# Patient Record
Sex: Male | Born: 1940 | Race: White | Hispanic: No | Marital: Married | Smoking: Former smoker
Health system: Southern US, Community
[De-identification: ages and names within clinical notes are randomized; demographics above are authoritative.]

## PROBLEM LIST (undated history)

## (undated) DIAGNOSIS — E785 Hyperlipidemia, unspecified: Secondary | ICD-10-CM

## (undated) DIAGNOSIS — I2510 Atherosclerotic heart disease of native coronary artery without angina pectoris: Secondary | ICD-10-CM

## (undated) DIAGNOSIS — R51 Headache: Secondary | ICD-10-CM

## (undated) DIAGNOSIS — I739 Peripheral vascular disease, unspecified: Secondary | ICD-10-CM

## (undated) DIAGNOSIS — I214 Non-ST elevation (NSTEMI) myocardial infarction: Secondary | ICD-10-CM

## (undated) DIAGNOSIS — R7303 Prediabetes: Secondary | ICD-10-CM

## (undated) DIAGNOSIS — C679 Malignant neoplasm of bladder, unspecified: Secondary | ICD-10-CM

## (undated) DIAGNOSIS — I10 Essential (primary) hypertension: Secondary | ICD-10-CM

## (undated) DIAGNOSIS — F32A Depression, unspecified: Secondary | ICD-10-CM

## (undated) DIAGNOSIS — D126 Benign neoplasm of colon, unspecified: Secondary | ICD-10-CM

## (undated) DIAGNOSIS — E039 Hypothyroidism, unspecified: Secondary | ICD-10-CM

## (undated) DIAGNOSIS — K635 Polyp of colon: Secondary | ICD-10-CM

## (undated) DIAGNOSIS — R519 Headache, unspecified: Secondary | ICD-10-CM

## (undated) DIAGNOSIS — I25810 Atherosclerosis of coronary artery bypass graft(s) without angina pectoris: Secondary | ICD-10-CM

## (undated) DIAGNOSIS — F329 Major depressive disorder, single episode, unspecified: Secondary | ICD-10-CM

## (undated) HISTORY — PX: COLONOSCOPY: SHX174

## (undated) HISTORY — PX: ANGIOPLASTY: SHX39

---

## 1990-09-27 HISTORY — PX: LEFT HEART CATH: SHX5946

## 2010-03-09 ENCOUNTER — Ambulatory Visit: Payer: Self-pay | Admitting: Gastroenterology

## 2011-03-05 ENCOUNTER — Ambulatory Visit: Payer: Self-pay | Admitting: Internal Medicine

## 2011-03-10 ENCOUNTER — Ambulatory Visit: Payer: Self-pay | Admitting: Internal Medicine

## 2015-05-20 ENCOUNTER — Encounter
Admission: RE | Disposition: A | Discharge status: Home / Self Care | Payer: Self-pay | Source: Ambulatory Visit | Attending: Gastroenterology

## 2015-05-20 ENCOUNTER — Ambulatory Visit: Payer: Medicare Other | Admitting: Anesthesiology

## 2015-05-20 ENCOUNTER — Encounter: Payer: Self-pay | Admitting: *Deleted

## 2015-05-20 ENCOUNTER — Ambulatory Visit
Admission: RE | Admit: 2015-05-20 | Discharge: 2015-05-20 | Disposition: A | Discharge status: Home / Self Care | Payer: Medicare Other | Source: Ambulatory Visit | Attending: Gastroenterology | Admitting: Gastroenterology

## 2015-05-20 DIAGNOSIS — K5730 Diverticulosis of large intestine without perforation or abscess without bleeding: Secondary | ICD-10-CM | POA: Diagnosis not present

## 2015-05-20 DIAGNOSIS — Z955 Presence of coronary angioplasty implant and graft: Secondary | ICD-10-CM | POA: Insufficient documentation

## 2015-05-20 DIAGNOSIS — F329 Major depressive disorder, single episode, unspecified: Secondary | ICD-10-CM | POA: Diagnosis not present

## 2015-05-20 DIAGNOSIS — I2510 Atherosclerotic heart disease of native coronary artery without angina pectoris: Secondary | ICD-10-CM | POA: Insufficient documentation

## 2015-05-20 DIAGNOSIS — D123 Benign neoplasm of transverse colon: Secondary | ICD-10-CM | POA: Insufficient documentation

## 2015-05-20 DIAGNOSIS — Z86010 Personal history of colonic polyps: Secondary | ICD-10-CM | POA: Insufficient documentation

## 2015-05-20 DIAGNOSIS — E785 Hyperlipidemia, unspecified: Secondary | ICD-10-CM | POA: Diagnosis not present

## 2015-05-20 DIAGNOSIS — D122 Benign neoplasm of ascending colon: Secondary | ICD-10-CM | POA: Diagnosis not present

## 2015-05-20 DIAGNOSIS — Z881 Allergy status to other antibiotic agents status: Secondary | ICD-10-CM | POA: Insufficient documentation

## 2015-05-20 DIAGNOSIS — K648 Other hemorrhoids: Secondary | ICD-10-CM | POA: Insufficient documentation

## 2015-05-20 DIAGNOSIS — Z79899 Other long term (current) drug therapy: Secondary | ICD-10-CM | POA: Insufficient documentation

## 2015-05-20 DIAGNOSIS — I10 Essential (primary) hypertension: Secondary | ICD-10-CM | POA: Diagnosis not present

## 2015-05-20 HISTORY — DX: Atherosclerotic heart disease of native coronary artery without angina pectoris: I25.10

## 2015-05-20 HISTORY — DX: Depression, unspecified: F32.A

## 2015-05-20 HISTORY — DX: Hyperlipidemia, unspecified: E78.5

## 2015-05-20 HISTORY — DX: Headache: R51

## 2015-05-20 HISTORY — DX: Headache, unspecified: R51.9

## 2015-05-20 HISTORY — DX: Major depressive disorder, single episode, unspecified: F32.9

## 2015-05-20 HISTORY — PX: COLONOSCOPY WITH PROPOFOL: SHX5780

## 2015-05-20 HISTORY — DX: Essential (primary) hypertension: I10

## 2015-05-20 HISTORY — DX: Polyp of colon: K63.5

## 2015-05-20 SURGERY — COLONOSCOPY WITH PROPOFOL
Anesthesia: General

## 2015-05-20 MED ORDER — SODIUM CHLORIDE 0.9 % IV SOLN
INTRAVENOUS | Status: DC
Start: 2015-05-20 — End: 2015-05-20

## 2015-05-20 MED ORDER — PROPOFOL 10 MG/ML IV BOLUS
INTRAVENOUS | Status: DC | PRN
  Administered 2015-05-20: 100 mg via INTRAVENOUS

## 2015-05-20 MED ORDER — SODIUM CHLORIDE 0.9 % IV SOLN
INTRAVENOUS | Status: DC
  Administered 2015-05-20: 1000 mL via INTRAVENOUS

## 2015-05-20 MED ORDER — PROPOFOL INFUSION 10 MG/ML OPTIME
INTRAVENOUS | Status: DC | PRN
  Administered 2015-05-20: 100 ug/kg/min via INTRAVENOUS

## 2015-05-20 MED ORDER — LIDOCAINE HCL (PF) 2 % IJ SOLN
INTRAMUSCULAR | Status: DC | PRN
  Administered 2015-05-20: 20 mg via INTRADERMAL

## 2015-05-20 NOTE — Transfer of Care (Signed)
Immediate Anesthesia Transfer of Care Note  Patient: Andrew Richardson  Procedure(s) Performed: Procedure(s): COLONOSCOPY WITH PROPOFOL (N/A)  Patient Location: Endoscopy Unit  Anesthesia Type:General  Level of Consciousness: sedated  Airway & Oxygen Therapy: Patient Spontanous Breathing and Patient connected to nasal cannula oxygen  Post-op Assessment: Report given to RN and Post -op Vital signs reviewed and stable  Post vital signs: Reviewed and stable  Last Vitals:  Filed Vitals:   05/20/15 1250  BP: 167/75  Pulse: 48  Temp: 36.1 C  Resp: 22    Complications: No apparent anesthesia complications

## 2015-05-20 NOTE — H&P (Signed)
Outpatient short stay form Pre-procedure 05/20/2015 1:02 PM Lollie Sails MD  Primary Physician: Dr. Baldemar Lenis  Reason for visit:  Colonoscopy  History of present illness:  Patient is a 74 year old male with a personal history of adenomatous colon polyps. His last colonoscopy was in 2011 with finding of 4 polyps. He tolerated his prep well overnight. He takes no aspirin products he takes no anticoagulation medications.  He states he does have a small left-sided inguinal hernia occasionally Trude and given some discomfort. He states he is planning getting that repaired this winter. It is currently reduced.    Current facility-administered medications:  .  0.9 %  sodium chloride infusion, , Intravenous, Continuous, Lollie Sails, MD .  0.9 %  sodium chloride infusion, , Intravenous, Continuous, Lollie Sails, MD  Prescriptions prior to admission  Medication Sig Dispense Refill Last Dose  . losartan-hydrochlorothiazide (HYZAAR) 100-25 MG per tablet Take 1 tablet by mouth daily.   05/19/2015 at Unknown time  . metoprolol (LOPRESSOR) 50 MG tablet Take 50 mg by mouth daily.   05/20/2015 at 0600  . simvastatin (ZOCOR) 40 MG tablet Take 40 mg by mouth daily.   05/19/2015 at Unknown time     Allergies  Allergen Reactions  . Tetracyclines & Related      Past Medical History  Diagnosis Date  . Coronary artery disease   . Hypertension   . Colon polyps   . Hyperlipemia   . Depression   . Headache     Review of systems:      Physical Exam    Heart and lungs: Regular rate and rhythm without rub or gallop lungs are bilaterally clear    HEENT: Normocephalic atraumatic eyes are anicteric    Other:     Pertinant exam for procedure: Soft nontender nondistended bowel sounds positive normoactive. There is no tenderness in the left inguinal area.    Planned proceedures: Colonoscopy and indicated procedures. I have discussed the risks benefits and complications of procedures to  include not limited to bleeding, infection, perforation and the risk of sedation and the patient wishes to proceed.    Lollie Sails, MD Gastroenterology 05/20/2015  1:02 PM

## 2015-05-20 NOTE — Anesthesia Preprocedure Evaluation (Signed)
Anesthesia Evaluation  Patient identified by MRN, date of birth, ID band Patient awake    Reviewed: Allergy & Precautions, H&P , NPO status , Patient's Chart, lab work & pertinent test results, reviewed documented beta blocker date and time   History of Anesthesia Complications Negative for: history of anesthetic complications  Airway Mallampati: I  TM Distance: >3 FB Neck ROM: full    Dental no notable dental hx. 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 Normal cardiovascular exam- dysrhythmias - Valvular Problems/MurmursRhythm:regular Rate:Normal     Neuro/Psych PSYCHIATRIC DISORDERS (depression) negative neurological ROS     GI/Hepatic negative GI ROS, Neg liver ROS,   Endo/Other  negative endocrine ROS  Renal/GU negative Renal ROS  negative genitourinary   Musculoskeletal   Abdominal   Peds  Hematology negative hematology ROS (+)   Anesthesia Other Findings Past Medical History:   Coronary artery disease                                      Hypertension                                                 Colon polyps                                                 Hyperlipemia                                                 Depression                                                   Headache                                                     Reproductive/Obstetrics negative OB ROS                             Anesthesia Physical Anesthesia Plan  ASA: II  Anesthesia Plan: General   Post-op Pain Management:    Induction:   Airway Management Planned:    Additional Equipment:   Intra-op Plan:   Post-operative Plan:   Informed Consent: I have reviewed the patients History and Physical, chart, labs and discussed the procedure including the risks, benefits and alternatives for the proposed  anesthesia with the patient or authorized representative who has indicated his/her understanding and acceptance.   Dental Advisory Given  Plan Discussed with: Anesthesiologist, CRNA and Surgeon  Anesthesia Plan Comments:         Anesthesia Quick Evaluation

## 2015-05-21 ENCOUNTER — Encounter: Payer: Self-pay | Admitting: Gastroenterology

## 2015-05-22 LAB — SURGICAL PATHOLOGY

## 2015-05-22 NOTE — Op Note (Signed)
[facility] Gastroenterology Patient Name: Andrew Richardson Procedure Date: 05/20/2015 12:41 PM MRN: [id number] Account #: [id number] Date of Birth: 1941/03/21 Admit Type: Outpatient Age: 73 Room: [facility] ENDO ROOM 3 Gender: Male Note Status: Finalized Procedure:         Colonoscopy Indications:       Personal history of colonic polyps Providers:         Lollie Sails, MD Referring MD:      Caprice Renshaw (Referring MD) Medicines:         Monitored Anesthesia Care Complications:     No immediate complications. Procedure:         Pre-Anesthesia Assessment:                    - ASA Grade Assessment: II - A patient with mild systemic                     disease.                    After obtaining informed consent, the colonoscope was                     passed under direct vision. Throughout the procedure, the                     patient's blood pressure, pulse, and oxygen saturations                     were monitored continuously. The Olympus PCF-H180AL                     colonoscope ( S#: [id number] ) was introduced through the                     anus and advanced to the the cecum, identified by                     appendiceal orifice and ileocecal valve. The quality of                     the bowel preparation was good. Findings:      Multiple small to medium diverticula were found in the sigmoid colon, in       the descending colon, in the transverse colon and in the ascending colon.      A 7 mm polyp was found at the hepatic flexure. The polyp was flat. The       polyp was removed with a cold snare. Resection and retrieval were       complete.      A 3 mm polyp was found in the ascending colon. The polyp was sessile.       The polyp was removed with a cold biopsy forceps. Resection and       retrieval were complete.      Non-bleeding internal hemorrhoids were found during retroflexion, during       digital exam and during anoscopy. The hemorrhoids were  medium-sized. Impression:        - Diverticulosis in the sigmoid colon, in the descending                     colon, in the transverse colon and in the ascending colon.                    -  One 7 mm polyp at the hepatic flexure. Resected and                     retrieved.                    - One 3 mm polyp in the ascending colon. Resected and                     retrieved.                    - Non-bleeding internal hemorrhoids. Recommendation:    - Await pathology results.                    - Telephone GI clinic for pathology results in 1 week. Procedure Code(s): --- Professional ---                    [id number], Colonoscopy, flexible; with removal of tumor(s),                     polyp(s), or other lesion(s) by snare technique                    45380, 23, Colonoscopy, flexible; with biopsy, single or                     multiple Diagnosis Code(s): --- Professional ---                    211.3, Benign neoplasm of colon                    455.0, Internal hemorrhoids without mention of complication                    V12.72, Personal history of colonic polyps                    562.10, Diverticulosis of colon (without mention of                     hemorrhage) CPT copyright 2014 American Medical Association. All rights reserved. The codes documented in this report are preliminary and upon coder review may  be revised to meet current compliance requirements. Lollie Sails, MD 05/20/2015 2:11:43 PM This report has been signed electronically. Number of Addenda: 0 Note Initiated On: 05/20/2015 12:41 PM Scope Withdrawal Time: 0 hours 15 minutes 28 seconds  Total Procedure Duration: 0 hours 37 minutes 52 seconds       [facility]

## 2015-05-22 NOTE — Anesthesia Postprocedure Evaluation (Signed)
  Anesthesia Post-op Note  Patient: Andrew Richardson  Procedure(s) Performed: Procedure(s): COLONOSCOPY WITH PROPOFOL (N/A)  Anesthesia type:General  Patient location: PACU  Post pain: Pain level controlled  Post assessment: Post-op Vital signs reviewed, Patient's Cardiovascular Status Stable, Respiratory Function Stable, Patent Airway and No signs of Nausea or vomiting  Post vital signs: Reviewed and stable  Last Vitals:  Filed Vitals:   05/20/15 1440  BP: 121/77  Pulse: 41  Temp:   Resp: 18    Level of consciousness: awake, alert  and patient cooperative  Complications: No apparent anesthesia complications

## 2015-10-23 ENCOUNTER — Encounter
Admission: RE | Admit: 2015-10-23 | Discharge: 2015-10-23 | Disposition: A | Discharge status: Home / Self Care | Payer: Medicare Other | Source: Ambulatory Visit | Attending: Surgery | Admitting: Surgery

## 2015-10-23 DIAGNOSIS — Z01812 Encounter for preprocedural laboratory examination: Secondary | ICD-10-CM | POA: Insufficient documentation

## 2015-10-23 DIAGNOSIS — K4090 Unilateral inguinal hernia, without obstruction or gangrene, not specified as recurrent: Secondary | ICD-10-CM | POA: Insufficient documentation

## 2015-10-23 HISTORY — DX: Benign neoplasm of colon, unspecified: D12.6

## 2015-10-23 HISTORY — DX: Malignant neoplasm of bladder, unspecified: C67.9

## 2015-10-23 LAB — CBC
HEMATOCRIT: 42.2 % (ref 40.0–52.0)
Hemoglobin: 14.3 g/dL (ref 13.0–18.0)
MCH: 34 pg (ref 26.0–34.0)
MCHC: 33.9 g/dL (ref 32.0–36.0)
MCV: 100.2 fL — AB (ref 80.0–100.0)
PLATELETS: 176 10*3/uL (ref 150–440)
RBC: 4.21 MIL/uL — ABNORMAL LOW (ref 4.40–5.90)
RDW: 13.3 % (ref 11.5–14.5)
WBC: 5.3 10*3/uL (ref 3.8–10.6)

## 2015-10-23 LAB — DIFFERENTIAL
BASOS PCT: 1 %
Basophils Absolute: 0.1 10*3/uL (ref 0–0.1)
Eosinophils Absolute: 0.1 10*3/uL (ref 0–0.7)
Eosinophils Relative: 2 %
Lymphocytes Relative: 32 %
Lymphs Abs: 1.7 10*3/uL (ref 1.0–3.6)
MONO ABS: 0.6 10*3/uL (ref 0.2–1.0)
MONOS PCT: 12 %
NEUTROS ABS: 2.8 10*3/uL (ref 1.4–6.5)
Neutrophils Relative %: 53 %

## 2015-10-23 LAB — BASIC METABOLIC PANEL
Anion gap: 9 (ref 5–15)
BUN: 17 mg/dL (ref 6–20)
CHLORIDE: 98 mmol/L — AB (ref 101–111)
CO2: 28 mmol/L (ref 22–32)
Calcium: 9.4 mg/dL (ref 8.9–10.3)
Creatinine, Ser: 0.82 mg/dL (ref 0.61–1.24)
GFR calc Af Amer: >60 mL/min (ref >60)
GFR calc non Af Amer: >60 mL/min (ref >60)
GLUCOSE: 119 mg/dL — AB (ref 65–99)
POTASSIUM: 3.9 mmol/L (ref 3.5–5.1)
Sodium: 135 mmol/L (ref 135–145)

## 2015-10-23 NOTE — Patient Instructions (Signed)
  Your procedure is scheduled on: Friday 10/31/15 Report to Day Surgery. 2ND FLOOR MEDICAL MALL ENTRANCE To find out your arrival time please call [phone] between 1PM - 3PM on Thursday 10/30/15.  Remember: Instructions that are not followed completely may result in serious medical risk, up to and including death, or upon the discretion of your surgeon and anesthesiologist your surgery may need to be rescheduled.    __X__ 1. Do not eat food or drink liquids after midnight. No gum chewing or hard candies.     __X__ 2. No Alcohol for 24 hours before or after surgery.   ____ 3. Bring all medications with you on the day of surgery if instructed.    __X__ 4. Notify your doctor if there is any change in your medical condition     (cold, fever, infections).     Do not wear jewelry, make-up, hairpins, clips or nail polish.  Do not wear lotions, powders, or perfumes.   Do not shave 48 hours prior to surgery. Men may shave face and neck.  Do not bring valuables to the hospital.    [facility] is not responsible for any belongings or valuables.               Contacts, dentures or bridgework may not be worn into surgery.  Leave your suitcase in the car. After surgery it may be brought to your room.  For patients admitted to the hospital, discharge time is determined by your                treatment team.   Patients discharged the day of surgery will not be allowed to drive home.   Please read over the following fact sheets that you were given:   Surgical Site Infection Prevention   __X__ Take these medicines the morning of surgery with A SIP OF WATER:    1. LOSARTAN  2. METOPROLOL  3.   4.  5.  6.  ____ Fleet Enema (as directed)   __X__ Use CHG Soap as directed  ____ Use inhalers on the day of surgery  ____ Stop metformin 2 days prior to surgery    ____ Take 1/2 of usual insulin dose the night before surgery and none on the morning of surgery.   __X__ Stop  Coumadin/Plavix/aspirin on TODAY  ____ Stop Anti-inflammatories on    ____ Stop supplements until after surgery.    ____ Bring C-Pap to the hospital.

## 2015-10-23 NOTE — Pre-Procedure Instructions (Signed)
RE: Anesthesia  Andrew Cowman, MD - 10/22/2015 2:30 PM EST Formatting of this note may be different from the original. Established Patient Visit   Chief Complaint: Chief Complaint  Patient presents with  . Pre Op Consulting  per Dr Tamala Julian Hernia  . Edema  at the end of the day  Date of Service: 10/22/2015 Date of Birth: July 23, 1941 PCP: Barnabas Lister, MD  History of Present Illness: Andrew Richardson is a 75 y.o.male patient who returns for  1. Status post PTCA 1992 at [facility] 2. Status post PTCA 1998 at [facility] 3. Essential hypertension 4. Hyperlipidemia 5. PVCs  Patient returns for preoperative cardiovascular evaluation prior to left inguinal hernia repair scheduled for 10/31/2015 by Dr. Tamala Julian. The patient denies chest pain. He has mild chronic exertional dyspnea. He has occasional palpitations due to underlying PVCs. The patient denies peripheral edema. The patient is active for his age but does not do any structured exercise. 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  The patient has essential hypertension, systolic blood pressure mildly elevated, currently on losartan, hydrochlorothiazide, and metoprolol tartrate, which are well tolerated without apparent side effects. The patient follows a low-sodium, no added salt diet.  The patient's hyperlipidemia, LDL cholesterol 68 on 09/08/2015, currently on simvastatin, which is well tolerated without apparent side effects, followed by his primary care provider. The patient follows a low-cholesterol, low-fat diet.  Past Medical and Surgical History  Past Medical History Past Medical History  Diagnosis Date  . Benign neoplasm of colon  . Colon polyp  . Coronary atherosclerosis of native coronary artery  . Depression  . Diverticulosis 05/20/2015  . History of colonic polyps  . Hyperlipidemia  . Hypertension  . Internal hemorrhoids 05/20/2015  . Migraines  . 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  . Tubular adenoma of colon 05/20/2015   Past Surgical History He has a past surgical history that includes Colonoscopy (03/09/2010) and Colonoscopy (05/20/2015).   Medications and Allergies  Current Medications  Current Outpatient Prescriptions  Medication Sig Dispense Refill  . aspirin 81 MG EC tablet Take 81 mg by mouth every other day.   . hydrochlorothiazide (HYDRODIURIL) 25 MG tablet Take 1 tablet (25 mg total) by mouth once daily. 30 tablet 11  . losartan (COZAAR) 100 MG tablet Take 1 tablet (100 mg total) by mouth once daily. 30 tablet 11  . metoprolol tartrate (LOPRESSOR) 50 MG tablet Take 1 tablet (50 mg total) by mouth 2 (two) times daily. 180 tablet 1  . simvastatin (ZOCOR) 40 MG tablet Take 1 tablet (40 mg total) by mouth nightly. 90 tablet 1  . tamsulosin (FLOMAX) 0.4 mg capsule Start one week prior to surgery and three weeks after. Take 30 minutes after same meal each day. (Patient not taking: Reported on 10/22/2015 ) 7 capsule 0   No current facility-administered medications for this visit.   Allergies: Tetracycline and Other  Social and Family History  Social History reports that he quit smoking about 25 years ago. He has never used smokeless tobacco. He reports that he drinks alcohol. He reports that he does not use illicit drugs.  Family History Family History  Problem Relation Age of Onset  . Coronary artery disease Mother  . Coronary artery disease Father  Coronary artery bypass surgery  . Colon cancer Neg Hx  . Colon polyps Neg Hx  . Liver disease Neg Hx  . Rectal cancer Neg Hx  .  Ulcers Neg Hx   Review of Systems   Review of Systems: The patient denies chest pain, reports mild exertional shortness of breath, without orthopnea, paroxysmal nocturnal dyspnea, pedal edema, with occasional palpitations, heart racing, without presyncope, syncope. Review of 12 Systems is negative except as described  above.  Physical Examination   Vitals: Visit Vitals  . BP 142/62  . Pulse 56  . Ht 172.7 cm (5\' 8" )  . Wt 90.4 kg (199 lb 3.2 oz)  . BMI 30.29 kg/m2   Ht:172.7 cm (5\' 8" ) Wt:90.4 kg (199 lb 3.2 oz) [id number] surface area is 2.08 meters squared. Body mass index is 30.29 kg/(m^2).  HEENT: Pupils equally reactive to light and accomodation  Neck: Supple without thyromegaly, carotid pulses 2+ Lungs: clear to auscultation bilaterally; no wheezes, rales, rhonchi Heart: Regular rate and rhythm. No gallops, murmurs or rub Abdomen: soft nontender, nondistended, with normal bowel sounds Extremities: no cyanosis, clubbing, or edema Peripheral Pulses: 2+ in all extremities, 2+ femoral pulses bilaterally  Assessment   75 y.o. male with  1. Preoperative cardiovascular examination  2. Atherosclerosis of native coronary artery of native heart without angina pectoris  3. Essential hypertension with goal blood pressure less than 140/90  4. H/O angioplasty  5. Pure hypercholesterolemia   75 year old gentleman with known coronary disease as stated above, currently without chest pain, awaiting left inguinal hernia repair surgery. Patient has essential hypertension, systolic blood pressure mildly elevated on current BP medications. The patient's hyperlipidemia, with excellent control of LDL cholesterol on simvastatin.  Plan   1. Continue current medications 2. Counseled patient about low-sodium diet 3. DASH diet printed instructions given to the patient 4. Counseled patient about low-cholesterol diet 5. Continue simvastatin for hyperlipidemia management  6. Low-fat and cholesterol diet printed instructions given to the patient 7. Stress echocardiogram 8. Return to clinic after stress echocardiogram  Orders Placed This Encounter  Procedures  . ECG 12-lead  . ECG stress test only  . Echocardiogram stress test   Return in about 2 weeks (around 11/05/2015), or after stress echo.  Andrew Cowman, MD    Plan of Treatment - as of this encounter Upcoming Encounters Upcoming Encounters  Date Type Specialty Care Team Description  10/28/2015 Appointment Cardiology Paraschos, Sheppard Coil, MD  [facility]  [place], [place] [address]  [phone]  [phone] ([phone]    10/29/2015 Appointment Cardiology Paraschos, Sheppard Coil, MD  [place]  [facility]  [place], [place] [address]  [phone]  [phone] (Fax)    11/13/2015 Appointment General Surgery Delrae Alfred., MD  [facility]  [place], [place] [address]  [phone]  [phone] (Fax)    04/01/2016 Appointment Lab Barnabas Lister, MD  [phone] [facility] and Internal Medicine  [place], [place] [address]  [phone]  [phone] (Fax)    04/08/2016 Appointment Internal Medicine Baldemar Lenis, Jacqulyn Liner, MD  [phone] [address]   [facility] and Internal Medicine  [place], [place] [address]  [phone]  [phone] (Fax)     Pending Results Pending Results  Name Priority Associated Diagnoses Date/Time  ECG 12-lead Routine Preoperative cardiovascular examination  10/22/2015 2:48 PM EST   Scheduled Tests Scheduled Tests  Name Priority Associated Diagnoses Order Schedule  ECG stress test only Routine Preoperative cardiovascular examination  Atherosclerosis of native coronary artery of native heart without angina pectoris  H/O angioplasty  1 Occurrences starting 10/22/2015 until  02/19/2016  Echocardiogram stress test Routine Preoperative cardiovascular examination  Atherosclerosis of native coronary artery of native heart without angina pectoris  H/O angioplasty  1 Occurrences starting 10/22/2015 until 10/22/2016  Visit Diagnoses - in this encounter Diagnosis  Preoperative cardiovascular examination - Primary  Pre-operative  cardiovascular examination   Atherosclerosis of native coronary artery of native heart without angina pectoris  Essential hypertension with goal blood pressure less than 140/90  H/O angioplasty  Pure hypercholesterolemia  Discontinued Medications - as of this encounter Prescription Sig. Discontinue Reason Start Date End Date  fluorouracil (EFUDEX) 5 % cream  Apply topically. Reported on 10/22/2015 Other 05/05/2015 10/22/2015  Document Information Primary Care Provider Jacqulyn Liner [facility] (Apr. 11, 2015 - Present) [phone] (Work) [phone] (Fax)  [address] and Internal Medicine [place], [place] [address] Document Coverage Dates Jan. 25, 2017 - Jan. 25, 2017 [place] [phone] (Work) [place], [place] [address] Encounter Providers Andrew Richardson (Attending) [phone] (Work) [phone] (Fax)  [facility] [place], [place] [address]

## 2015-10-27 MED ORDER — FENTANYL CITRATE (PF) 100 MCG/2ML IJ SOLN
INTRAMUSCULAR | Status: AC
  Filled 2015-10-27: qty 2

## 2015-10-29 NOTE — Pre-Procedure Instructions (Signed)
[place]  Component Name Value Range  Vent Rate (bpm) 56   PR Interval (msec) 226   QRS Interval (msec) 108   QT Interval (msec) 456   QTc (msec) 440    Result Narrative  Sinus bradycardia 1st degree AV block with occasional premature ventricular complexes Cannot rule out Anterior infarct , age undetermined Abnormal ECG No previous ECGs available I reviewed and concur with this report. Electronically signed [id number], MD, ALEX ([id number]) on 10/27/2015 10:41:43 AM       LV Ejection Fraction (%) 55   Aortic Valve Regurgitation Grade none   Aortic Valve Stenosis Grade none   Mitral Valve Regurgitation Grade trivial   Mitral Valve Stenosis Grade none   Tricuspid Valve Regurgitation Grade mild   Tricuspid Valve Regurgitation Max Velocity (m/s) 2.1 m/sec   Right Ventricle Systolic Pressure (mmHg) [id number] mmHg    Result Narrative                     CARDIOLOGY DEPARTMENT                        Andrew Richardson, Andrew Richardson                           [id number]                  [facility]                       Acct #: [id number]        [address], [place], [place] [address]             Date: 10/28/2015 02:31 PM                                                                 Adult Male Age: 75 yrs                    ECHOCARDIOGRAM REPORT                        Outpatient          STUDY:Stress Echo         TAPE:                        [id number]::[id number]           ECHO:Yes   DOPPLER:Yes   FILE:[id number]            MD1:  PARASCHOS, ALEXANDER          COLOR:Yes  CONTRAST:No      MACHINE:Philips      RV BIOPSY:No         3D:No   SOUND QLTY:Moderate         MEDIUM:None ___________________________________________________________________________________________     HISTORY:Coronary Artery Disease     REASON:Assess, LV function Indication:Z01.810 Preoperative cardiovascular examination, I25.10 Atherosclerotic  heart disease of  native coronary artery ___________________________________________________________________________________________  STRESS ECHOCARDIOGRAPHY           Protocol:Treadmill (Bruce)             Drugs:None Target Heart Rate:122 bpm        Maximum Predicted Heart Rate: 144 bpm  +-------------------+-------------------------+-------------------------+------------+        Stage            Duration (mm:ss)         Heart Rate (bpm)         BP      +-------------------+-------------------------+-------------------------+------------+       RESTING                                           52              142/73    +-------------------+-------------------------+-------------------------+------------+       EXERCISE                7:16                     157                /       +-------------------+-------------------------+-------------------------+------------+       RECOVERY                8:09                      76              188/84    +-------------------+-------------------------+-------------------------+------------+    Stress Duration:7:16 mm:ss   Max Stress H.R.:157 bpm        Target Heart Rate Achieved: Yes  ___________________________________________________________________________________________  WALL SEGMENT CHANGES                        Rest          Stress Anterior Septum Basal:Normal        Hyperkinetic                   [id number]        Hyperkinetic                Apical:Normal        Hyperkinetic    Anterior Wall Basal:Normal        Hyperkinetic                   [id number]        Hyperkinetic                Apical:Normal        Hyperkinetic     Lateral Wall Basal:Normal        Hyperkinetic                   [id number]        Hyperkinetic                Apical:Normal        Hyperkinetic   Posterior Wall Basal:Normal        Hyperkinetic                   [id number]        Hyperkinetic  Inferior Wall Basal:Normal        Hyperkinetic                    [id number]        Hyperkinetic                Apical:Normal        Hyperkinetic  Inferior Septum Basal:Normal        Hyperkinetic                   [id number]        Hyperkinetic             Resting EF:>55% (Est.)   Stress EF: >55% (Est.)  ___________________________________________________________________________________________  ADDITIONAL FINDINGS    Chest Discomfort:None         Arrhythmia:None Termination Reason:Fatigue    Adverse Effects:None      Complications:None  ___________________________________________________________________________________________ STRESS ECG RESULTS                                               ECG Results:Normal   ___________________________________________________________________________________________ ECHOCARDIOGRAPHIC DESCRIPTIONS  LEFT VENTRICLE         Size:Normal  Contraction:Normal    LV Masses:No Masses          [id number] Dias.FxClass:Normal  RIGHT VENTRICLE         Size:Normal               Free Wall:Normal  Contraction:Normal               RV Masses:No mass  PERICARDIUM        Fluid:No effusion   _______________________________________________________________________________________ DOPPLER ECHO and OTHER SPECIAL PROCEDURES    Aortic:No AR                      No AS     Mitral:TRIVIAL MR                 No MS           MV Inflow E Vel=nm*        MV Annulus E'Vel=nm*           E/E'Ratio=nm*  Tricuspid:MILD TR                    No TS           213.0 cm/sec peak TR vel   23.1 mmHg peak RV pressure  Pulmonary:No PR                      No PS    ___________________________________________________________________________________________ ECHOCARDIOGRAPHIC MEASUREMENTS 2D DIMENSIONS AORTA             Values      Normal Range      MAIN PA          Values      Normal Range           Annulus:  nm*       [2.3 - 2.9]                PA Main:  nm*       [1.5 - 2.1]         Aorta Sin:  nm*       [3.1 - 3.7]  RIGHT VENTRICLE       ST Junction:  nm*       [2.6 - 3.2]                RV Base:  nm*       [ < 4.2]         Asc.Aorta:  nm*       [2.6 - 3.4]                 RV Mid:  nm*       [ < 3.5]  LEFT VENTRICLE                                        RV Length:  nm*       [ < 8.6]             LVIDd:  nm*       [4.2 - 5.9]       INFERIOR VENA CAVA             LVIDs:  nm*                                 Max. IVC:  nm*       [ <= 2.1]                FS:  nm*       [> 25]                    Min. IVC:  nm*               SWT:  nm*       [0.6 - 1.0]                   ------------------               PWT:  nm*       [0.6 - 1.0]                   nm* - not measured  LEFT ATRIUM           LA Diam:  nm*       [3.0 - 4.0]       LA A4C Area:  nm*       [ < 20]         LA Volume:  nm*       [18 - 58]  ___________________________________________________________________________________________  INTERPRETATION Normal Stress Echocardiogram NORMAL RIGHT VENTRICULAR SYSTOLIC FUNCTION MILD VALVULAR REGURGITATION (See above) NO VALVULAR STENOSIS NOTED   ___________________________________________________________________________________________ Electronically signed by: MD Miquel Dunn on 10/28/2015 05:18 PM             Performed By: Johnathan Hausen, RDCS, RVT       Ordering Physician: Isaias Cowman ___________________________________________________________________________________________

## 2015-10-31 ENCOUNTER — Ambulatory Visit: Payer: Medicare Other | Admitting: *Deleted

## 2015-10-31 ENCOUNTER — Ambulatory Visit
Admission: RE | Admit: 2015-10-31 | Discharge: 2015-10-31 | Disposition: A | Discharge status: Home / Self Care | Payer: Medicare Other | Source: Ambulatory Visit | Attending: Surgery | Admitting: Surgery

## 2015-10-31 ENCOUNTER — Encounter: Payer: Self-pay | Admitting: *Deleted

## 2015-10-31 ENCOUNTER — Encounter
Admission: RE | Disposition: A | Discharge status: Home / Self Care | Payer: Self-pay | Source: Ambulatory Visit | Attending: Surgery

## 2015-10-31 DIAGNOSIS — F329 Major depressive disorder, single episode, unspecified: Secondary | ICD-10-CM | POA: Insufficient documentation

## 2015-10-31 DIAGNOSIS — Z8551 Personal history of malignant neoplasm of bladder: Secondary | ICD-10-CM | POA: Diagnosis not present

## 2015-10-31 DIAGNOSIS — K5790 Diverticulosis of intestine, part unspecified, without perforation or abscess without bleeding: Secondary | ICD-10-CM | POA: Insufficient documentation

## 2015-10-31 DIAGNOSIS — E785 Hyperlipidemia, unspecified: Secondary | ICD-10-CM | POA: Insufficient documentation

## 2015-10-31 DIAGNOSIS — Z86010 Personal history of colonic polyps: Secondary | ICD-10-CM | POA: Insufficient documentation

## 2015-10-31 DIAGNOSIS — E669 Obesity, unspecified: Secondary | ICD-10-CM | POA: Diagnosis not present

## 2015-10-31 DIAGNOSIS — K4090 Unilateral inguinal hernia, without obstruction or gangrene, not specified as recurrent: Secondary | ICD-10-CM | POA: Diagnosis not present

## 2015-10-31 DIAGNOSIS — Z7982 Long term (current) use of aspirin: Secondary | ICD-10-CM | POA: Diagnosis not present

## 2015-10-31 DIAGNOSIS — Z79899 Other long term (current) drug therapy: Secondary | ICD-10-CM | POA: Diagnosis not present

## 2015-10-31 DIAGNOSIS — Z87891 Personal history of nicotine dependence: Secondary | ICD-10-CM | POA: Insufficient documentation

## 2015-10-31 DIAGNOSIS — Z8249 Family history of ischemic heart disease and other diseases of the circulatory system: Secondary | ICD-10-CM | POA: Diagnosis not present

## 2015-10-31 DIAGNOSIS — Z955 Presence of coronary angioplasty implant and graft: Secondary | ICD-10-CM | POA: Diagnosis not present

## 2015-10-31 DIAGNOSIS — Z881 Allergy status to other antibiotic agents status: Secondary | ICD-10-CM | POA: Insufficient documentation

## 2015-10-31 DIAGNOSIS — Z9889 Other specified postprocedural states: Secondary | ICD-10-CM | POA: Insufficient documentation

## 2015-10-31 DIAGNOSIS — Z6830 Body mass index (BMI) 30.0-30.9, adult: Secondary | ICD-10-CM | POA: Diagnosis not present

## 2015-10-31 DIAGNOSIS — I10 Essential (primary) hypertension: Secondary | ICD-10-CM | POA: Diagnosis not present

## 2015-10-31 DIAGNOSIS — I2510 Atherosclerotic heart disease of native coronary artery without angina pectoris: Secondary | ICD-10-CM | POA: Diagnosis not present

## 2015-10-31 HISTORY — PX: INGUINAL HERNIA REPAIR: SHX194

## 2015-10-31 SURGERY — REPAIR, HERNIA, INGUINAL, ADULT
Anesthesia: General | Site: Abdomen | Laterality: Left | Wound class: Clean

## 2015-10-31 MED ORDER — EPHEDRINE SULFATE 50 MG/ML IJ SOLN
INTRAMUSCULAR | Status: DC | PRN
  Administered 2015-10-31: 10 mg via INTRAVENOUS
  Administered 2015-10-31: 5 mg via INTRAVENOUS

## 2015-10-31 MED ORDER — FAMOTIDINE 20 MG PO TABS
ORAL_TABLET | ORAL | Status: AC
  Administered 2015-10-31: 20 mg via ORAL
  Filled 2015-10-31: qty 1

## 2015-10-31 MED ORDER — MIDAZOLAM HCL 2 MG/2ML IJ SOLN
INTRAMUSCULAR | Status: DC | PRN
  Administered 2015-10-31: 2 mg via INTRAVENOUS

## 2015-10-31 MED ORDER — ONDANSETRON HCL 4 MG/2ML IJ SOLN: 4.0000 mg | Freq: Once | INTRAMUSCULAR | Status: DC | PRN

## 2015-10-31 MED ORDER — HYDROCODONE-ACETAMINOPHEN 5-325 MG PO TABS: 1.0000 | ORAL_TABLET | ORAL | Status: DC | PRN

## 2015-10-31 MED ORDER — FAMOTIDINE 20 MG PO TABS
20.0000 mg | ORAL_TABLET | Freq: Once | ORAL | Status: AC
  Administered 2015-10-31: 20 mg via ORAL

## 2015-10-31 MED ORDER — HYDROMORPHONE HCL 1 MG/ML IJ SOLN: 0.2500 mg | INTRAMUSCULAR | Status: DC | PRN

## 2015-10-31 MED ORDER — ONDANSETRON HCL 4 MG/2ML IJ SOLN
INTRAMUSCULAR | Status: DC | PRN
  Administered 2015-10-31: 4 mg via INTRAVENOUS

## 2015-10-31 MED ORDER — SUGAMMADEX SODIUM 200 MG/2ML IV SOLN
INTRAVENOUS | Status: DC | PRN
  Administered 2015-10-31: 180 mg via INTRAVENOUS

## 2015-10-31 MED ORDER — HYDROMORPHONE HCL 1 MG/ML IJ SOLN
0.2500 mg | INTRAMUSCULAR | Status: DC | PRN
  Administered 2015-10-31 (×4): 0.25 mg via INTRAVENOUS

## 2015-10-31 MED ORDER — LIDOCAINE HCL (CARDIAC) 20 MG/ML IV SOLN
INTRAVENOUS | Status: DC | PRN
  Administered 2015-10-31: 60 mg via INTRAVENOUS

## 2015-10-31 MED ORDER — PROPOFOL 10 MG/ML IV BOLUS
INTRAVENOUS | Status: DC | PRN
  Administered 2015-10-31: 160 mg via INTRAVENOUS

## 2015-10-31 MED ORDER — ROCURONIUM BROMIDE 100 MG/10ML IV SOLN
INTRAVENOUS | Status: DC | PRN
  Administered 2015-10-31: 35 mg via INTRAVENOUS

## 2015-10-31 MED ORDER — FENTANYL CITRATE (PF) 100 MCG/2ML IJ SOLN
INTRAMUSCULAR | Status: DC | PRN
  Administered 2015-10-31 (×2): 50 ug via INTRAVENOUS

## 2015-10-31 MED ORDER — BUPIVACAINE-EPINEPHRINE (PF) 0.5% -1:200000 IJ SOLN
INTRAMUSCULAR | Status: AC
  Filled 2015-10-31: qty 30

## 2015-10-31 MED ORDER — HYDROMORPHONE HCL 1 MG/ML IJ SOLN
INTRAMUSCULAR | Status: AC
  Filled 2015-10-31: qty 1

## 2015-10-31 MED ORDER — CEFAZOLIN SODIUM-DEXTROSE 2-3 GM-% IV SOLR
2.0000 g | Freq: Once | INTRAVENOUS | Status: AC
  Administered 2015-10-31: 2 g via INTRAVENOUS

## 2015-10-31 MED ORDER — CEFAZOLIN SODIUM-DEXTROSE 2-3 GM-% IV SOLR
INTRAVENOUS | Status: AC
  Administered 2015-10-31: 2 g via INTRAVENOUS
  Filled 2015-10-31: qty 50

## 2015-10-31 MED ORDER — BUPIVACAINE-EPINEPHRINE (PF) 0.5% -1:200000 IJ SOLN
INTRAMUSCULAR | Status: DC | PRN
  Administered 2015-10-31: 20 mL

## 2015-10-31 MED ORDER — LACTATED RINGERS IV SOLN
INTRAVENOUS | Status: DC
Start: 2015-10-31 — End: 2015-10-31
  Administered 2015-10-31: 13:00:00 via INTRAVENOUS

## 2015-10-31 SURGICAL SUPPLY — 26 items
BLADE CLIPPER SURG (BLADE) ×3 IMPLANT
BLADE SURG 15 STRL LF DISP TIS (BLADE) ×1 IMPLANT
BLADE SURG 15 STRL SS (BLADE) ×2
CANISTER SUCT 1200ML W/VALVE (MISCELLANEOUS) ×3 IMPLANT
CHLORAPREP W/TINT 26ML (MISCELLANEOUS) ×3 IMPLANT
DRAIN PENROSE 5/8X18 LTX STRL (WOUND CARE) ×3 IMPLANT
DRAPE LAPAROTOMY 77X122 PED (DRAPES) ×3 IMPLANT
ELECT REM PT RETURN 9FT ADLT (ELECTROSURGICAL) ×3
ELECTRODE REM PT RTRN 9FT ADLT (ELECTROSURGICAL) ×1 IMPLANT
GLOVE BIO SURGEON STRL SZ7.5 (GLOVE) ×15 IMPLANT
GOWN STRL REUS W/ TWL LRG LVL3 (GOWN DISPOSABLE) ×3 IMPLANT
GOWN STRL REUS W/TWL LRG LVL3 (GOWN DISPOSABLE) ×6
KIT RM TURNOVER STRD PROC AR (KITS) ×3 IMPLANT
LABEL OR SOLS (LABEL) ×3 IMPLANT
LIQUID BAND (GAUZE/BANDAGES/DRESSINGS) ×3 IMPLANT
MESH SYNTHETIC 4X6 SOFT BARD (Mesh General) ×1 IMPLANT
MESH SYNTHETIC SOFT BARD 4X6 (Mesh General) ×2 IMPLANT
NEEDLE HYPO 25X1 1.5 SAFETY (NEEDLE) ×3 IMPLANT
NS IRRIG 500ML POUR BTL (IV SOLUTION) ×3 IMPLANT
PACK BASIN MINOR ARMC (MISCELLANEOUS) ×3 IMPLANT
SUT CHROMIC 4 0 RB 1X27 (SUTURE) ×3 IMPLANT
SUT MNCRL AB 4-0 PS2 18 (SUTURE) ×3 IMPLANT
SUT SURGILON 0 30 BLK (SUTURE) ×6 IMPLANT
SUT VIC AB 4-0 SH 27 (SUTURE) ×2
SUT VIC AB 4-0 SH 27XANBCTRL (SUTURE) ×1 IMPLANT
SYRINGE 10CC LL (SYRINGE) ×3 IMPLANT

## 2015-10-31 NOTE — H&P (Signed)
  He reports no change in condition since the day of the office visit.  He has been off his aspirin for 10 days. He has been taking Flomax for 7 days.  The left side was marked YES  I discussed the plan for left inguinal hernia repair

## 2015-10-31 NOTE — Progress Notes (Signed)
°   10/31/15 1300  Clinical Encounter Type  Visited With Family  Visit Type Initial  Referral From Nurse  Consult/Referral To Chaplain  Spiritual Encounters  Spiritual Needs Other (Comment)  AD education.  Left documents with spouse to be completed. [place]

## 2015-10-31 NOTE — Progress Notes (Signed)
°   10/31/15 1400  Clinical Encounter Type  Visited With Patient and family together  Visit Type Follow-up  Referral From Nurse  Consult/Referral To Chaplain  Spiritual Encounters  Spiritual Needs Other (Comment)  Paged to look over AD documents and finalize if possible.  Patient was taken to surgery before documents could be notarized. [place]

## 2015-10-31 NOTE — Discharge Instructions (Signed)
Take Tylenol or Norco if needed for pain.  May resume aspirin on Sunday.  May shower.  Avoid straining and heavy lifting. AMBULATORY SURGERY  DISCHARGE INSTRUCTIONS   1) The drugs that you were given will stay in your system until tomorrow so for the next 24 hours you should not:  A) Drive an automobile B) Make any legal decisions C) Drink any alcoholic beverage   2) You may resume regular meals tomorrow.  Today it is better to start with liquids and gradually work up to solid foods.  You may eat anything you prefer, but it is better to start with liquids, then soup and crackers, and gradually work up to solid foods.   3) Please notify your doctor immediately if you have any unusual bleeding, trouble breathing, redness and pain at the surgery site, drainage, fever, or pain not relieved by medication.    4) Additional Instructions:   Please contact your physician with any problems or Same Day Surgery at [phone], Monday through Friday 6 am to 4 pm, or [place] at [facility] number at [phone].

## 2015-10-31 NOTE — Op Note (Signed)
OPERATIVE REPORT  PREOPERATIVE DIAGNOSIS: left inguinal hernia  POSTOPERATIVE DIAGNOSIS:left  inguinal hernia  PROCEDURE:  left inguinal hernia repair  ANESTHESIA:  General  SURGEON:  Rochel Brome M.D.  INDICATIONS: He reports recent bulging in the left groin. A left inguinal hernia was demonstrated on physical exam and repair was recommended for definitive treatment.  With the patient on the operating table in the supine position the left lower quadrant was prepared with clippers and with ChloraPrep and draped in a sterile manner. A transversely oriented suprapubic incision was made and carried down through subcutaneous tissues. Electrocautery was used for hemostasis. The Scarpa's fascia was incised. The external oblique aponeurosis was incised along the course of its fibers to open the external ring and expose the inguinal cord structures. The cord structures were mobilized. A Penrose drain was passed around the cord structures for traction. Cremaster fibers were spread to expose an indirect hernia sac which was approximately 6 cm in length and was dissected free from surrounding structures up into the internal ring. The sac was opened. Its continuity with the peritoneal cavity was demonstrated. A high ligation of the sac was done with a 0 Surgilon suture ligature and the sac was excised. The stump was allowed to retract. The floor of the inguinal canal was repaired with 0 Surgilon sutures suturing the conjoined tendon to the shelving edge of the inguinal ligament incorporating transversalis fascia into the repair. The last stitch led to satisfactory narrowing of the internal ring. Bard soft mesh was cut to create an oval shape and was placed over the repair. This was sutured to the repair with interrupted 0 Surgilon sutures and also sutured medially to the deep fascia and on both sides of the internal ring. Next after seeing hemostasis was intact the cord structures were replaced along the floor of  the inguinal canal. The cut edges of the external oblique aponeurosis were closed with a running 4-0 Vicryl suture to re-create the external ring. The deep fascia superior and lateral to the repair site was infiltrated with half percent Sensorcaine with epinephrine. Subcutaneous tissues were also infiltrated. The Scarpa's fascia was closed with interrupted 4-0 Vicryl sutures. The skin was closed with running 4-0 Monocryl subcuticular suture and LiquiBand. The testicle remained in the scrotum  The patient appeared to be in satisfactory condition and was prepared for transfer to the recovery room.  Rochel Brome M.D.

## 2015-10-31 NOTE — Transfer of Care (Signed)
Immediate Anesthesia Transfer of Care Note  Patient: Andrew Richardson  Procedure(s) Performed: Procedure(s): HERNIA REPAIR INGUINAL ADULT (Left)  Patient Location: PACU  Anesthesia Type:General  Level of Consciousness: awake and patient cooperative  Airway & Oxygen Therapy: Patient Spontanous Breathing and Patient connected to face mask oxygen  Post-op Assessment: Report given to RN  Post vital signs: Reviewed and stable  Last Vitals:  Filed Vitals:   10/31/15 1241 10/31/15 1629  BP: 148/84 150/70  Pulse: 56 59  Temp: 36.4 C 36.7 C  Resp: 14 16    Complications: No apparent anesthesia complications

## 2015-10-31 NOTE — Anesthesia Preprocedure Evaluation (Signed)
Anesthesia Evaluation  Patient identified by MRN, date of birth, ID band Patient awake    Reviewed: Allergy & Precautions, NPO status , Patient's Chart, lab work & pertinent test results  Airway Mallampati: II  TM Distance: >3 FB Neck ROM: Limited    Dental  (+) Teeth Intact   Pulmonary former smoker,    Pulmonary exam normal        Cardiovascular Exercise Tolerance: Good hypertension, Pt. on medications and Pt. on home beta blockers + CAD and + Cardiac Stents   Rhythm:Regular Rate:Normal  Stents in the 90s and  Has done well since.   Neuro/Psych    GI/Hepatic   Endo/Other    Renal/GU      Musculoskeletal   Abdominal (+) + obese,   Peds  Hematology   Anesthesia Other Findings   Reproductive/Obstetrics                             Anesthesia Physical Anesthesia Plan  ASA: III  Anesthesia Plan: General   Post-op Pain Management:    Induction: Intravenous  Airway Management Planned: LMA  Additional Equipment:   Intra-op Plan:   Post-operative Plan: Extubation in OR  Informed Consent: I have reviewed the patients History and Physical, chart, labs and discussed the procedure including the risks, benefits and alternatives for the proposed anesthesia with the patient or authorized representative who has indicated his/her understanding and acceptance.     Plan Discussed with: CRNA  Anesthesia Plan Comments:         Anesthesia Quick Evaluation

## 2015-10-31 NOTE — Anesthesia Procedure Notes (Signed)
Procedure Name: Intubation Date/Time: 10/31/2015 3:05 PM Performed by: Dionne Bucy Pre-anesthesia Checklist: Patient identified, Patient being monitored, Timeout performed, Emergency Drugs available and Suction available Patient Re-evaluated:Patient Re-evaluated prior to inductionOxygen Delivery Method: Circle system utilized Preoxygenation: Pre-oxygenation with 100% oxygen Intubation Type: IV induction Ventilation: Mask ventilation without difficulty Laryngoscope Size: Mac and 4 Grade View: Grade I Tube type: Oral Tube size: 7.0 mm Number of attempts: 1 Airway Equipment and Method: Stylet Placement Confirmation: ETT inserted through vocal cords under direct vision,  positive ETCO2 and breath sounds checked- equal and bilateral Secured at: 23 cm Tube secured with: Tape Dental Injury: Teeth and Oropharynx as per pre-operative assessment

## 2015-10-31 NOTE — Anesthesia Postprocedure Evaluation (Signed)
Anesthesia Post Note  Patient: Andrew Richardson  Procedure(s) Performed: Procedure(s) (LRB): HERNIA REPAIR INGUINAL ADULT (Left)  Patient location during evaluation: PACU Anesthesia Type: General Level of consciousness: awake and alert Pain management: pain level controlled Vital Signs Assessment: post-procedure vital signs reviewed and stable Respiratory status: spontaneous breathing and respiratory function stable Cardiovascular status: stable Anesthetic complications: no    Last Vitals:  Filed Vitals:   10/31/15 1241 10/31/15 1629  BP: 148/84 150/70  Pulse: 56   Temp: 36.4 C 36.7 C  Resp: 14     Last Pain:  Filed Vitals:   10/31/15 1630  PainSc: 1                  Kehinde Totzke K

## 2015-10-31 NOTE — OR Nursing (Signed)
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

## 2015-11-03 ENCOUNTER — Encounter: Payer: Self-pay | Admitting: Surgery

## 2016-11-18 ENCOUNTER — Encounter: Payer: Self-pay | Admitting: Surgery

## 2020-10-05 ENCOUNTER — Other Ambulatory Visit: Payer: Self-pay

## 2020-10-05 ENCOUNTER — Emergency Department: Payer: Medicare Other

## 2020-10-05 ENCOUNTER — Encounter: Payer: Self-pay | Admitting: Emergency Medicine

## 2020-10-05 ENCOUNTER — Inpatient Hospital Stay
Admission: EM | Admit: 2020-10-05 | Discharge: 2020-10-06 | DRG: 282 | Disposition: A | Discharge status: Other Acute Inpatient Hospital | Payer: Medicare Other | Attending: Internal Medicine | Admitting: Internal Medicine

## 2020-10-05 DIAGNOSIS — Z9861 Coronary angioplasty status: Secondary | ICD-10-CM | POA: Diagnosis not present

## 2020-10-05 DIAGNOSIS — Z8551 Personal history of malignant neoplasm of bladder: Secondary | ICD-10-CM | POA: Diagnosis not present

## 2020-10-05 DIAGNOSIS — I10 Essential (primary) hypertension: Secondary | ICD-10-CM | POA: Diagnosis present

## 2020-10-05 DIAGNOSIS — I214 Non-ST elevation (NSTEMI) myocardial infarction: Principal | ICD-10-CM | POA: Diagnosis present

## 2020-10-05 DIAGNOSIS — Z7982 Long term (current) use of aspirin: Secondary | ICD-10-CM

## 2020-10-05 DIAGNOSIS — E785 Hyperlipidemia, unspecified: Secondary | ICD-10-CM | POA: Diagnosis present

## 2020-10-05 DIAGNOSIS — Z87891 Personal history of nicotine dependence: Secondary | ICD-10-CM | POA: Diagnosis not present

## 2020-10-05 DIAGNOSIS — F32A Depression, unspecified: Secondary | ICD-10-CM | POA: Diagnosis present

## 2020-10-05 DIAGNOSIS — I2510 Atherosclerotic heart disease of native coronary artery without angina pectoris: Secondary | ICD-10-CM | POA: Diagnosis present

## 2020-10-05 DIAGNOSIS — Z20822 Contact with and (suspected) exposure to covid-19: Secondary | ICD-10-CM | POA: Diagnosis present

## 2020-10-05 DIAGNOSIS — Z79899 Other long term (current) drug therapy: Secondary | ICD-10-CM | POA: Diagnosis not present

## 2020-10-05 LAB — TROPONIN I (HIGH SENSITIVITY)
Troponin I (High Sensitivity): 32 ng/L — ABNORMAL HIGH (ref <18)
Troponin I (High Sensitivity): 499 ng/L (ref <18)
Troponin I (High Sensitivity): 1503 ng/L (ref <18)

## 2020-10-05 LAB — BASIC METABOLIC PANEL
Anion gap: 15 (ref 5–15)
BUN: 13 mg/dL (ref 8–23)
CO2: 26 mmol/L (ref 22–32)
Calcium: 9.6 mg/dL (ref 8.9–10.3)
Chloride: 95 mmol/L — ABNORMAL LOW (ref 98–111)
Creatinine, Ser: 0.96 mg/dL (ref 0.61–1.24)
GFR, Estimated: >60 mL/min (ref >60)
Glucose, Bld: 159 mg/dL — ABNORMAL HIGH (ref 70–99)
Potassium: 3.8 mmol/L (ref 3.5–5.1)
Sodium: 136 mmol/L (ref 135–145)

## 2020-10-05 LAB — CBC
HCT: 40.9 % (ref 39.0–52.0)
Hemoglobin: 14.1 g/dL (ref 13.0–17.0)
MCH: 34.2 pg — ABNORMAL HIGH (ref 26.0–34.0)
MCHC: 34.5 g/dL (ref 30.0–36.0)
MCV: 99.3 fL (ref 80.0–100.0)
Platelets: 177 10*3/uL (ref 150–400)
RBC: 4.12 MIL/uL — ABNORMAL LOW (ref 4.22–5.81)
RDW: 12.3 % (ref 11.5–15.5)
WBC: 5.8 10*3/uL (ref 4.0–10.5)
nRBC: 0 % (ref 0.0–0.2)

## 2020-10-05 LAB — PROTIME-INR
INR: 1 (ref 0.8–1.2)
Prothrombin Time: 12.5 seconds (ref 11.4–15.2)

## 2020-10-05 LAB — APTT: aPTT: 26 seconds (ref 24–36)

## 2020-10-05 MED ORDER — SODIUM CHLORIDE 0.9 % IV SOLN: INTRAVENOUS | Status: DC

## 2020-10-05 MED ORDER — HEPARIN BOLUS VIA INFUSION
4000.0000 [IU] | Freq: Once | INTRAVENOUS | Status: AC
  Administered 2020-10-05: 4000 [IU] via INTRAVENOUS
  Filled 2020-10-05: qty 4000

## 2020-10-05 MED ORDER — NITROGLYCERIN 0.4 MG SL SUBL: 0.4000 mg | SUBLINGUAL_TABLET | SUBLINGUAL | Status: DC | PRN

## 2020-10-05 MED ORDER — ASPIRIN EC 81 MG PO TBEC
81.0000 mg | DELAYED_RELEASE_TABLET | Freq: Every day | ORAL | Status: DC
  Administered 2020-10-06: 81 mg via ORAL

## 2020-10-05 MED ORDER — HEPARIN (PORCINE) 25000 UT/250ML-% IV SOLN
1150.0000 [IU]/h | INTRAVENOUS | Status: DC
  Administered 2020-10-05: 1150 [IU]/h via INTRAVENOUS
  Filled 2020-10-05: qty 250

## 2020-10-05 MED ORDER — HYDROCHLOROTHIAZIDE 25 MG PO TABS
25.0000 mg | ORAL_TABLET | Freq: Every day | ORAL | Status: DC
  Administered 2020-10-05: 25 mg via ORAL
  Filled 2020-10-05: qty 1

## 2020-10-05 MED ORDER — ONDANSETRON HCL 4 MG/2ML IJ SOLN: 4.0000 mg | Freq: Four times a day (QID) | INTRAMUSCULAR | Status: DC | PRN

## 2020-10-05 MED ORDER — CLONIDINE HCL 0.1 MG PO TABS
0.1000 mg | ORAL_TABLET | Freq: Once | ORAL | Status: AC
  Administered 2020-10-05: 0.1 mg via ORAL
  Filled 2020-10-05: qty 1

## 2020-10-05 MED ORDER — TAMSULOSIN HCL 0.4 MG PO CAPS
0.4000 mg | ORAL_CAPSULE | Freq: Every day | ORAL | Status: DC
  Administered 2020-10-05: 0.4 mg via ORAL
  Filled 2020-10-05: qty 1

## 2020-10-05 MED ORDER — HYDROCODONE-ACETAMINOPHEN 5-325 MG PO TABS: 1.0000 | ORAL_TABLET | ORAL | Status: DC | PRN

## 2020-10-05 MED ORDER — METOPROLOL TARTRATE 50 MG PO TABS: 50.0000 mg | ORAL_TABLET | Freq: Two times a day (BID) | ORAL | Status: DC

## 2020-10-05 MED ORDER — ASPIRIN EC 81 MG PO TBEC: 81.0000 mg | DELAYED_RELEASE_TABLET | ORAL | Status: DC | PRN

## 2020-10-05 MED ORDER — ASPIRIN 300 MG RE SUPP: 300.0000 mg | RECTAL | Status: DC

## 2020-10-05 MED ORDER — ACETAMINOPHEN 325 MG PO TABS: 650.0000 mg | ORAL_TABLET | ORAL | Status: DC | PRN

## 2020-10-05 MED ORDER — SIMVASTATIN 10 MG PO TABS
40.0000 mg | ORAL_TABLET | Freq: Every evening | ORAL | Status: DC
  Administered 2020-10-05: 40 mg via ORAL
  Filled 2020-10-05: qty 4

## 2020-10-05 MED ORDER — LOSARTAN POTASSIUM 50 MG PO TABS
100.0000 mg | ORAL_TABLET | Freq: Every day | ORAL | Status: DC
  Administered 2020-10-05: 100 mg via ORAL
  Filled 2020-10-05: qty 2

## 2020-10-05 MED ORDER — ASPIRIN 81 MG PO CHEW
324.0000 mg | CHEWABLE_TABLET | ORAL | Status: DC
  Filled 2020-10-05: qty 4

## 2020-10-05 NOTE — Consult Note (Signed)
ANTICOAGULATION CONSULT NOTE - Initial Consult  Pharmacy Consult for heparin infusion Indication: chest pain/ACS  Allergies  Allergen Reactions  . Tetracyclines & Related Diarrhea  . Nitrofurantoin Rash    Patient Measurements: Height: 5\' 8"  (172.7 cm) Weight: 90.7 kg (200 lb) IBW/kg (Calculated) : 68.4 Heparin Dosing Weight: 87 kg  Vital Signs: Temp: 97.7 F (36.5 C) (01/09 1211) Temp Source: Oral (01/09 1211) BP: 196/87 (01/09 1800) Pulse Rate: 72 (01/09 1800)  Labs: Recent Labs    10/05/20 1213 10/05/20 1731 10/05/20 1813  HGB 14.1  --   --   HCT 40.9  --   --   PLT 177  --   --   APTT  --   --  26  LABPROT  --   --  12.5  INR  --   --  1.0  CREATININE 0.96  --   --   TROPONINIHS 32* 499*  --     Estimated Creatinine Clearance: 66 mL/min (by C-G formula based on SCr of 0.96 mg/dL).   Medical History: Past Medical History:  Diagnosis Date  . Colon polyps   . Coronary artery disease   . Depression   . Headache   . Hyperlipemia   . Hypertension   . 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 Tubular adenoma of colon      Assessment: 80 y.o. male with medical history significant of CAD s/p PCI x 2, HLD, HTN, presented with chest pain. Pharmacy gas been consulted for heparin infusion for ACS/chest pain indication. Baseline labs ordered and reviewed.  Goal of Therapy:  Heparin level 0.3-0.7 units/ml Monitor platelets by anticoagulation protocol: Yes   Plan:  Give 4000 units bolus x 1 Start heparin infusion at 1150 units/hr Check anti-Xa level in 8 hours and daily while on heparin Continue to monitor H&H and platelets  Dorothe Pea, PharmD, BCPS Clinical Pharmacist  10/05/2020,6:55 PM

## 2020-10-05 NOTE — ED Notes (Signed)
Date and time results received: 10/05/20 1807 (use smartphrase ".now" to insert current time)  Test: troponin Critical Value: 499  Name of Provider Notified: Joni Fears, MD  Orders Received? Or Actions Taken?: Orders Received - See Orders for details

## 2020-10-05 NOTE — H&P (Signed)
History and Physical    Andrew Richardson ZOX:[id number] DOB: 13-Mar-1941 DOA: 10/05/2020  PCP: Derinda Late, MD   Patient coming from: home  I have personally briefly reviewed patient's old medical records in [place]  Chief Complaint: chest pain  HPI: Andrew Richardson is a 80 y.o. male with medical history significant of CAD s/p PCI x 2, HLD, HTN was awakened by substernal chest pain described as a pressure with radiation to the right arm. Denies diaphoresis, SOB. Pain lasted until he was brought by EMS to [facility]. EMT had placed NTG tape.  ED Course: T97.7  154/75   HR 62  RR 12  BMI 30. ED-PE unremarkable. Lab - Bmet with Glucose 159, CBC nl, Troponin # 1 32, #2 499. EEKG w/o acute changes. CXR NAD. In ED heparin bolus and infusion started. Dr. Saralyn Pilar consulted. TRH called to admit patient for mgt of NSTEMI.   Review of Systems: As per HPI otherwise 10 point review of systems negative.    Past Medical History:  Diagnosis Date  . Colon polyps   . Coronary artery disease   . Depression   . Headache   . Hyperlipemia   . Hypertension   . 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 Tubular adenoma of colon     Past Surgical History:  Procedure Laterality Date  . ANGIOPLASTY    . COLONOSCOPY    . COLONOSCOPY WITH PROPOFOL N/A 05/20/2015   Procedure: COLONOSCOPY WITH PROPOFOL;  Surgeon: Lollie Sails, MD;  Location[facility] END[facility]COPY;  Service: Endoscopy;  Laterality: N/A;  . INGUINAL HERNIA REPAIR Left 10/31/2015   Procedure: HERNIA REPAIR INGUINAL ADULT;  Surgeon: Leonie Green, MD;  Location: [facility] ORS;  Service: General;  Laterality: Left;    Soc Hx - married 54 years. No children. He is a retired Animal nutritionist. They live independently   reports that he quit smoking about 31 years ago. He has quit using smokeless tobacco. He reports current alcohol use of about 3.0 standard drinks of alcohol per week. He reports that he does not use drugs.  Allergies   Allergen Reactions  . Tetracyclines & Related Diarrhea  . Nitrofurantoin Rash    No family history on file.   Prior to Admission medications   Medication Sig Start Date End Date Taking? Authorizing Provider  aspirin (ASPIRIN EC) 81 MG EC tablet Take 81 mg by mouth as needed for pain. Swallow whole.    [provider]  hydrochlorothiazide (HYDRODIURIL) 25 MG tablet Take 25 mg by mouth daily.    [provider]  HYDROcodone-acetaminophen (NORCO) 5-325 MG tablet Take 1-2 tablets by mouth every 4 (four) hours as needed for moderate pain. 10/31/15   Leonie Green, MD  losartan (COZAAR) 100 MG tablet Take 100 mg by mouth daily.    [provider]  metoprolol (LOPRESSOR) 50 MG tablet Take 50 mg by mouth 2 (two) times daily.     [provider]  simvastatin (ZOCOR) 40 MG tablet Take 40 mg by mouth every evening.     [provider]  tamsulosin (FLOMAX) 0.4 MG CAPS capsule Take 0.4 mg by mouth daily.    [provider]    Physical Exam: Vitals:   10/05/20 1652 10/05/20 1744 10/05/20 1800 10/05/20 1900  BP: (!) 186/90 (!) 202/110 (!) 196/87 (!) 154/75  Pulse: 64 72 72 62  Resp: 20 (!) 22 15 12   Temp:      TempSrc:  SpO2: 98% 97% 99% 96%  Weight:      Height:         Vitals:   10/05/20 1652 10/05/20 1744 10/05/20 1800 10/05/20 1900  BP: (!) 186/90 (!) 202/110 (!) 196/87 (!) 154/75  Pulse: 64 72 72 62  Resp: 20 (!) 22 15 12   Temp:      TempSrc:      SpO2: 98% 97% 99% 96%  Weight:      Height:       General: WNWD man looking younger than his stated age. Eyes: PERRL, lids and conjunctivae normal ENMT: Mucous membranes are moist. Posterior pharynx clear of any exudate or lesions.Normal dentitio with a right lower bridge missing..  Neck: normal, supple, no masses, no thyromegaly Respiratory: clear to auscultation bilaterally, no wheezing, no crackles. Normal respiratory effort. No accessory muscle use.  Cardiovascular:  Regular rate and rhythm, no murmurs / rubs / gallops. No extremity edema. 2+ pedal pulses. No carotid bruits.  Abdomen: no tenderness, no masses palpated. No hepatosplenomegaly. Bowel sounds positive.  Musculoskeletal: no clubbing / cyanosis. No joint deformity upper and lower extremities. Good ROM, no contractures. Normal muscle tone.  Skin: no rashes, lesions, ulcers. No induration Neurologic: CN 2-12 grossly intact. Sensation intact, DTR normal. Strength 5/5 in all 4.  Psychiatric: Normal judgment and insight. Alert and oriented x 3. Normal mood.     Labs on Admission: I have personally reviewed following labs and imaging studies  CBC: Recent Labs  Lab 10/05/20 1213  WBC 5.8  HGB 14.1  HCT 40.9  MCV 99.3  PLT 443   Basic Metabolic Panel: Recent Labs  Lab 10/05/20 1213  NA 136  K 3.8  CL 95*  CO2 26  GLUCOSE 159*  BUN 13  CREATININE 0.96  CALCIUM 9.6   GFR: Estimated Creatinine Clearance: 66 mL/min (by C-G formula based on SCr of 0.96 mg/dL). Liver Function Tests: No results for input(s): AST, ALT, ALKPHOS, BILITOT, PROT, ALBUMIN in the last 168 hours. No results for input(s): LIPASE, AMYLASE in the last 168 hours. No results for input(s): AMMONIA in the last 168 hours. Coagulation Profile: Recent Labs  Lab 10/05/20 1813  INR 1.0   Cardiac Enzymes: No results for input(s): CKTOTAL, CKMB, CKMBINDEX, TROPONINI in the last 168 hours. BNP (last 3 results) No results for input(s): PROBNP in the last 8760 hours. HbA1C: No results for input(s): HGBA1C in the last 72 hours. CBG: No results for input(s): GLUCAP in the last 168 hours. Lipid Profile: No results for input(s): CHOL, HDL, LDLCALC, TRIG, CHOLHDL, LDLDIRECT in the last 72 hours. Thyroid Function Tests: No results for input(s): TSH, T4TOTAL, FREET4, T3FREE, THYROIDAB in the last 72 hours. Anemia Panel: No results for input(s): VITAMINB12, FOLATE, FERRITIN, TIBC, IRON, RETICCTPCT in the last 72  hours. Urine analysis: No results found for: COLORURINE, APPEARANCEUR, [place], [place], GLUCOSEU, HGBUR, BILIRUBINUR, KETONESUR, PROTEINUR, UROBILINOGEN, NITRITE, LEUKOCYTESUR  Radiological Exams on Admission: DG Chest Portable 1 View  Result Date: 10/05/2020 CLINICAL DATA:  Chest pain. EXAM: PORTABLE CHEST 1 VIEW COMPARISON:  None. FINDINGS: The heart size and mediastinal contours are within normal limits. Both lungs are clear. The visualized skeletal structures are unremarkable. IMPRESSION: No active disease. Electronically Signed   By: Dorise Bullion III M.D   On: 10/05/2020 18:15    EKG: Independently reviewed. Sinus Rhythm, 1st AVB, old lateral and inferior injury.  Assessment/Plan Active Problems:   NSTEMI (non-ST elevated myocardial infarction) ([place])   CAD S/P percutaneous coronary angioplasty  HTN (hypertension)   HLD (hyperlipidemia)     1. NSTEMI - patient with known CAD s/p PCI w/o stent x 2. Now with acute onset chest pain relieved by NTG. Troponins trending up: 32 to 499 Plan Cardiac progressive admit  Heparin infusion  Cardiology consult - may be a cath candidate.  2. HTN- BP stable  Continue home meds  3. HLD - has had trouble with leg weakness that improved off "statin" tx but he has retarted. Plan Can discuss alternatives to Statins with either PCP or cardiology.    DVT prophylaxis: full dose heparin  Code Status: full code  Family Communication: Spoke with Mrs. Farnan: gave details of Dx and Tx plan Disposition Plan: home when stable Consults called: Cardiology - Dr. Saralyn Pilar  Admission status: inpatient - tele    Adella Hare MD [facility] Pager [phone]  If 7PM-7AM, please contact night-coverage [email or web address] Password [email or web address]  10/05/2020, 7:30 PM

## 2020-10-05 NOTE — ED Triage Notes (Signed)
Pt to ED via [facility] from home for chest pain that started about 0700. Pt states that the pain is in the center of his chest and radiates into his right arm. Pt states that he is also having some weakness. Pt took 2 adult Aspirin piror to EMS arrival, EMS applied 1 inch of nitro paste. Pt is currently in NAD. Pt states that the pain is a constant pain but has eased up some.

## 2020-10-05 NOTE — ED Provider Notes (Signed)
[facility] Emergency Department Provider Note  ____________________________________________  Time seen: Approximately 5:42 PM  I have reviewed the triage vital signs and the nursing notes.   HISTORY  Chief Complaint Chest Pain    HPI Andrew Richardson is a 80 y.o. male with a history of CAD hypertension hyperlipidemia who comes the ED complaining of chest pain that started at 7:00 AM.  Was in the center of the chest felt like a "temperature change,", 4/10 at its worst, radiating to right shoulder.  No shortness of breath diaphoresis or vomiting.  Was constant without aggravating or alleviating factors until about 1:00 PM when it resolved.  He took 162 mg of aspirin earlier today.  Denies any exertional symptoms.  Not pleuritic.  Now feels back to baseline.  He has been compliant with his usual medication regimen.  Sees PCP Dr. Baldemar Lenis.      Past Medical History:  Diagnosis Date  . Colon polyps   . Coronary artery disease   . Depression   . Headache   . Hyperlipemia   . Hypertension   . 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 Tubular adenoma of colon      Patient Active Problem List   Diagnosis Date Noted  . NSTEMI (non-ST elevated myocardial infarction) ([place]) 10/05/2020  . CAD S/P percutaneous coronary angioplasty 10/05/2020  . HTN (hypertension) 10/05/2020  . HLD (hyperlipidemia) 10/05/2020     Past Surgical History:  Procedure Laterality Date  . ANGIOPLASTY    . COLONOSCOPY    . COLONOSCOPY WITH PROPOFOL N/A 05/20/2015   Procedure: COLONOSCOPY WITH PROPOFOL;  Surgeon: Lollie Sails, MD;  Location: [facility] ENDOSCOPY;  Service: Endoscopy;  Laterality: N/A;  . INGUINAL HERNIA REPAIR Left 10/31/2015   Procedure: HERNIA REPAIR INGUINAL ADULT;  Surgeon: Leonie Green, MD;  Location: [facility] ORS;  Service: General;  Laterality: Left;     Prior to Admission medications   Medication Sig Start Date End Date Taking? Authorizing Provider   aspirin (ASPIRIN EC) 81 MG EC tablet Take 81 mg by mouth as needed for pain. Swallow whole.    [provider]  hydrochlorothiazide (HYDRODIURIL) 25 MG tablet Take 25 mg by mouth daily.    [provider]  HYDROcodone-acetaminophen (NORCO) 5-325 MG tablet Take 1-2 tablets by mouth every 4 (four) hours as needed for moderate pain. 10/31/15   Leonie Green, MD  losartan (COZAAR) 100 MG tablet Take 100 mg by mouth daily.    [provider]  metoprolol (LOPRESSOR) 50 MG tablet Take 50 mg by mouth 2 (two) times daily.     [provider]  simvastatin (ZOCOR) 40 MG tablet Take 40 mg by mouth every evening.     [provider]  tamsulosin (FLOMAX) 0.4 MG CAPS capsule Take 0.4 mg by mouth daily.    [provider]     Allergies Tetracyclines & related and Nitrofurantoin   No family history on file.  Social History Social History   Tobacco Use  . Smoking status: Former Smoker    Quit date: 05/19/1989    Years since quitting: 31.4  . Smokeless tobacco: Former Network engineer Use Topics  . Alcohol use: Yes    Alcohol/week: 3.0 standard drinks    Types: 3 Shots of liquor per week  . Drug use: No    Review of Systems  Constitutional:   No fever or chills.  ENT:   No sore throat. No rhinorrhea. Cardiovascular:  Positive chest pain as above without syncope. Respiratory:   No dyspnea or cough. Gastrointestinal:   Negative for abdominal pain, vomiting and diarrhea.  Musculoskeletal:   Negative for focal pain or swelling All other systems reviewed and are negative except as documented above in ROS and HPI.  ____________________________________________   PHYSICAL EXAM:  VITAL SIGNS: ED Triage Vitals  Enc Vitals Group     BP 10/05/20 1211 (!) 160/89     Pulse Rate 10/05/20 1211 90     Resp 10/05/20 1211 16     Temp 10/05/20 1211 97.7 F (36.5 C)     Temp Source 10/05/20 1211 Oral     SpO2 10/05/20 1211 99 %     Weight  10/05/20 1204 200 lb (90.7 kg)     Height 10/05/20 1204 5\' 8"  (1.727 m)     Head Circumference --      Peak Flow --      Pain Score 10/05/20 1204 3     Pain Loc --      Pain Edu? --      Excl. in [place]? --     Vital signs reviewed, nursing assessments reviewed.   Constitutional:   Alert and oriented. Non-toxic appearance. Eyes:   Conjunctivae are normal. EOMI. PERRL. ENT      Head:   Normocephalic and atraumatic.      Nose:   Wearing a mask.      Mouth/Throat:   Wearing a mask.      Neck:   No meningismus. Full ROM. Hematological/Lymphatic/Immunilogical:   No cervical lymphadenopathy. Cardiovascular:   RRR. Symmetric bilateral radial and DP pulses.  No murmurs. Cap refill less than 2 seconds. Respiratory:   Normal respiratory effort without tachypnea/retractions. Breath sounds are clear and equal bilaterally. No wheezes/rales/rhonchi. Gastrointestinal:   Soft and nontender. Non distended. There is no CVA tenderness.  No rebound, rigidity, or guarding.  Musculoskeletal:   Normal range of motion in all extremities. No joint effusions.  No lower extremity tenderness.  No edema. Neurologic:   Normal speech and language.  Motor grossly intact. No acute focal neurologic deficits are appreciated.  Skin:    Skin is warm, dry and intact. No rash noted.  No petechiae, purpura, or bullae.  ____________________________________________    LABS (pertinent positives/negatives) (all labs ordered are listed, but only abnormal results are displayed) Labs Reviewed  BASIC METABOLIC PANEL - Abnormal; Notable for the following components:      Result Value   Chloride 95 (*)    Glucose, Bld 159 (*)    All other components within normal limits  CBC - Abnormal; Notable for the following components:   RBC 4.12 (*)    MCH 34.2 (*)    All other components within normal limits  TROPONIN I (HIGH SENSITIVITY) - Abnormal; Notable for the following components:   Troponin I (High Sensitivity) 32 (*)     All other components within normal limits  TROPONIN I (HIGH SENSITIVITY) - Abnormal; Notable for the following components:   Troponin I (High Sensitivity) 499 (*)    All other components within normal limits  SARS CORONAVIRUS 2 (TAT 6-24 HRS)  PROTIME-INR  APTT  HEPARIN LEVEL (UNFRACTIONATED)  CBC  BASIC METABOLIC PANEL  LIPID PANEL  TROPONIN I (HIGH SENSITIVITY)   ____________________________________________   EKG  Interpreted by me Sinus rhythm rate of 93, normal axis.  First-degree AV block.  Normal QRS and ST segments.  Isolated T wave inversion in lead III which  is nonspecific.  No acute ischemic changes.  ____________________________________________    [id number]  DG Chest Portable 1 View  Result Date: 10/05/2020 CLINICAL DATA:  Chest pain. EXAM: PORTABLE CHEST 1 VIEW COMPARISON:  None. FINDINGS: The heart size and mediastinal contours are within normal limits. Both lungs are clear. The visualized skeletal structures are unremarkable. IMPRESSION: No active disease. Electronically Signed   By: Dorise Bullion III M.D   On: 10/05/2020 18:15    ____________________________________________   PROCEDURES .Critical Care Performed by: Carrie Mew, MD Authorized by: Carrie Mew, MD   Critical care provider statement:    Critical care time (minutes):  33   Critical care time was exclusive of:  Separately billable procedures and treating other patients   Critical care was necessary to treat or prevent imminent or life-threatening deterioration of the following conditions:  Cardiac failure   Critical care was time spent personally by me on the following activities:  Development of treatment plan with patient or surrogate, discussions with consultants, evaluation of patient's response to treatment, examination of patient, obtaining history from patient or surrogate, ordering and performing treatments and interventions, ordering and review of laboratory studies, ordering  and review of radiographic studies, pulse oximetry, re-evaluation of patient's condition and review of old charts    ____________________________________________  DIFFERENTIAL DIAGNOSIS   GERD, angina, non-STEMI, pneumothorax  CLINICAL IMPRESSION / ASSESSMENT AND PLAN / ED COURSE  Medications ordered in the ED: Medications  heparin ADULT infusion 100 units/mL (25000 units/230mL) (1,150 Units/hr Intravenous New Bag/Given 10/05/20 1854)  aspirin EC tablet 81 mg (has no administration in time range)  HYDROcodone-acetaminophen (NORCO/VICODIN) 5-325 MG per tablet 1-2 tablet (has no administration in time range)  hydrochlorothiazide (HYDRODIURIL) tablet 25 mg (has no administration in time range)  losartan (COZAAR) tablet 100 mg (has no administration in time range)  metoprolol tartrate (LOPRESSOR) tablet 50 mg (has no administration in time range)  simvastatin (ZOCOR) tablet 40 mg (has no administration in time range)  tamsulosin (FLOMAX) capsule 0.4 mg (has no administration in time range)  aspirin chewable tablet 324 mg (has no administration in time range)    Or  aspirin suppository 300 mg (has no administration in time range)  aspirin EC tablet 81 mg (has no administration in time range)  nitroGLYCERIN (NITROSTAT) SL tablet 0.4 mg (has no administration in time range)  acetaminophen (TYLENOL) tablet 650 mg (has no administration in time range)  ondansetron (ZOFRAN) injection 4 mg (has no administration in time range)  0.9 %  sodium chloride infusion (has no administration in time range)  cloNIDine (CATAPRES) tablet 0.1 mg (0.1 mg Oral Given 10/05/20 1744)  heparin bolus via infusion 4,000 Units (4,000 Units Intravenous Bolus from Bag 10/05/20 1854)    Pertinent labs & imaging results that were available during my care of the patient were reviewed by me and considered in my medical decision making (see chart for details).  Andrew Richardson was evaluated in Emergency Department on  10/05/2020 for the symptoms described in the history of present illness. He was evaluated in the context of the global COVID-19 pandemic, which necessitated consideration that the patient might be at risk for infection with the SARS-CoV-2 virus that causes COVID-19. Institutional protocols and algorithms that pertain to the evaluation of patients at risk for COVID-19 are in a state of rapid change based on information released by regulatory bodies including the CDC and federal and state organizations. These policies and algorithms were followed during the patient's care in the  ED.   Patient presents with nonspecific chest discomfort that resolved at 1:00 PM.  EKG nonischemic.  Initial troponin 32, no priors to compare.  He had a nuclear medicine stress and TTE about 6 months ago, both reassuring.  With resolution of pain, will obtain a serial troponin if not rising I think he stable for outpatient follow-up with his cardiologist Dr. Saralyn Pilar.  Clinical Course as of 10/05/20 1934  Nancy Fetter Oct 05, 2020  1811 Troponin I (High Sensitivity)(!!) Second troponin has increased to 500, concerning for non-STEMI.  We will start heparin and plan to admit. [PS]    Clinical Course User Index [PS] Carrie Mew, MD     ----------------------------------------- 7:34 PM on 10/05/2020 -----------------------------------------  Case discussed with cardiology and hospitalist.  ____________________________________________   FINAL CLINICAL IMPRESSION(S) / ED DIAGNOSES    Final diagnoses:  NSTEMI (non-ST elevated myocardial infarction) [facility])     ED Discharge Orders    None      Portions of this note were generated with dragon dictation software. Dictation errors may occur despite best attempts at proofreading.   Carrie Mew, MD 10/05/20 [id number]

## 2020-10-05 NOTE — ED Triage Notes (Signed)
Pt in via EMS from home with c/o cp that started at 0700 and woke him up and has been getting worse. Pt with cardiac hx. Pt took 2 adult aspirin and EMS applied 1 in of paste  180/82, FSBS 164, #20 to right [facility]

## 2020-10-05 NOTE — ED Notes (Signed)
Joni Fears, MD aware of blood pressure.

## 2020-10-06 ENCOUNTER — Encounter: Payer: Self-pay | Admitting: Internal Medicine

## 2020-10-06 ENCOUNTER — Telehealth: Payer: Self-pay

## 2020-10-06 ENCOUNTER — Encounter
Admission: EM | Disposition: A | Discharge status: Home / Self Care | Payer: Self-pay | Source: Home / Self Care | Attending: Internal Medicine

## 2020-10-06 ENCOUNTER — Inpatient Hospital Stay: Admit: 2020-10-06 | Payer: Medicare Other

## 2020-10-06 ENCOUNTER — Other Ambulatory Visit: Payer: Self-pay

## 2020-10-06 ENCOUNTER — Inpatient Hospital Stay (HOSPITAL_COMMUNITY)
Admission: AD | Admit: 2020-10-06 | Discharge: 2020-10-15 | DRG: 236 | Disposition: A | Discharge status: Home Health | Payer: Medicare Other | Source: Other Acute Inpatient Hospital | Attending: Thoracic Surgery (Cardiothoracic Vascular Surgery) | Admitting: Thoracic Surgery (Cardiothoracic Vascular Surgery)

## 2020-10-06 ENCOUNTER — Encounter (HOSPITAL_COMMUNITY): Payer: Self-pay | Admitting: Cardiovascular Disease

## 2020-10-06 DIAGNOSIS — Z79899 Other long term (current) drug therapy: Secondary | ICD-10-CM | POA: Diagnosis not present

## 2020-10-06 DIAGNOSIS — Z951 Presence of aortocoronary bypass graft: Secondary | ICD-10-CM

## 2020-10-06 DIAGNOSIS — E785 Hyperlipidemia, unspecified: Secondary | ICD-10-CM | POA: Diagnosis present

## 2020-10-06 DIAGNOSIS — E039 Hypothyroidism, unspecified: Secondary | ICD-10-CM | POA: Diagnosis present

## 2020-10-06 DIAGNOSIS — Z8551 Personal history of malignant neoplasm of bladder: Secondary | ICD-10-CM | POA: Diagnosis not present

## 2020-10-06 DIAGNOSIS — J9811 Atelectasis: Secondary | ICD-10-CM | POA: Diagnosis not present

## 2020-10-06 DIAGNOSIS — E8770 Fluid overload, unspecified: Secondary | ICD-10-CM | POA: Diagnosis not present

## 2020-10-06 DIAGNOSIS — I25110 Atherosclerotic heart disease of native coronary artery with unstable angina pectoris: Secondary | ICD-10-CM | POA: Diagnosis present

## 2020-10-06 DIAGNOSIS — D62 Acute posthemorrhagic anemia: Secondary | ICD-10-CM | POA: Diagnosis not present

## 2020-10-06 DIAGNOSIS — F411 Generalized anxiety disorder: Secondary | ICD-10-CM | POA: Diagnosis not present

## 2020-10-06 DIAGNOSIS — I10 Essential (primary) hypertension: Secondary | ICD-10-CM | POA: Diagnosis present

## 2020-10-06 DIAGNOSIS — I214 Non-ST elevation (NSTEMI) myocardial infarction: Principal | ICD-10-CM

## 2020-10-06 DIAGNOSIS — R7303 Prediabetes: Secondary | ICD-10-CM | POA: Diagnosis present

## 2020-10-06 DIAGNOSIS — I2510 Atherosclerotic heart disease of native coronary artery without angina pectoris: Secondary | ICD-10-CM | POA: Diagnosis not present

## 2020-10-06 DIAGNOSIS — Z87891 Personal history of nicotine dependence: Secondary | ICD-10-CM

## 2020-10-06 DIAGNOSIS — Z881 Allergy status to other antibiotic agents status: Secondary | ICD-10-CM | POA: Diagnosis not present

## 2020-10-06 DIAGNOSIS — I4891 Unspecified atrial fibrillation: Secondary | ICD-10-CM | POA: Diagnosis not present

## 2020-10-06 DIAGNOSIS — Z01810 Encounter for preprocedural cardiovascular examination: Secondary | ICD-10-CM | POA: Diagnosis not present

## 2020-10-06 DIAGNOSIS — Z888 Allergy status to other drugs, medicaments and biological substances status: Secondary | ICD-10-CM

## 2020-10-06 DIAGNOSIS — Z79890 Hormone replacement therapy (postmenopausal): Secondary | ICD-10-CM

## 2020-10-06 DIAGNOSIS — J90 Pleural effusion, not elsewhere classified: Secondary | ICD-10-CM

## 2020-10-06 HISTORY — DX: Hypothyroidism, unspecified: E03.9

## 2020-10-06 HISTORY — DX: Prediabetes: R73.03

## 2020-10-06 HISTORY — DX: Non-ST elevation (NSTEMI) myocardial infarction: I21.4

## 2020-10-06 HISTORY — PX: LEFT HEART CATH: CATH118248

## 2020-10-06 LAB — CBC
HCT: 36.4 % — ABNORMAL LOW (ref 39.0–52.0)
Hemoglobin: 12.7 g/dL — ABNORMAL LOW (ref 13.0–17.0)
MCH: 34.5 pg — ABNORMAL HIGH (ref 26.0–34.0)
MCHC: 34.9 g/dL (ref 30.0–36.0)
MCV: 98.9 fL (ref 80.0–100.0)
Platelets: 178 10*3/uL (ref 150–400)
RBC: 3.68 MIL/uL — ABNORMAL LOW (ref 4.22–5.81)
RDW: 12.4 % (ref 11.5–15.5)
WBC: 7.9 10*3/uL (ref 4.0–10.5)
nRBC: 0 % (ref 0.0–0.2)

## 2020-10-06 LAB — LIPID PANEL
Cholesterol: 175 mg/dL (ref 0–200)
HDL: 67 mg/dL (ref >40)
LDL Cholesterol: 93 mg/dL (ref 0–99)
Total CHOL/HDL Ratio: 2.6 RATIO
Triglycerides: 73 mg/dL (ref <150)
VLDL: 15 mg/dL (ref 0–40)

## 2020-10-06 LAB — BASIC METABOLIC PANEL
Anion gap: 12 (ref 5–15)
BUN: 12 mg/dL (ref 8–23)
CO2: 26 mmol/L (ref 22–32)
Calcium: 9.1 mg/dL (ref 8.9–10.3)
Chloride: 98 mmol/L (ref 98–111)
Creatinine, Ser: 0.76 mg/dL (ref 0.61–1.24)
GFR, Estimated: >60 mL/min (ref >60)
Glucose, Bld: 110 mg/dL — ABNORMAL HIGH (ref 70–99)
Potassium: 3.6 mmol/L (ref 3.5–5.1)
Sodium: 136 mmol/L (ref 135–145)

## 2020-10-06 LAB — SARS CORONAVIRUS 2 (TAT 6-24 HRS): SARS Coronavirus 2: NEGATIVE

## 2020-10-06 LAB — HEPARIN LEVEL (UNFRACTIONATED): Heparin Unfractionated: 0.63 IU/mL (ref 0.30–0.70)

## 2020-10-06 LAB — TROPONIN I (HIGH SENSITIVITY): Troponin I (High Sensitivity): 2030 ng/L (ref <18)

## 2020-10-06 SURGERY — LEFT HEART CATH
Anesthesia: Moderate Sedation

## 2020-10-06 MED ORDER — LOSARTAN POTASSIUM 50 MG PO TABS
100.0000 mg | ORAL_TABLET | Freq: Every day | ORAL | Status: DC
  Administered 2020-10-07: 100 mg via ORAL
  Filled 2020-10-06: qty 2

## 2020-10-06 MED ORDER — SODIUM CHLORIDE 0.9% FLUSH: 3.0000 mL | INTRAVENOUS | Status: DC | PRN

## 2020-10-06 MED ORDER — IOHEXOL 300 MG/ML  SOLN
INTRAMUSCULAR | Status: DC | PRN
  Administered 2020-10-06: 105 mL

## 2020-10-06 MED ORDER — HEPARIN (PORCINE) IN NACL 1000-0.9 UT/500ML-% IV SOLN: INTRAVENOUS | Status: DC | PRN

## 2020-10-06 MED ORDER — HYDRALAZINE HCL 20 MG/ML IJ SOLN
10.0000 mg | INTRAMUSCULAR | Status: DC | PRN
Start: 2020-10-06 — End: 2020-10-06

## 2020-10-06 MED ORDER — ASPIRIN EC 81 MG PO TBEC
DELAYED_RELEASE_TABLET | ORAL | Status: AC
  Filled 2020-10-06: qty 1

## 2020-10-06 MED ORDER — ATORVASTATIN CALCIUM 80 MG PO TABS
80.0000 mg | ORAL_TABLET | Freq: Every day | ORAL | Status: DC
  Administered 2020-10-06: 80 mg via ORAL
  Filled 2020-10-06 (×2): qty 1

## 2020-10-06 MED ORDER — NITROGLYCERIN 2 % TD OINT
1.0000 [in_us] | TOPICAL_OINTMENT | Freq: Four times a day (QID) | TRANSDERMAL | Status: DC
  Administered 2020-10-06 – 2020-10-08 (×7): 1 [in_us] via TOPICAL
  Filled 2020-10-06: qty 30

## 2020-10-06 MED ORDER — LEVOTHYROXINE SODIUM 50 MCG PO TABS
50.0000 ug | ORAL_TABLET | Freq: Every day | ORAL | Status: DC
  Administered 2020-10-07 – 2020-10-08 (×2): 50 ug via ORAL
  Filled 2020-10-06 (×2): qty 1

## 2020-10-06 MED ORDER — MIDAZOLAM HCL 2 MG/2ML IJ SOLN
INTRAMUSCULAR | Status: DC | PRN
  Administered 2020-10-06: 1 mg via INTRAVENOUS

## 2020-10-06 MED ORDER — SODIUM CHLORIDE 0.9% FLUSH: 3.0000 mL | Freq: Two times a day (BID) | INTRAVENOUS | Status: DC

## 2020-10-06 MED ORDER — FENTANYL CITRATE (PF) 100 MCG/2ML IJ SOLN
INTRAMUSCULAR | Status: AC
  Filled 2020-10-06: qty 2

## 2020-10-06 MED ORDER — HEPARIN (PORCINE) 25000 UT/250ML-% IV SOLN
1200.0000 [IU]/h | INTRAVENOUS | Status: DC
  Administered 2020-10-06: 21:00:00 1100 [IU]/h via INTRAVENOUS
  Administered 2020-10-07: 1200 [IU]/h via INTRAVENOUS
  Filled 2020-10-06 (×3): qty 250

## 2020-10-06 MED ORDER — LABETALOL HCL 5 MG/ML IV SOLN
10.0000 mg | INTRAVENOUS | Status: DC | PRN
Start: 2020-10-06 — End: 2020-10-06

## 2020-10-06 MED ORDER — HEPARIN (PORCINE) IN NACL 1000-0.9 UT/500ML-% IV SOLN
INTRAVENOUS | Status: AC
  Filled 2020-10-06: qty 1000

## 2020-10-06 MED ORDER — SODIUM CHLORIDE 0.9 % WEIGHT BASED INFUSION
1.0000 mL/kg/h | INTRAVENOUS | Status: DC
  Administered 2020-10-06: 1 mL/kg/h via INTRAVENOUS

## 2020-10-06 MED ORDER — LIDOCAINE HCL (PF) 1 % IJ SOLN
INTRAMUSCULAR | Status: AC
  Filled 2020-10-06: qty 30

## 2020-10-06 MED ORDER — ASPIRIN EC 81 MG PO TBEC: 81.0000 mg | DELAYED_RELEASE_TABLET | Freq: Every day | ORAL | Status: DC

## 2020-10-06 MED ORDER — HEPARIN (PORCINE) IN NACL 1000-0.9 UT/500ML-% IV SOLN
INTRAVENOUS | Status: DC | PRN
  Administered 2020-10-06: 500 mL

## 2020-10-06 MED ORDER — ACETAMINOPHEN 325 MG PO TABS: 650.0000 mg | ORAL_TABLET | ORAL | Status: DC | PRN

## 2020-10-06 MED ORDER — MIDAZOLAM HCL 2 MG/2ML IJ SOLN
INTRAMUSCULAR | Status: AC
  Filled 2020-10-06: qty 2

## 2020-10-06 MED ORDER — SODIUM CHLORIDE 0.9 % IV SOLN: 250.0000 mL | INTRAVENOUS | Status: DC | PRN

## 2020-10-06 MED ORDER — ASPIRIN 81 MG PO CHEW: 81.0000 mg | CHEWABLE_TABLET | ORAL | Status: DC

## 2020-10-06 MED ORDER — AMLODIPINE BESYLATE 5 MG PO TABS
5.0000 mg | ORAL_TABLET | Freq: Every day | ORAL | Status: DC
  Administered 2020-10-06 – 2020-10-07 (×2): 5 mg via ORAL
  Filled 2020-10-06 (×2): qty 1

## 2020-10-06 MED ORDER — METOPROLOL TARTRATE 25 MG PO TABS
25.0000 mg | ORAL_TABLET | Freq: Two times a day (BID) | ORAL | Status: DC
  Administered 2020-10-06 – 2020-10-07 (×3): 25 mg via ORAL
  Filled 2020-10-06 (×3): qty 1

## 2020-10-06 MED ORDER — SODIUM CHLORIDE 0.9 % WEIGHT BASED INFUSION: 1.0000 mL/kg/h | INTRAVENOUS | Status: DC

## 2020-10-06 MED ORDER — ASPIRIN EC 81 MG PO TBEC
81.0000 mg | DELAYED_RELEASE_TABLET | Freq: Every day | ORAL | Status: DC
Start: 2020-10-07 — End: 2020-10-08
  Administered 2020-10-07: 81 mg via ORAL
  Filled 2020-10-06: qty 1

## 2020-10-06 MED ORDER — SODIUM CHLORIDE 0.9 % WEIGHT BASED INFUSION: 3.0000 mL/kg/h | INTRAVENOUS | Status: AC

## 2020-10-06 MED ORDER — FENTANYL CITRATE (PF) 100 MCG/2ML IJ SOLN
INTRAMUSCULAR | Status: DC | PRN
  Administered 2020-10-06: 25 ug via INTRAVENOUS

## 2020-10-06 MED ORDER — NITROGLYCERIN 0.4 MG SL SUBL: 0.4000 mg | SUBLINGUAL_TABLET | SUBLINGUAL | Status: DC | PRN

## 2020-10-06 MED ORDER — TAMSULOSIN HCL 0.4 MG PO CAPS
0.4000 mg | ORAL_CAPSULE | Freq: Every day | ORAL | Status: DC
  Filled 2020-10-06: qty 1

## 2020-10-06 MED ORDER — METOPROLOL TARTRATE 50 MG PO TABS: 50.0000 mg | ORAL_TABLET | Freq: Two times a day (BID) | ORAL | Status: DC

## 2020-10-06 MED ORDER — LIDOCAINE HCL (PF) 1 % IJ SOLN
INTRAMUSCULAR | Status: DC | PRN
  Administered 2020-10-06: 15 mL

## 2020-10-06 MED ORDER — ONDANSETRON HCL 4 MG/2ML IJ SOLN
4.0000 mg | Freq: Four times a day (QID) | INTRAMUSCULAR | Status: DC | PRN
  Administered 2020-10-08: 4 mg via INTRAVENOUS

## 2020-10-06 MED ORDER — ONDANSETRON HCL 4 MG/2ML IJ SOLN: 4.0000 mg | Freq: Four times a day (QID) | INTRAMUSCULAR | Status: DC | PRN

## 2020-10-06 MED ORDER — ASPIRIN 81 MG PO CHEW: 81.0000 mg | CHEWABLE_TABLET | Freq: Every day | ORAL | Status: DC

## 2020-10-06 SURGICAL SUPPLY — 9 items
CATH INFINITI 5FR ANG PIGTAIL (CATHETERS) ×2 IMPLANT
CATH INFINITI 5FR JL4 (CATHETERS) ×2 IMPLANT
CATH INFINITI JR4 5F (CATHETERS) ×2 IMPLANT
DEVICE CLOSURE MYNXGRIP 5F (Vascular Products) ×2 IMPLANT
KIT MANI 3VAL PERCEP (MISCELLANEOUS) ×2 IMPLANT
NEEDLE PERC 18GX7CM (NEEDLE) ×2 IMPLANT
PACK CARDIAC CATH (CUSTOM PROCEDURE TRAY) ×2 IMPLANT
SHEATH AVANTI 5FR X 11CM (SHEATH) ×2 IMPLANT
WIRE GUIDERIGHT .035X150 (WIRE) ×2 IMPLANT

## 2020-10-06 NOTE — H&P (Signed)
Cardiology Admission History and Physical:   Patient ID: Andrew Richardson MRN: [id number]; DOB: 1941-09-05   Admission date: 10/06/2020  Primary Care Provider: Derinda Late, MD [facility] Cardiologist: Dr Saralyn Pilar  Chief Complaint:  NSTEMI  Patient Profile:   80 year old male with past medical history of coronary artery disease, prediabetic, hypertension, hyperlipidemia, hypothyroid admitted to [facility] regional with non-ST elevation myocardial infarction transferred for coronary artery bypass and graft.  History of Present Illness:   Patient admitted to [place] regional on January 9 evening with chest pain.  He described the pain as "uncomfortable" radiating to his right upper extremity with no associated symptoms.  Similar to previous infarct pain.  Resolved with nitroglycerin.  He otherwise denies dyspnea, palpitations or syncope.  Cardiac catheterization performed today showed an 80% OM 2, 50% OM1, 80% mid LAD, 70% proximal LAD, 80% D1, 75% mid RCA, 50% distal RCA.  Mild RV dysfunction.  Patient transferred to [facility] for coronary artery bypass graft.   Past Medical History:  Diagnosis Date  . Colon polyps   . Coronary artery disease   . Depression   . Headache   . Hyperlipemia   . Hypertension   . Hypothyroid   . Prediabetes   . 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 Tubular adenoma of colon     Past Surgical History:  Procedure Laterality Date  . ANGIOPLASTY    . COLONOSCOPY    . COLONOSCOPY WITH PROPOFOL N/A 05/20/2015   Procedure: COLONOSCOPY WITH PROPOFOL;  Surgeon: Lollie Sails, MD;  Location: [facility] ENDOSCOPY;  Service: Endoscopy;  Laterality: N/A;  . INGUINAL HERNIA REPAIR Left 10/31/2015   Procedure: HERNIA REPAIR INGUINAL ADULT;  Surgeon: Leonie Green, MD;  Location: [facility] ORS;  Service: General;  Laterality: Left;     Medications Prior to Admission: Prior to Admission medications   Medication Sig Start Date End Date  Taking? Authorizing Provider  aspirin (ASPIRIN EC) 81 MG EC tablet Take 81 mg by mouth as needed for pain. Swallow whole.    [provider]  hydrochlorothiazide (HYDRODIURIL) 25 MG tablet Take 25 mg by mouth daily.    [provider]  HYDROcodone-acetaminophen (NORCO) 5-325 MG tablet Take 1-2 tablets by mouth every 4 (four) hours as needed for moderate pain. 10/31/15   Leonie Green, MD  losartan (COZAAR) 100 MG tablet Take 100 mg by mouth daily.    [provider]  metoprolol (LOPRESSOR) 50 MG tablet Take 50 mg by mouth 2 (two) times daily.     [provider]  simvastatin (ZOCOR) 40 MG tablet Take 40 mg by mouth every evening.     [provider]  tamsulosin (FLOMAX) 0.4 MG CAPS capsule Take 0.4 mg by mouth daily.    [provider]     Allergies:    Allergies  Allergen Reactions  . Tetracyclines & Related Diarrhea  . Nitrofurantoin Rash    Social History:   Social History   Socioeconomic History  . Marital status: Married    Spouse name: Hinton Dyer   . Number of children: 0  . Years of education: Not on file  . Highest education level: Not on file  Occupational History  . Not on file  Tobacco Use  . Smoking status: Former Smoker    Quit date: 05/19/1989    Years since quitting: 31.4  . Smokeless tobacco: Former Network engineer and Sexual Activity  . Alcohol use: Yes    Alcohol/week: 3.0  standard drinks    Types: 3 Shots of liquor per week  . Drug use: No  . Sexual activity: Not on file  Other Topics Concern  . Not on file  Social History Narrative   Lives with wife at home    Social Determinants of Health   Financial Resource Strain: Not on file  Food Insecurity: Not on file  Transportation Needs: Not on file  Physical Activity: Not on file  Stress: Not on file  Social Connections: Not on file  Intimate Partner Violence: Not on file    Family History:   The patient's family history includes Rheumatic fever  in his mother.    ROS:  Please see the history of present illness.  No fevers, chills, productive cough, hemoptysis.  All other ROS reviewed and negative.     Physical Exam/Data:   Vitals:   10/06/20 1653  BP: (!) 153/74  Pulse: 63  Resp: 15  Temp: 98 F (36.7 C)  TempSrc: Oral  SpO2: 99%   No intake or output data in the 24 hours ending 10/06/20 1728 Last 3 Weights 10/06/2020 10/05/2020 10/31/2015  Weight (lbs) 200 lb 200 lb 200 lb  Weight (kg) 90.719 kg 90.719 kg 90.719 kg     There is no height or weight on file to calculate BMI.  General:  Well nourished, well developed, in no acute distress HEENT: normal Lymph: no adenopathy Neck: no JVD Endocrine:  No thryomegaly Vascular: No carotid bruits; FA pulses 2+ bilaterally without bruits  Cardiac:  normal S1, S2; RRR; no murmur  Lungs:  clear to auscultation bilaterally, no wheezing, rhonchi or rales  Abd: soft, nontender, no hepatomegaly; right groin with no hematoma and no bruit Ext: no edema Musculoskeletal:  No deformities, BUE and BLE strength normal and equal Skin: warm and dry  Neuro:  CNs 2-12 intact, no focal abnormalities noted Psych:  Normal affect    EKG:  The ECG that was done normal sinus rhythm with anterior lateral infarct and nonspecific ST changes was personally reviewed   Laboratory Data:  High Sensitivity Troponin:   Recent Labs  Lab 10/05/20 1213 10/05/20 1731 10/05/20 1928 10/05/20 2324  TROPONINIHS 32* 499* 1,503* 2,030*      Chemistry Recent Labs  Lab 10/05/20 1213 10/06/20 0425  NA 136 136  K 3.8 3.6  CL 95* 98  CO2 26 26  GLUCOSE 159* 110*  BUN 13 12  CREATININE 0.96 0.76  CALCIUM 9.6 9.1  GFRNONAA >60 >60  ANIONGAP 15 12    Hematology Recent Labs  Lab 10/05/20 1213 10/06/20 0425  WBC 5.8 7.9  RBC 4.12* 3.68*  HGB 14.1 12.7*  HCT 40.9 36.4*  MCV 99.3 98.9  MCH 34.2* 34.5*  MCHC 34.5 34.9  RDW 12.3 12.4  PLT 177 178    Radiology/Studies:  CARDIAC  CATHETERIZATION  Result Date: 10/06/2020  2nd Mrg lesion is 80% stenosed.  1st Mrg lesion is 50% stenosed.  Mid LAD lesion is 80% stenosed.  Prox LAD to Mid LAD lesion is 70% stenosed.  1st Diag lesion is 80% stenosed.  Mid RCA lesion is 75% stenosed.  Dist RCA lesion is 50% stenosed.  1.  Non-ST elevation myocardial infarction 2.  Three-vessel coronary artery disease 3.  Mildly reduced left ventricular function Recommendations 1.  Transfer [facility] for [facility]G   DG Chest Portable 1 View  Result Date: 10/05/2020 CLINICAL DATA:  Chest pain. EXAM: PORTABLE CHEST 1 VIEW COMPARISON:  None. FINDINGS:  The heart size and mediastinal contours are within normal limits. Both lungs are clear. The visualized skeletal structures are unremarkable. IMPRESSION: No active disease. Electronically Signed   By: Dorise Bullion III M.D   On: 10/05/2020 18:15     Assessment and Plan:   1. NSTEMI-patient ruled in for non-ST elevation myocardial infarction.  Cardiac catheterization reveals severe three-vessel coronary artery disease.  Patient will need consult by cardiothoracic surgery for coronary artery bypass and graft.  We will treat with aspirin.  Resume heparin this evening without bolus.  Continue beta-blocker, statin and Nitropaste.  Presently pain-free.  Check echocardiogram for LV function. 2. Hypertension-we will continue losartan and metoprolol but discontinue hydrochlorothiazide.  Follow blood pressure and adjust regimen as needed. 3. Hyperlipidemia-patient appears to be statin intolerant.  We will discontinue all statins as a cause of leg aching.  Will consider Zetia or Repatha as an outpatient. 4. Hypothyroid-continue levothyroxine home dose. [id number]} TIMI Risk Score for Unstable Angina or Non-ST Elevation MI:   The patient's TIMI risk score is 5, which indicates a 26% risk of all cause mortality, new or recurrent myocardial infarction or need for urgent revascularization in the next 14  days.      Severity of Illness: The appropriate patient status for this patient is INPATIENT. Inpatient status is judged to be reasonable and necessary in order to provide the required intensity of service to ensure the patient's safety. The patient's presenting symptoms, physical exam findings, and initial radiographic and laboratory data in the context of their chronic comorbidities is felt to place them at high risk for further clinical deterioration. Furthermore, it is not anticipated that the patient will be medically stable for discharge from the hospital within 2 midnights of admission. The following factors support the patient status of inpatient.   " The patient's presenting symptoms include chest pain. " The worrisome physical exam findings include right groin with no hematoma following cath. " The initial radiographic and laboratory data are worrisome because of positive troponin. " The chronic co-morbidities include CAD.   * I certify that at the point of admission it is my clinical judgment that the patient will require inpatient hospital care spanning beyond 2 midnights from the point of admission due to high intensity of service, high risk for further deterioration and high frequency of surveillance required.*    For questions or updates, please contact [place] Please consult [email or web address] for contact info under     Signed, Kirk Ruths, MD  10/06/2020 5:28 PM

## 2020-10-06 NOTE — Consult Note (Signed)
ANTICOAGULATION CONSULT NOTE - Initial Consult  Pharmacy Consult for heparin infusion Indication: chest pain/ACS  Allergies  Allergen Reactions  . Tetracyclines & Related Diarrhea  . Nitrofurantoin Rash    Patient Measurements: Height: 5\' 8"  (172.7 cm) Weight: 90.7 kg (200 lb) IBW/kg (Calculated) : 68.4 Heparin Dosing Weight: 87 kg  Vital Signs: BP: 150/77 (01/10 0400) Pulse Rate: 61 (01/10 0445)  Labs: Recent Labs    10/05/20 1213 10/05/20 1731 10/05/20 1813 10/05/20 1928 10/05/20 2324 10/06/20 0235 10/06/20 0425  HGB 14.1  --   --   --   --   --  12.7*  HCT 40.9  --   --   --   --   --  36.4*  PLT 177  --   --   --   --   --  178  APTT  --   --  26  --   --   --   --   LABPROT  --   --  12.5  --   --   --   --   INR  --   --  1.0  --   --   --   --   HEPARINUNFRC  --   --   --   --   --  0.63  --   CREATININE 0.96  --   --   --   --   --   --   TROPONINIHS 32* 499*  --  1,503* 2,030*  --   --     Estimated Creatinine Clearance: 66 mL/min (by C-G formula based on SCr of 0.96 mg/dL).   Medical History: Past Medical History:  Diagnosis Date  . Colon polyps   . Coronary artery disease   . Depression   . Headache   . Hyperlipemia   . Hypertension   . 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 Tubular adenoma of colon      Assessment: 80 y.o. male with medical history significant of CAD s/p PCI x 2, HLD, HTN, presented with chest pain. Pharmacy gas been consulted for heparin infusion for ACS/chest pain indication. Baseline labs ordered and reviewed.  01/10 0235 HL = 0.63, therapeutic  Goal of Therapy:  Heparin level 0.3-0.7 units/ml Monitor platelets by anticoagulation protocol: Yes    Plan:  Continue heparin infusion at 1150 units/hr Recheck anti-Xa level in 8 hours and daily while on heparin Continue to monitor H&H and platelets  Renda Rolls, PharmD, [facility] 10/06/2020 5:02 AM

## 2020-10-06 NOTE — Discharge Summary (Signed)
[facility] Discharge Summary   Patient: Andrew Richardson ION:[id number]  PCP: Derinda Late, MD  Date of admission: 10/05/2020   Date of discharge: 10/06/2020 10/06/2020     Discharge Diagnoses:  Principal diagnosis NSTEMI Active Problems:   NSTEMI (non-ST elevated myocardial infarction) ([place])   CAD S/P percutaneous coronary angioplasty   HTN (hypertension)   HLD (hyperlipidemia)   Admitted From: Home Disposition:  [place] for Patient under cardiology service  Recommendations for Outpatient Follow-up:  1. PCP: in 1 wk after d/c 2. Follow up LABS/TEST:     Diet recommendation: Cardiac diet  Activity: The patient is advised to gradually reintroduce usual activities, as tolerated  Discharge Condition: stable  Code Status: Full code   History of present illness: As per the H and P dictated on admission  Hospital Course:  Andrew Richardson is a 80 y.o. male with medical history significant of CAD s/p PCI x 2, HLD, HTN was awakened by substernal chest pain described as a pressure with radiation to the right arm. Denies diaphoresis, SOB. Pain lasted until he was brought by EMS to [facility]ED. EMT had placed NTG tape.  ED Course: T97.7  154/75   HR 62  RR 12  BMI 30. ED-PE unremarkable. Lab - Bmet with Glucose 159, CBC nl, Troponin # 1 32, #2 499. EEKG w/o acute changes. CXR NAD. In ED heparin bolus and infusion started. Dr. Saralyn Pilar consulted. [facility] called to admit patient for mgt of NSTEMI.   Active Problems:   NSTEMI (non-ST elevated myocardial infarction) ([place])   CAD S/P percutaneous coronary angioplasty   HTN (hypertension)   HLD (hyperlipidemia)   NSTEMI - patient with known CAD s/p PCI w/o stent x 2. Now with acute onset chest pain relieved by NTG. Troponins trending up: 32 to 499. S/p Heparin infusion. Cardiology consulted, s/p cardiac cath, cardiology decided to transferred to [facility] for CABG. patient was seen after the cardiac cath, tolerated procedure well,  patient was placed chest pain-free and denied any active issues.  Agreed for the transfer to [facility]. Home medications continued inpatient was transferred to [facility]. Body mass index is 30.41 kg/m.     Consultants: Cardiology    Procedures: Cardiac cath  Discharge Exam: General: Appear in mild distress, no Rash; Oral Mucosa Clear, moist. Cardiovascular: S1 and S2 Present, no Murmur, Respiratory: normal respiratory effort, Bilateral Air entry present and no Crackles, no wheezes Abdomen: Bowel Sound present, Soft and no tenderness, no hernia Extremities: no Pedal edema, no calf tenderness Neurology: alert and oriented to time, place, and person affect appropriate.  Filed Weights   10/05/20 1204 10/06/20 1026  Weight: 90.7 kg 90.7 kg   Vitals:   10/06/20 1540 10/06/20 1545  BP:  (!) 148/79  Pulse:    Resp: 19 20  Temp:    SpO2:      DISCHARGE MEDICATION: Allergies as of 10/06/2020      Reactions   Tetracyclines & Related Diarrhea   Nitrofurantoin Rash      Medication List    TAKE these medications   aspirin EC 81 MG EC tablet Generic drug: aspirin Take 81 mg by mouth as needed for pain. Swallow whole.   hydrochlorothiazide 25 MG tablet Commonly known as: HYDRODIURIL Take 25 mg by mouth daily.   HYDROcodone-acetaminophen 5-325 MG tablet Commonly known as: Norco Take 1-2 tablets by mouth every 4 (four) hours as needed for moderate pain.   losartan 100 MG tablet Commonly known as: COZAAR  Take 100 mg by mouth daily.   metoprolol tartrate 50 MG tablet Commonly known as: LOPRESSOR Take 50 mg by mouth 2 (two) times daily.   simvastatin 40 MG tablet Commonly known as: ZOCOR Take 40 mg by mouth every evening.   tamsulosin 0.4 MG Caps capsule Commonly known as: FLOMAX Take 0.4 mg by mouth daily.      Allergies  Allergen Reactions  . Tetracyclines & Related Diarrhea  . Nitrofurantoin Rash   Discharge Instructions    Diet - low sodium heart healthy    Complete by: As directed    Increase activity slowly   Complete by: As directed       The results of significant diagnostics from this hospitalization (including imaging, microbiology, ancillary and laboratory) are listed below for reference.    Significant Diagnostic Studies: CARDIAC CATHETERIZATION  Result Date: 10/06/2020  2nd Mrg lesion is 80% stenosed.  1st Mrg lesion is 50% stenosed.  Mid LAD lesion is 80% stenosed.  Prox LAD to Mid LAD lesion is 70% stenosed.  1st Diag lesion is 80% stenosed.  Mid RCA lesion is 75% stenosed.  Dist RCA lesion is 50% stenosed.  1.  Non-ST elevation myocardial infarction 2.  Three-vessel coronary artery disease 3.  Mildly reduced left ventricular function Recommendations 1.  Transfer to [facility] for CABG   DG Chest Portable 1 View  Result Date: 10/05/2020 CLINICAL DATA:  Chest pain. EXAM: PORTABLE CHEST 1 VIEW COMPARISON:  None. FINDINGS: The heart size and mediastinal contours are within normal limits. Both lungs are clear. The visualized skeletal structures are unremarkable. IMPRESSION: No active disease. Electronically Signed   By: Dorise Bullion III M.D   On: 10/05/2020 18:15    Microbiology: Recent Results (from the past 240 hour(s))  SARS CORONAVIRUS 2 (TAT 6-24 HRS) Nasopharyngeal Nasopharyngeal Swab     Status: None   Collection Time: 10/05/20  6:13 PM   Specimen: Nasopharyngeal Swab  Result Value Ref Range Status   SARS Coronavirus 2 NEGATIVE NEGATIVE Final    Comment: (NOTE) SARS-CoV-2 target nucleic acids are NOT DETECTED.  The SARS-CoV-2 RNA is generally detectable in upper and lower respiratory specimens during the acute phase of infection. Negative results do not preclude SARS-CoV-2 infection, do not rule out co-infections with other pathogens, and should not be used as the sole basis for treatment or other patient management decisions. Negative results must be combined with clinical observations, patient  history, and epidemiological information. The expected result is Negative.  Fact Sheet for Patients: SugarRoll.be  Fact Sheet for Healthcare Providers: https://www.woods-mathews.com/  This test is not yet approved or cleared by the [place] FDA and  has been authorized for detection and/or diagnosis of SARS-CoV-2 by FDA under an Emergency Use Authorization (EUA). This EUA will remain  in effect (meaning this test can be used) for the duration of the COVID-19 declaration under Se ction 564(b)(1) of the Act, 21 U.S.C. section 360bbb-3(b)(1), unless the authorization is terminated or revoked sooner.  Performed at [facility], [place] [address], [place], [place] [address]      Labs: CBC: Recent Labs  Lab 10/05/20 1213 10/06/20 0425  WBC 5.8 7.9  HGB 14.1 12.7*  HCT 40.9 36.4*  MCV 99.3 98.9  PLT 177 119   Basic Metabolic Panel: Recent Labs  Lab 10/05/20 1213 10/06/20 0425  NA 136 136  K 3.8 3.6  CL 95* 98  CO2 26 26  GLUCOSE 159* 110*  BUN 13 12  CREATININE 0.96 0.76  CALCIUM 9.6 9.1   Liver Function Tests: No results for input(s): AST, ALT, ALKPHOS, BILITOT, PROT, ALBUMIN in the last 168 hours. No results for input(s): LIPASE, AMYLASE in the last 168 hours. No results for input(s): AMMONIA in the last 168 hours. Cardiac Enzymes: No results for input(s): CKTOTAL, CKMB, CKMBINDEX, TROPONINI in the last 168 hours. BNP (last 3 results) No results for input(s): BNP in the last 8760 hours. CBG: No results for input(s): GLUCAP in the last 168 hours.  Time spent: 35 minutes  Signed:  Val Riles  [facility] 10/06/2020  4:16 PM

## 2020-10-06 NOTE — Progress Notes (Signed)
ANTICOAGULATION CONSULT NOTE - Initial Consult  Pharmacy Consult for Heparin Indication: 3vCAD awaiting CABG  Allergies  Allergen Reactions  . Tetracyclines & Related Diarrhea  . Nitrofurantoin Rash    Patient Measurements: Height: 5\' 8"  (172.7 cm) Weight: 90.7 kg (199 lb 15.3 oz) IBW/kg (Calculated) : 68.4 Heparin Dosing Weight: 87 kg  Vital Signs: Temp: 98 F (36.7 C) (01/10 1653) Temp Source: Oral (01/10 1653) BP: 153/74 (01/10 1653) Pulse Rate: 63 (01/10 1653)  Labs: Recent Labs    10/05/20 1213 10/05/20 1731 10/05/20 1813 10/05/20 1928 10/05/20 2324 10/06/20 0235 10/06/20 0425  HGB 14.1  --   --   --   --   --  12.7*  HCT 40.9  --   --   --   --   --  36.4*  PLT 177  --   --   --   --   --  178  APTT  --   --  26  --   --   --   --   LABPROT  --   --  12.5  --   --   --   --   INR  --   --  1.0  --   --   --   --   HEPARINUNFRC  --   --   --   --   --  0.63  --   CREATININE 0.96  --   --   --   --   --  0.76  TROPONINIHS 32* 499*  --  1,503* 2,030*  --   --     Estimated Creatinine Clearance: 79.2 mL/min (by C-G formula based on SCr of 0.76 mg/dL).   Medical History: Past Medical History:  Diagnosis Date  . Colon polyps   . Coronary artery disease   . Depression   . Headache   . Hyperlipemia   . Hypertension   . Hypothyroid   . Prediabetes   . 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 Tubular adenoma of colon     Assessment: 43 YOM who presented to [facility] with NSTEMI and found via cath to have 3vCAD - transferred to [facility] for CABG evaluation. Pharmacy consulted to resume Heparin on 1/10 @ 2100.  Goal of Therapy:  Heparin level 0.3-0.7 units/ml Monitor platelets by anticoagulation protocol: Yes   Plan:  - Start Heparin at 1100 units/hr starting at 2100 this evening - Daily HL/CBC - Will continue to monitor for any signs/symptoms of bleeding and will follow up with heparin level in 8 hours after starting  Thank you for allowing pharmacy  to be a part of this patient's care.  Alycia Rossetti, PharmD, BCPS Clinical Pharmacist Clinical phone for 10/06/2020: [phone] 10/06/2020 6:19 PM   **Pharmacist phone directory can now be found on [email or web address] ([email or web address]).  Listed under [place].

## 2020-10-06 NOTE — Consult Note (Signed)
[facility] Cardiology  CARDIOLOGY CONSULT NOTE  Patient ID: Andrew Richardson MRN: [id number] DOB/AGE: 1941/03/17 80 y.o.  Admit date: 10/05/2020 Referring Physician [place] Primary Physician [facility] Primary Cardiologist Avery Klingbeil Reason for Consultation non-ST elevation myocardial infarction  HPI: 80 year old gentleman referred for evaluation of non-ST elevation myocardial infarction.  The patient was in his usual state of health until 10/05/2020 when he awoke with substernal chest discomfort.  Patient describes substernal chest tightness.  The patient presented to [facility] emergency room where ECG revealed sinus rhythm with nonspecific ST-T wave abnormalities.  After arrival to the ED, the patient was treated with sublingual nitroglycerin with relief of chest pain.  The patient has had no recurrent chest pain, on heparin infusion.  The patient ruled in with non-ST elevation myocardial infarction with high-sensitivity troponin of 32, 499, 1503, 2030.  The patient has known coronary artery disease, status post PTCA 67 in 1998.  Review of systems complete and found to be negative unless listed above     Past Medical History:  Diagnosis Date  . Colon polyps   . Coronary artery disease   . Depression   . Headache   . Hyperlipemia   . Hypertension   . 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 Tubular adenoma of colon     Past Surgical History:  Procedure Laterality Date  . ANGIOPLASTY    . COLONOSCOPY    . COLONOSCOPY WITH PROPOFOL N/A 05/20/2015   Procedure: COLONOSCOPY WITH PROPOFOL;  Surgeon: Lollie Sails, MD;  Location: [facility] ENDOSCOPY;  Service: Endoscopy;  Laterality: N/A;  . INGUINAL HERNIA REPAIR Left 10/31/2015   Procedure: HERNIA REPAIR INGUINAL ADULT;  Surgeon: Leonie Green, MD;  Location: [facility] ORS;  Service: General;  Laterality: Left;    (Not in a hospital admission)  Social History   Socioeconomic History  . Marital status: Married    Spouse name: Not on file  .  Number of children: Not on file  . Years of education: Not on file  . Highest education level: Not on file  Occupational History  . Not on file  Tobacco Use  . Smoking status: Former Smoker    Quit date: 05/19/1989    Years since quitting: 31.4  . Smokeless tobacco: Former Network engineer and Sexual Activity  . Alcohol use: Yes    Alcohol/week: 3.0 standard drinks    Types: 3 Shots of liquor per week  . Drug use: No  . Sexual activity: Not on file  Other Topics Concern  . Not on file  Social History Narrative  . Not on file   Social Determinants of Health   Financial Resource Strain: Not on file  Food Insecurity: Not on file  Transportation Needs: Not on file  Physical Activity: Not on file  Stress: Not on file  Social Connections: Not on file  Intimate Partner Violence: Not on file    No family history on file.    Review of systems complete and found to be negative unless listed above      PHYSICAL EXAM  General: Well developed, well nourished, in no acute distress HEENT:  Normocephalic and atramatic Neck:  No JVD.  Lungs: Clear bilaterally to auscultation and percussion. Heart: HRRR . Normal S1 and S2 without gallops or murmurs.  Abdomen: Bowel sounds are positive, abdomen soft and non-tender  Msk:  Back normal, normal gait. Normal strength and tone for age. Extremities: No clubbing, cyanosis or edema.   Neuro: Alert and oriented X  3. Psych:  Good affect, responds appropriately  Labs:   Lab Results  Component Value Date   WBC 7.9 10/06/2020   HGB 12.7 (L) 10/06/2020   HCT 36.4 (L) 10/06/2020   MCV 98.9 10/06/2020   PLT 178 10/06/2020    Recent Labs  Lab 10/06/20 0425  NA 136  K 3.6  CL 98  CO2 26  BUN 12  CREATININE 0.76  CALCIUM 9.1  GLUCOSE 110*   No results found for: CKTOTAL, CKMB, CKMBINDEX, TROPONINI  Lab Results  Component Value Date   CHOL 175 10/06/2020   Lab Results  Component Value Date   HDL 67 10/06/2020   Lab Results   Component Value Date   LDLCALC 93 10/06/2020   Lab Results  Component Value Date   TRIG 73 10/06/2020   Lab Results  Component Value Date   CHOLHDL 2.6 10/06/2020   No results found for: LDLDIRECT    Radiology: DG Chest Portable 1 View  Result Date: 10/05/2020 CLINICAL DATA:  Chest pain. EXAM: PORTABLE CHEST 1 VIEW COMPARISON:  None. FINDINGS: The heart size and mediastinal contours are within normal limits. Both lungs are clear. The visualized skeletal structures are unremarkable. IMPRESSION: No active disease. Electronically Signed   By: Dorise Bullion III M.D   On: 10/05/2020 18:15    EKG: ECG revealed sinus rhythm nonspecific ST-T wave abnormalities  ASSESSMENT AND PLAN:   1.  Non-ST elevation myocardial infarction.  Patient has known coronary artery disease, presents with new onset chest pain, with nondiagnostic ECG.  Recommendations  1.  Agree with current therapy 2.  Continue heparin infusion 3.  Seed with cardiac catheterization and possible PCI.  The risk, benefits alternatives of cardiac catheterization were explained to the patient and informed written consent was obtained.  Signed: Isaias Cowman MD,PhD, [facility] 10/06/2020, 8:18 AM

## 2020-10-06 NOTE — OR Nursing (Signed)
Andrew Richardson from [facility] given report for transport to [facility] rm 20. report called to Antonietta Jewel RN [facility].

## 2020-10-06 NOTE — ED Notes (Signed)
MD Paraschos at bedside. 

## 2020-10-06 NOTE — Progress Notes (Signed)
     [address]       [place] [address]             [phone]       Full consult note to follow 80 year old male who presents with an NSTEMI and found to have three-vessel coronary disease.  On review of his images he has got good targets on the anterior and posterior walls.  The circumflex distribution shows a small runoff.  We discussed the risks and benefits of surgical revascularization and he would like some time to think about it.  His major concern is his ability to rehab at home given that he does not have any family in the area and his wife is somewhat debilitated.  If he agrees to surgery the earliest that we can proceed would be 10/08/2020, but will I will rediscuss options with him tomorrow.  Yasuko Lapage Bary Leriche

## 2020-10-07 ENCOUNTER — Inpatient Hospital Stay (HOSPITAL_COMMUNITY): Payer: Medicare Other

## 2020-10-07 ENCOUNTER — Encounter: Payer: Self-pay | Admitting: Cardiology

## 2020-10-07 DIAGNOSIS — I214 Non-ST elevation (NSTEMI) myocardial infarction: Secondary | ICD-10-CM

## 2020-10-07 DIAGNOSIS — I5189 Other ill-defined heart diseases: Secondary | ICD-10-CM

## 2020-10-07 DIAGNOSIS — Z01810 Encounter for preprocedural cardiovascular examination: Secondary | ICD-10-CM | POA: Diagnosis not present

## 2020-10-07 DIAGNOSIS — I2510 Atherosclerotic heart disease of native coronary artery without angina pectoris: Secondary | ICD-10-CM | POA: Diagnosis not present

## 2020-10-07 DIAGNOSIS — I25110 Atherosclerotic heart disease of native coronary artery with unstable angina pectoris: Secondary | ICD-10-CM

## 2020-10-07 HISTORY — DX: Other ill-defined heart diseases: I51.89

## 2020-10-07 LAB — CBC
HCT: 37.5 % — ABNORMAL LOW (ref 39.0–52.0)
Hemoglobin: 12.8 g/dL — ABNORMAL LOW (ref 13.0–17.0)
MCH: 34.3 pg — ABNORMAL HIGH (ref 26.0–34.0)
MCHC: 34.1 g/dL (ref 30.0–36.0)
MCV: 100.5 fL — ABNORMAL HIGH (ref 80.0–100.0)
Platelets: 170 10*3/uL (ref 150–400)
RBC: 3.73 MIL/uL — ABNORMAL LOW (ref 4.22–5.81)
RDW: 12.4 % (ref 11.5–15.5)
WBC: 6.4 10*3/uL (ref 4.0–10.5)
nRBC: 0 % (ref 0.0–0.2)

## 2020-10-07 LAB — ECHOCARDIOGRAM COMPLETE
Area-P 1/2: 2.48 cm2
Calc EF: 54.6 %
Height: 68 in
S' Lateral: 3.7 cm
Single Plane A2C EF: 57.9 %
Single Plane A4C EF: 47.8 %
Weight: 3075.2 oz

## 2020-10-07 LAB — HEMOGLOBIN A1C
Hgb A1c MFr Bld: 5.7 % — ABNORMAL HIGH (ref 4.8–5.6)
Mean Plasma Glucose: 116.89 mg/dL

## 2020-10-07 LAB — TYPE AND SCREEN
ABO/RH(D): A POS
Antibody Screen: NEGATIVE

## 2020-10-07 LAB — BASIC METABOLIC PANEL
Anion gap: 8 (ref 5–15)
BUN: 12 mg/dL (ref 8–23)
CO2: 27 mmol/L (ref 22–32)
Calcium: 9.4 mg/dL (ref 8.9–10.3)
Chloride: 98 mmol/L (ref 98–111)
Creatinine, Ser: 0.91 mg/dL (ref 0.61–1.24)
GFR, Estimated: >60 mL/min (ref >60)
Glucose, Bld: 109 mg/dL — ABNORMAL HIGH (ref 70–99)
Potassium: 3.9 mmol/L (ref 3.5–5.1)
Sodium: 133 mmol/L — ABNORMAL LOW (ref 135–145)

## 2020-10-07 LAB — ABO/RH: ABO/RH(D): A POS

## 2020-10-07 LAB — HEPARIN LEVEL (UNFRACTIONATED): Heparin Unfractionated: 0.3 IU/mL (ref 0.30–0.70)

## 2020-10-07 MED ORDER — CHLORHEXIDINE GLUCONATE CLOTH 2 % EX PADS
6.0000 | MEDICATED_PAD | Freq: Once | CUTANEOUS | Status: AC
  Administered 2020-10-08: 6 via TOPICAL

## 2020-10-07 MED ORDER — NOREPINEPHRINE 4 MG/250ML-% IV SOLN
0.0000 ug/min | INTRAVENOUS | Status: DC
  Filled 2020-10-07: qty 250

## 2020-10-07 MED ORDER — PLASMA-LYTE 148 IV SOLN
INTRAVENOUS | Status: DC
  Filled 2020-10-07: qty 2.5

## 2020-10-07 MED ORDER — CHLORHEXIDINE GLUCONATE CLOTH 2 % EX PADS
6.0000 | MEDICATED_PAD | Freq: Once | CUTANEOUS | Status: AC
  Administered 2020-10-07: 6 via TOPICAL

## 2020-10-07 MED ORDER — PHENYLEPHRINE HCL-NACL 20-0.9 MG/250ML-% IV SOLN
30.0000 ug/min | INTRAVENOUS | Status: DC
  Filled 2020-10-07: qty 250

## 2020-10-07 MED ORDER — EPINEPHRINE HCL 5 MG/250ML IV SOLN IN NS
0.0000 ug/min | INTRAVENOUS | Status: DC
  Filled 2020-10-07: qty 250

## 2020-10-07 MED ORDER — SODIUM CHLORIDE 0.9 % IV SOLN
750.0000 mg | INTRAVENOUS | Status: AC
  Administered 2020-10-08: 750 mg via INTRAVENOUS
  Filled 2020-10-07: qty 750

## 2020-10-07 MED ORDER — TRANEXAMIC ACID (OHS) BOLUS VIA INFUSION
15.0000 mg/kg | INTRAVENOUS | Status: AC
  Administered 2020-10-08: 1308 mg via INTRAVENOUS
  Filled 2020-10-07: qty 1308

## 2020-10-07 MED ORDER — TEMAZEPAM 15 MG PO CAPS: 15.0000 mg | ORAL_CAPSULE | Freq: Once | ORAL | Status: DC | PRN

## 2020-10-07 MED ORDER — TRANEXAMIC ACID 1000 MG/10ML IV SOLN
1.5000 mg/kg/h | INTRAVENOUS | Status: AC
  Administered 2020-10-08: 1.5 mg/kg/h via INTRAVENOUS
  Filled 2020-10-07: qty 25

## 2020-10-07 MED ORDER — METOPROLOL TARTRATE 12.5 MG HALF TABLET
12.5000 mg | ORAL_TABLET | Freq: Once | ORAL | Status: AC
  Administered 2020-10-08: 12.5 mg via ORAL
  Filled 2020-10-07: qty 1

## 2020-10-07 MED ORDER — MANNITOL 20 % IV SOLN
INTRAVENOUS | Status: DC
  Filled 2020-10-07: qty 13

## 2020-10-07 MED ORDER — MILRINONE LACTATE IN DEXTROSE 20-5 MG/100ML-% IV SOLN
0.3000 ug/kg/min | INTRAVENOUS | Status: DC
  Filled 2020-10-07: qty 100

## 2020-10-07 MED ORDER — POTASSIUM CHLORIDE 2 MEQ/ML IV SOLN
80.0000 meq | INTRAVENOUS | Status: DC
  Filled 2020-10-07: qty 40

## 2020-10-07 MED ORDER — BISACODYL 5 MG PO TBEC
5.0000 mg | DELAYED_RELEASE_TABLET | Freq: Once | ORAL | Status: AC
  Administered 2020-10-07: 5 mg via ORAL
  Filled 2020-10-07: qty 1

## 2020-10-07 MED ORDER — SODIUM CHLORIDE 0.9 % IV SOLN
INTRAVENOUS | Status: DC
  Filled 2020-10-07 (×2): qty 30

## 2020-10-07 MED ORDER — INSULIN REGULAR(HUMAN) IN NACL 100-0.9 UT/100ML-% IV SOLN
INTRAVENOUS | Status: AC
  Administered 2020-10-08: .8 [IU]/h via INTRAVENOUS
  Filled 2020-10-07: qty 100

## 2020-10-07 MED ORDER — TRANEXAMIC ACID (OHS) PUMP PRIME SOLUTION
2.0000 mg/kg | INTRAVENOUS | Status: DC
  Filled 2020-10-07: qty 1.74

## 2020-10-07 MED ORDER — DEXMEDETOMIDINE HCL IN NACL 400 MCG/100ML IV SOLN
0.1000 ug/kg/h | INTRAVENOUS | Status: DC
  Filled 2020-10-07: qty 100

## 2020-10-07 MED ORDER — SODIUM CHLORIDE 0.9 % IV SOLN
1.5000 g | INTRAVENOUS | Status: AC
  Administered 2020-10-08: 1.5 g via INTRAVENOUS
  Filled 2020-10-07: qty 1.5

## 2020-10-07 MED ORDER — VANCOMYCIN HCL 1500 MG/300ML IV SOLN
1500.0000 mg | INTRAVENOUS | Status: AC
  Administered 2020-10-08: 1500 mg via INTRAVENOUS
  Filled 2020-10-07: qty 300

## 2020-10-07 MED ORDER — CHLORHEXIDINE GLUCONATE 0.12 % MT SOLN
15.0000 mL | Freq: Once | OROMUCOSAL | Status: AC
  Administered 2020-10-08: 15 mL via OROMUCOSAL
  Filled 2020-10-07: qty 15

## 2020-10-07 MED ORDER — NITROGLYCERIN IN D5W 200-5 MCG/ML-% IV SOLN
2.0000 ug/min | INTRAVENOUS | Status: AC
  Administered 2020-10-08: 10 ug/min via INTRAVENOUS
  Filled 2020-10-07: qty 250

## 2020-10-07 NOTE — Progress Notes (Signed)
Progress Note  Patient Name: Andrew Richardson Date of Encounter: 10/07/2020  [facility] Cardiologist: Dr Saralyn Pilar  Subjective   No CP or dyspnea  Inpatient Medications    Scheduled Meds: . amLODipine  5 mg Oral Daily  . aspirin EC  81 mg Oral Daily  . atorvastatin  80 mg Oral Daily  . levothyroxine  50 mcg Oral Q0600  . losartan  100 mg Oral Daily  . metoprolol tartrate  25 mg Oral BID  . nitroGLYCERIN  1 inch Topical Q6H  . tamsulosin  0.4 mg Oral Daily   Continuous Infusions: . heparin 1,100 Units/hr (10/06/20 2123)   PRN Meds: acetaminophen, nitroGLYCERIN, ondansetron (ZOFRAN) IV   Vital Signs    Vitals:   10/06/20 1800 10/06/20 1959 10/07/20 0010 10/07/20 0542  BP:  (!) 143/73 132/68 139/83  Pulse:  73 62 65  Resp:  18 13 16   Temp:  97.6 F (36.4 C) 98.1 F (36.7 C) 98 F (36.7 C)  TempSrc:  Oral Oral Oral  SpO2:  95% 98% 98%  Weight: 90.7 kg   87.2 kg  Height: 5\' 8"  (1.727 m)       Intake/Output Summary (Last 24 hours) at 10/07/2020 0814 Last data filed at 10/07/2020 0542 Gross per 24 hour  Intake 80.78 ml  Output 1300 ml  Net -1219.22 ml   Last 3 Weights 10/07/2020 10/06/2020 10/06/2020  Weight (lbs) 192 lb 3.2 oz 199 lb 15.3 oz 200 lb  Weight (kg) 87.181 kg 90.7 kg 90.719 kg      Telemetry    Sinus- Personally Reviewed   Physical Exam   GEN: No acute distress.   Neck: No JVD Cardiac: RRR, no murmurs, rubs, or gallops.  Respiratory: Clear to auscultation bilaterally. GI: Soft, nontender, non-distended, right groin with no hematoma and no bruit MS: No edema Neuro:  Nonfocal  Psych: Normal affect   Labs    High Sensitivity Troponin:   Recent Labs  Lab 10/05/20 1213 10/05/20 1731 10/05/20 1928 10/05/20 2324  TROPONINIHS 32* 499* 1,503* 2,030*      Chemistry Recent Labs  Lab 10/05/20 1213 10/06/20 0425 10/07/20 0301  NA 136 136 133*  K 3.8 3.6 3.9  CL 95* 98 98  CO2 26 26 27   GLUCOSE 159* 110* 109*  BUN 13 12 12    CREATININE 0.96 0.76 0.91  CALCIUM 9.6 9.1 9.4  GFRNONAA >60 >60 >60  ANIONGAP 15 12 8      Hematology Recent Labs  Lab 10/05/20 1213 10/06/20 0425 10/07/20 0301  WBC 5.8 7.9 6.4  RBC 4.12* 3.68* 3.73*  HGB 14.1 12.7* 12.8*  HCT 40.9 36.4* 37.5*  MCV 99.3 98.9 100.5*  MCH 34.2* 34.5* 34.3*  MCHC 34.5 34.9 34.1  RDW 12.3 12.4 12.4  PLT 177 178 170    Radiology    CARDIAC CATHETERIZATION  Result Date: 10/06/2020  2nd Mrg lesion is 80% stenosed.  1st Mrg lesion is 50% stenosed.  Mid LAD lesion is 80% stenosed.  Prox LAD to Mid LAD lesion is 70% stenosed.  1st Diag lesion is 80% stenosed.  Mid RCA lesion is 75% stenosed.  Dist RCA lesion is 50% stenosed.  1.  Non-ST elevation myocardial infarction 2.  Three-vessel coronary artery disease 3.  Mildly reduced left ventricular function Recommendations 1.  Transfer to [facility] for CABG   DG Chest Portable 1 View  Result Date: 10/05/2020 CLINICAL DATA:  Chest pain. EXAM: PORTABLE CHEST 1 VIEW COMPARISON:  None.  FINDINGS: The heart size and mediastinal contours are within normal limits. Both lungs are clear. The visualized skeletal structures are unremarkable. IMPRESSION: No active disease. Electronically Signed   By: Dorise Bullion III M.D   On: 10/05/2020 18:15    Patient Profile     80 y.o. male with past medical history of coronary artery disease, hypertension, hyperlipidemia, hypothyroid transferred from [facility] regional for coronary artery bypass and graft. Cardiac catheterization performed 10/07/19 showed an 80% OM 2, 50% OM1, 80% mid LAD, 70% proximal LAD, 80% D1, 75% mid RCA, 50% distal RCA.  Mild RV dysfunction.     Assessment & Plan    1. NSTEMI-patient ruled in for non-patient remains pain-free.  As outlined previously cardiac catheterization reveals severe three-vessel coronary artery disease.  Cardiothoracic surgery is evaluating for possible coronary artery bypass and graft.  Continue aspirin, heparin,  beta-blocker and nitrates.  Intolerant to statins.  Await echocardiogram for LV function.   2. Hypertension-continue present medications and adjust based on follow-up readings. 3. Hyperlipidemia-discontinue Lipitor as patient has had myalgias with statins. Will consider Zetia or Repatha as an outpatient. 4. Hypothyroid-continue levothyroxine home dose.   For questions or updates, please contact [place] Please consult [email or web address] for contact info under        Signed, Kirk Ruths, MD  10/07/2020, 8:14 AM

## 2020-10-07 NOTE — Progress Notes (Signed)
[place] for Heparin Indication: 3vCAD awaiting CABG  Allergies  Allergen Reactions  . Simvastatin Other (See Comments)    Fatigue and muscle pain/weakness  . Tetracyclines & Related Diarrhea  . Nitrofurantoin Rash    Patient Measurements: Height: 5\' 8"  (172.7 cm) Weight: 87.2 kg (192 lb 3.2 oz) IBW/kg (Calculated) : 68.4 Heparin Dosing Weight: 87 kg  Vital Signs: Temp: 98 F (36.7 C) (01/11 0542) Temp Source: Oral (01/11 0542) BP: 139/83 (01/11 0542) Pulse Rate: 65 (01/11 0542)  Labs: Recent Labs    10/05/20 1213 10/05/20 1731 10/05/20 1813 10/05/20 1928 10/05/20 2324 10/06/20 0235 10/06/20 0425 10/07/20 0301 10/07/20 0620  HGB 14.1  --   --   --   --   --  12.7* 12.8*  --   HCT 40.9  --   --   --   --   --  36.4* 37.5*  --   PLT 177  --   --   --   --   --  178 170  --   APTT  --   --  26  --   --   --   --   --   --   LABPROT  --   --  12.5  --   --   --   --   --   --   INR  --   --  1.0  --   --   --   --   --   --   HEPARINUNFRC  --   --   --   --   --  0.63  --   --  0.30  CREATININE 0.96  --   --   --   --   --  0.76 0.91  --   TROPONINIHS 32* 499*  --  1,503* 2,030*  --   --   --   --     Estimated Creatinine Clearance: 68.3 mL/min (by C-G formula based on SCr of 0.91 mg/dL).   Medical History: Past Medical History:  Diagnosis Date  . Colon polyps   . Coronary artery disease   . Depression   . Headache   . Hyperlipemia   . Hypertension   . Hypothyroid   . Prediabetes   . 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 Tubular adenoma of colon     Assessment: 65 YOM who presented to [facility] with NSTEMI and found via cath to have 3vCAD - transferred to [facility] for CABG evaluation. Pharmacy consulted to resume Heparin post cath.  Heparin level this morning was at goal (0.3), but at low end. No bleeding issues noted. CBC has remained stable overnight.   Goal of Therapy:  Heparin level 0.3-0.7  units/ml Monitor platelets by anticoagulation protocol: Yes   Plan:  - Increase heparin to 1200 units/hr to keep at goal - Daily HL/CBC - Will continue to monitor for any signs/symptoms of bleeding   Thank you for allowing pharmacy to be a part of this patient's care.  Erin Hearing PharmD., BCPS Clinical Pharmacist 10/07/2020 8:05 AM  **Pharmacist phone directory can now be found on [email or web address] ([email or web address]).  Listed under [place].

## 2020-10-07 NOTE — Consult Note (Signed)
Andrew Richardson       [place] [address]             [phone]        [facility] Medical Record #[id number] Date of Birth: [birth year]  Referring: No ref. provider found Primary Care: Derinda Late, MD Primary Cardiologist:No primary care provider on file.  Chief Complaint:   No chief complaint on file.   History of Present Illness:     80 year old male transferred from [facility] for evaluation of three-vessel coronary disease.  He originally presented with an NSTEMI after having several days of anginal symptoms 1 of which woke him up from sleep.  He then underwent a left heart cath which identified the disease.  He does endorse exertional dyspnea, and chest pressure with ambulation occasionally.  He is relatively active prior to developing symptoms in that he works in his yard.   Past Medical and Surgical History: Previous Chest Surgery: No Previous Chest Radiation: No Diabetes Mellitus: No.  HbA1C 5.7 Creatinine: 0.91  Past Medical History:  Diagnosis Date  . Colon polyps   . Coronary artery disease   . Depression   . Headache   . Hyperlipemia   . Hypertension   . Hypothyroid   . Prediabetes   . 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 Tubular adenoma of colon     Past Surgical History:  Procedure Laterality Date  . ANGIOPLASTY    . COLONOSCOPY    . COLONOSCOPY WITH PROPOFOL N/A 05/20/2015   Procedure: COLONOSCOPY WITH PROPOFOL;  Surgeon: Lollie Sails, MD;  Location: [facility] ENDOSCOPY;  Service: Endoscopy;  Laterality: N/A;  . INGUINAL HERNIA REPAIR Left 10/31/2015   Procedure: HERNIA REPAIR INGUINAL ADULT;  Surgeon: Leonie Green, MD;  Location: [facility] ORS;  Service: General;  Laterality: Left;  . LEFT HEART CATH N/A 10/06/2020   Procedure: Left Heart Cath and Coronary Angiography;  Surgeon: Isaias Cowman, MD;  Location: [place] CV LAB;  Service: Cardiovascular;  Laterality: N/A;     Social History: Support: Lives with his wife who is 3 but she has significant joint problems and does not mobilize much.  Social History   Tobacco Use  Smoking Status Former Smoker  . Quit date: 05/19/1989  . Years since quitting: 31.4  Smokeless Tobacco Former Systems developer    Social History   Substance and Sexual Activity  Alcohol Use Yes  . Alcohol/week: 3.0 standard drinks  . Types: 3 Shots of liquor per week     Allergies  Allergen Reactions  . Simvastatin Other (See Comments)    Fatigue and muscle pain/weakness  . Tetracyclines & Related Diarrhea  . Nitrofurantoin Rash     Current Facility-Administered Medications  Medication Dose Route Frequency Provider Last Rate Last Admin  . acetaminophen (TYLENOL) tablet 650 mg  650 mg Oral Q4H PRN Theora Gianotti, NP      . amLODipine (NORVASC) tablet 5 mg  5 mg Oral Daily Theora Gianotti, NP   5 mg at 10/07/20 0905  . aspirin EC tablet 81 mg  81 mg Oral Daily Theora Gianotti, NP   81 mg at 10/07/20 0905  . [START ON 10/08/2020] cefUROXime (ZINACEF) 1.5 g in sodium chloride 0.9 % 100 mL IVPB  1.5 g Intravenous To OR Carmaleta Youngers, Lucile Crater, MD      . Derrill Memo ON 10/08/2020] cefUROXime (ZINACEF) 750 mg in sodium chloride 0.9 % 100  mL IVPB  750 mg Intravenous To OR Ineze Serrao, Lucile Crater, MD      . Derrill Memo ON 10/08/2020] dexmedetomidine (PRECEDEX) 400 MCG/100ML (4 mcg/mL) infusion  0.1-0.7 mcg/kg/hr Intravenous To OR Weslie Rasmus, Lucile Crater, MD      . Derrill Memo ON 10/08/2020] EPINEPHrine (ADRENALIN) 4 mg in NS 250 mL (0.016 mg/mL) premix infusion  0-10 mcg/min Intravenous To OR Kalya Troeger, Lucile Crater, MD      . Derrill Memo ON 10/08/2020] heparin 30,000 units/NS 1000 mL solution for CELLSAVER   Other To OR Sherine Cortese O, MD      . heparin ADULT infusion 100 units/mL (25000 units/211mL)  1,200 Units/hr Intravenous Continuous Lyndee Leo, RPH 12 mL/hr at 10/07/20 0909 1,200 Units/hr at 10/07/20 0909  . [START ON 10/08/2020]  heparin sodium (porcine) 2,500 Units, papaverine 30 mg in electrolyte-148 (PLASMALYTE-148) 500 mL irrigation   Irrigation To OR Tomekia Helton, Lucile Crater, MD      . Derrill Memo ON 10/08/2020] insulin regular, human (MYXREDLIN) 100 units/ 100 mL infusion   Intravenous To OR Lajuana Matte, MD      . Derrill Memo ON 10/08/2020] [place] Blood Cardioplegia vial (lidocaine/magnesium/mannitol 0.26g-4g-6.4g)   Intracoronary To OR Kamee Bobst, Lucile Crater, MD      . levothyroxine (SYNTHROID) tablet 50 mcg  50 mcg Oral Q0600 Theora Gianotti, NP   50 mcg at 10/07/20 [id number]  . losartan (COZAAR) tablet 100 mg  100 mg Oral Daily Theora Gianotti, NP   100 mg at 10/07/20 [id number]  . metoprolol tartrate (LOPRESSOR) tablet 25 mg  25 mg Oral BID Theora Gianotti, NP   25 mg at 10/07/20 [id number]  . [START ON 10/08/2020] milrinone (PRIMACOR) 20 MG/100 ML (0.2 mg/mL) infusion  0.3 mcg/kg/min Intravenous To OR Doha Boling O, MD      . nitroGLYCERIN (NITROGLYN) 2 % ointment 1 inch  1 inch Topical Q6H Theora Gianotti, NP   1 inch at 10/07/20 1424  . nitroGLYCERIN (NITROSTAT) SL tablet 0.4 mg  0.4 mg Sublingual Q5 Min x 3 PRN Theora Gianotti, NP      . Derrill Memo ON 10/08/2020] nitroGLYCERIN 50 mg in dextrose 5 % 250 mL (0.2 mg/mL) infusion  2-200 mcg/min Intravenous To OR Lajuana Matte, MD      . Derrill Memo ON 10/08/2020] norepinephrine (LEVOPHED) 4mg  in 278mL premix infusion  0-40 mcg/min Intravenous To OR Juli Odom, Lucile Crater, MD      . ondansetron (ZOFRAN) injection 4 mg  4 mg Intravenous Q6H PRN Theora Gianotti, NP      . Derrill Memo ON 10/08/2020] phenylephrine (NEOSYNEPHRINE) 20-0.9 MG/250ML-% infusion  30-200 mcg/min Intravenous To OR Lajuana Matte, MD      . Derrill Memo ON 10/08/2020] potassium chloride injection 80 mEq  80 mEq Other To OR Brunette Lavalle, Lucile Crater, MD      . tamsulosin (FLOMAX) capsule 0.4 mg  0.4 mg Oral Daily Theora Gianotti, NP      . Derrill Memo ON 10/08/2020]  tranexamic acid (CYKLOKAPRON) 2,500 mg in sodium chloride 0.9 % 250 mL (10 mg/mL) infusion  1.5 mg/kg/hr Intravenous To OR Lajuana Matte, MD      . Derrill Memo ON 10/08/2020] tranexamic acid (CYKLOKAPRON) bolus via infusion - over 30 minutes 1,308 mg  15 mg/kg Intravenous To OR Peola Joynt, Lucile Crater, MD      . Derrill Memo ON 10/08/2020] tranexamic acid (CYKLOKAPRON) pump prime solution 174 mg  2 mg/kg Intracatheter To OR Alixander Rallis, Lucile Crater, MD      . Derrill Memo  ON 10/08/2020] vancomycin (VANCOREADY) IVPB 1500 mg/300 mL  1,500 mg Intravenous To OR Delayni Streed, Lucile Crater, MD        Medications Prior to Admission  Medication Sig Dispense Refill Last Dose  . amLODipine (NORVASC) 5 MG tablet Take 5 mg by mouth every evening.   10/04/2020 at pm  . atorvastatin (LIPITOR) 10 MG tablet Take 10 mg by mouth every evening.   10/04/2020 at pm  . hydrochlorothiazide (HYDRODIURIL) 25 MG tablet Take 25 mg by mouth daily.   10/04/2020 at am  . levothyroxine (SYNTHROID) 50 MCG tablet Take 50 mcg by mouth daily.   10/04/2020 at am  . losartan (COZAAR) 100 MG tablet Take 100 mg by mouth every evening.   10/04/2020 at pm  . metoprolol tartrate (LOPRESSOR) 25 MG tablet Take 12.5 mg by mouth daily.   10/04/2020 at 9am  . polyvinyl alcohol (LIQUIFILM TEARS) 1.4 % ophthalmic solution Place 1 drop into both eyes daily as needed for dry eyes.   Past Week at Unknown time  . metoprolol (LOPRESSOR) 50 MG tablet Take 50 mg by mouth 2 (two) times daily.  (Patient not taking: No sig reported)   Not Taking at Unknown time  . tamsulosin (FLOMAX) 0.4 MG CAPS capsule Take 0.4 mg by mouth daily. (Patient not taking: No sig reported)   Not Taking at Unknown time    Family History  Problem Relation Age of Onset  . Rheumatic fever Mother      Review of Systems:   Review of Systems  Constitutional: Positive for malaise/fatigue.  Respiratory: Positive for shortness of breath.   Cardiovascular: Positive for chest pain. Negative for leg swelling.   Neurological: Negative.       Physical Exam: BP (!) 152/78 (BP Location: Left Arm)   Pulse 73   Temp 97.6 F (36.4 C) (Oral)   Resp 18   Ht 5\' 8"  (1.727 m)   Wt 87.2 kg   SpO2 97%   BMI 29.22 kg/m  Physical Exam Constitutional:      Appearance: Normal appearance. He is normal weight.  HENT:     Head: Normocephalic and atraumatic.  Eyes:     Extraocular Movements: Extraocular movements intact.  Cardiovascular:     Rate and Rhythm: Normal rate.  Pulmonary:     Effort: Pulmonary effort is normal. No respiratory distress.  Abdominal:     General: Abdomen is flat.  Musculoskeletal:        General: Normal range of motion.     Cervical back: Normal range of motion.  Neurological:     General: No focal deficit present.     Mental Status: He is alert and oriented to person, place, and time.       Diagnostic Studies & Laboratory data:    Left Heart Catherization: Personal review of the left heart cath.  He has good targets in the LAD distribution.  His PDA is also a good target.  The vessels off of the circumflex are somewhat small. Echo: Preserved RV function.  LV function is estimated at 40%.  No significant valvular disease.   I have independently reviewed the above radiologic studies and discussed with the patient   Recent Lab Findings: Lab Results  Component Value Date   WBC 6.4 10/07/2020   HGB 12.8 (L) 10/07/2020   HCT 37.5 (L) 10/07/2020   PLT 170 10/07/2020   GLUCOSE 109 (H) 10/07/2020   CHOL 175 10/06/2020   TRIG 73 10/06/2020   HDL 67  10/06/2020   LDLCALC 93 10/06/2020   NA 133 (L) 10/07/2020   K 3.9 10/07/2020   CL 98 10/07/2020   CREATININE 0.91 10/07/2020   BUN 12 10/07/2020   CO2 27 10/07/2020   INR 1.0 10/05/2020   HGBA1C 5.7 (H) 10/07/2020      Assessment / Plan:   80 year old male presents with an NSTEMI and three-vessel coronary disease.  We reviewed the risks and benefits of surgical revascularization and he is agreeable to proceed.   We also discussed the importance of potentially going to cardiac rehab following the surgery given that his wife who is 10 years old is not ambulatory, and he will require more assistance.     I  spent 40 minutes counseling the patient face to face.   Lajuana Matte 10/07/2020 3:13 PM

## 2020-10-07 NOTE — Progress Notes (Signed)
CARDIAC REHAB PHASE I   Preop education completed with pt. Pt given IS, able to demonstrate 2250. Reviewed importance of IS use, walks, and sternal precautions postop. Pt given cardiac surgery booklet. Pt with concerns, his wife doesn't get around the best, but has friends that can come in intermittently to help. Pt denies further questions or concerns at this time. Will continue to follow throughout his hospital stay.  [phone] Rufina Falco, RN BSN 10/07/2020 2:00 PM

## 2020-10-07 NOTE — Progress Notes (Signed)
Pre CABG has been completed.   Preliminary results in CV Proc.   Abram Sander 10/07/2020 11:49 AM

## 2020-10-07 NOTE — Progress Notes (Signed)
  Echocardiogram 2D Echocardiogram has been performed.  Andrew Richardson 10/07/2020, 8:38 AM

## 2020-10-07 NOTE — Anesthesia Preprocedure Evaluation (Addendum)
Anesthesia Evaluation  Patient identified by MRN, date of birth, ID band Patient awake    Reviewed: Allergy & Precautions, NPO status , Patient's Chart, lab work & pertinent test results, reviewed documented beta blocker date and time   History of Anesthesia Complications Negative for: history of anesthetic complications  Airway Mallampati: I  TM Distance: >3 FB Neck ROM: Full    Dental  (+) Dental Advisory Given   Pulmonary former smoker,  10/05/2020 SARS coronavirus NEG   breath sounds clear to auscultation       Cardiovascular hypertension, Pt. on medications and Pt. on home beta blockers + CAD and + Past MI   Rhythm:Regular Rate:Normal  10/07/2020 ECHO: EF 55%, normal LVF, Grade 1 DD, normal RVF, no significant valvular abnormalities   Neuro/Psych  Headaches, Depression    GI/Hepatic negative GI ROS, Neg liver ROS,   Endo/Other  Hypothyroidism   Renal/GU negative Renal ROS   Bladder cancer    Musculoskeletal   Abdominal   Peds  Hematology negative hematology ROS (+)   Anesthesia Other Findings   Reproductive/Obstetrics                            Anesthesia Physical Anesthesia Plan  ASA: III  Anesthesia Plan: General   Post-op Pain Management:    Induction: Intravenous  PONV Risk Score and Plan: 2 and Treatment may vary due to age or medical condition  Airway Management Planned: Oral ETT  Additional Equipment: Arterial line, CVP, TEE and Ultrasound Guidance Line Placement  Intra-op Plan:   Post-operative Plan: Post-operative intubation/ventilation  Informed Consent: I have reviewed the patients History and Physical, chart, labs and discussed the procedure including the risks, benefits and alternatives for the proposed anesthesia with the patient or authorized representative who has indicated his/her understanding and acceptance.     Dental advisory given  Plan Discussed  with: CRNA and Surgeon  Anesthesia Plan Comments:       Anesthesia Quick Evaluation

## 2020-10-08 ENCOUNTER — Inpatient Hospital Stay (HOSPITAL_COMMUNITY)
Admission: AD | Disposition: A | Discharge status: Home Health | Payer: Self-pay | Attending: Thoracic Surgery (Cardiothoracic Vascular Surgery)

## 2020-10-08 ENCOUNTER — Inpatient Hospital Stay (HOSPITAL_COMMUNITY): Payer: Medicare Other | Admitting: Certified Registered Nurse Anesthetist

## 2020-10-08 ENCOUNTER — Inpatient Hospital Stay (HOSPITAL_COMMUNITY): Payer: Medicare Other

## 2020-10-08 DIAGNOSIS — Z951 Presence of aortocoronary bypass graft: Secondary | ICD-10-CM

## 2020-10-08 HISTORY — PX: TEE WITHOUT CARDIOVERSION: SHX5443

## 2020-10-08 HISTORY — PX: CORONARY ARTERY BYPASS GRAFT: SHX141

## 2020-10-08 LAB — POCT I-STAT 7, (LYTES, BLD GAS, ICA,H+H)
Acid-Base Excess: 3 mmol/L — ABNORMAL HIGH (ref 0.0–2.0)
Acid-Base Excess: 3 mmol/L — ABNORMAL HIGH (ref 0.0–2.0)
Acid-Base Excess: 3 mmol/L — ABNORMAL HIGH (ref 0.0–2.0)
Acid-base deficit: 1 mmol/L (ref 0.0–2.0)
Acid-base deficit: 5 mmol/L — ABNORMAL HIGH (ref 0.0–2.0)
Acid-base deficit: 5 mmol/L — ABNORMAL HIGH (ref 0.0–2.0)
Bicarbonate: 29 mmol/L — ABNORMAL HIGH (ref 20.0–28.0)
Bicarbonate: 27 mmol/L (ref 20.0–28.0)
Bicarbonate: 27 mmol/L (ref 20.0–28.0)
Bicarbonate: 23.5 mmol/L (ref 20.0–28.0)
Bicarbonate: 21.8 mmol/L (ref 20.0–28.0)
Bicarbonate: 22.1 mmol/L (ref 20.0–28.0)
Calcium, Ion: 1.27 mmol/L (ref 1.15–1.40)
Calcium, Ion: 0.98 mmol/L — ABNORMAL LOW (ref 1.15–1.40)
Calcium, Ion: 1.04 mmol/L — ABNORMAL LOW (ref 1.15–1.40)
Calcium, Ion: 1.2 mmol/L (ref 1.15–1.40)
Calcium, Ion: 1.21 mmol/L (ref 1.15–1.40)
Calcium, Ion: 1.2 mmol/L (ref 1.15–1.40)
HCT: 34 % — ABNORMAL LOW (ref 39.0–52.0)
HCT: 24 % — ABNORMAL LOW (ref 39.0–52.0)
HCT: 24 % — ABNORMAL LOW (ref 39.0–52.0)
HCT: 26 % — ABNORMAL LOW (ref 39.0–52.0)
HCT: 25 % — ABNORMAL LOW (ref 39.0–52.0)
HCT: 25 % — ABNORMAL LOW (ref 39.0–52.0)
Hemoglobin: 11.6 g/dL — ABNORMAL LOW (ref 13.0–17.0)
Hemoglobin: 8.2 g/dL — ABNORMAL LOW (ref 13.0–17.0)
Hemoglobin: 8.2 g/dL — ABNORMAL LOW (ref 13.0–17.0)
Hemoglobin: 8.8 g/dL — ABNORMAL LOW (ref 13.0–17.0)
Hemoglobin: 8.5 g/dL — ABNORMAL LOW (ref 13.0–17.0)
Hemoglobin: 8.5 g/dL — ABNORMAL LOW (ref 13.0–17.0)
O2 Saturation: 100 %
O2 Saturation: 100 %
O2 Saturation: 100 %
O2 Saturation: 100 %
O2 Saturation: 97 %
O2 Saturation: 98 %
Patient temperature: 36.6
Patient temperature: 36.8
Patient temperature: 37.3
Potassium: 3.6 mmol/L (ref 3.5–5.1)
Potassium: 3.8 mmol/L (ref 3.5–5.1)
Potassium: 4.3 mmol/L (ref 3.5–5.1)
Potassium: 3.9 mmol/L (ref 3.5–5.1)
Potassium: 4.9 mmol/L (ref 3.5–5.1)
Potassium: 4.8 mmol/L (ref 3.5–5.1)
Sodium: 135 mmol/L (ref 135–145)
Sodium: 136 mmol/L (ref 135–145)
Sodium: 133 mmol/L — ABNORMAL LOW (ref 135–145)
Sodium: 136 mmol/L (ref 135–145)
Sodium: 138 mmol/L (ref 135–145)
Sodium: 136 mmol/L (ref 135–145)
TCO2: 30 mmol/L (ref 22–32)
TCO2: 28 mmol/L (ref 22–32)
TCO2: 28 mmol/L (ref 22–32)
TCO2: 25 mmol/L (ref 22–32)
TCO2: 23 mmol/L (ref 22–32)
TCO2: 24 mmol/L (ref 22–32)
pCO2 arterial: 50.8 mmHg — ABNORMAL HIGH (ref 32.0–48.0)
pCO2 arterial: 39.6 mmHg (ref 32.0–48.0)
pCO2 arterial: 35.2 mmHg (ref 32.0–48.0)
pCO2 arterial: 38.9 mmHg (ref 32.0–48.0)
pCO2 arterial: 46.1 mmHg (ref 32.0–48.0)
pCO2 arterial: 49.5 mmHg — ABNORMAL HIGH (ref 32.0–48.0)
pH, Arterial: 7.364 (ref 7.350–7.450)
pH, Arterial: 7.442 (ref 7.350–7.450)
pH, Arterial: 7.493 — ABNORMAL HIGH (ref 7.350–7.450)
pH, Arterial: 7.387 (ref 7.350–7.450)
pH, Arterial: 7.282 — ABNORMAL LOW (ref 7.350–7.450)
pH, Arterial: 7.259 — ABNORMAL LOW (ref 7.350–7.450)
pO2, Arterial: 475 mmHg — ABNORMAL HIGH (ref 83.0–108.0)
pO2, Arterial: 415 mmHg — ABNORMAL HIGH (ref 83.0–108.0)
pO2, Arterial: 288 mmHg — ABNORMAL HIGH (ref 83.0–108.0)
pO2, Arterial: 215 mmHg — ABNORMAL HIGH (ref 83.0–108.0)
pO2, Arterial: 100 mmHg (ref 83.0–108.0)
pO2, Arterial: 119 mmHg — ABNORMAL HIGH (ref 83.0–108.0)

## 2020-10-08 LAB — POCT I-STAT, CHEM 8
BUN: 16 mg/dL (ref 8–23)
BUN: 15 mg/dL (ref 8–23)
BUN: 14 mg/dL (ref 8–23)
BUN: 15 mg/dL (ref 8–23)
BUN: 14 mg/dL (ref 8–23)
Calcium, Ion: 1.16 mmol/L (ref 1.15–1.40)
Calcium, Ion: 1.25 mmol/L (ref 1.15–1.40)
Calcium, Ion: 1.04 mmol/L — ABNORMAL LOW (ref 1.15–1.40)
Calcium, Ion: 1.05 mmol/L — ABNORMAL LOW (ref 1.15–1.40)
Calcium, Ion: 1.26 mmol/L (ref 1.15–1.40)
Chloride: 98 mmol/L (ref 98–111)
Chloride: 98 mmol/L (ref 98–111)
Chloride: 97 mmol/L — ABNORMAL LOW (ref 98–111)
Chloride: 98 mmol/L (ref 98–111)
Chloride: 101 mmol/L (ref 98–111)
Creatinine, Ser: 0.8 mg/dL (ref 0.61–1.24)
Creatinine, Ser: 0.9 mg/dL (ref 0.61–1.24)
Creatinine, Ser: 0.7 mg/dL (ref 0.61–1.24)
Creatinine, Ser: 0.7 mg/dL (ref 0.61–1.24)
Creatinine, Ser: 0.6 mg/dL — ABNORMAL LOW (ref 0.61–1.24)
Glucose, Bld: 105 mg/dL — ABNORMAL HIGH (ref 70–99)
Glucose, Bld: 105 mg/dL — ABNORMAL HIGH (ref 70–99)
Glucose, Bld: 101 mg/dL — ABNORMAL HIGH (ref 70–99)
Glucose, Bld: 109 mg/dL — ABNORMAL HIGH (ref 70–99)
Glucose, Bld: 108 mg/dL — ABNORMAL HIGH (ref 70–99)
HCT: 37 % — ABNORMAL LOW (ref 39.0–52.0)
HCT: 30 % — ABNORMAL LOW (ref 39.0–52.0)
HCT: 22 % — ABNORMAL LOW (ref 39.0–52.0)
HCT: 26 % — ABNORMAL LOW (ref 39.0–52.0)
HCT: 21 % — ABNORMAL LOW (ref 39.0–52.0)
Hemoglobin: 12.6 g/dL — ABNORMAL LOW (ref 13.0–17.0)
Hemoglobin: 10.2 g/dL — ABNORMAL LOW (ref 13.0–17.0)
Hemoglobin: 7.5 g/dL — ABNORMAL LOW (ref 13.0–17.0)
Hemoglobin: 8.8 g/dL — ABNORMAL LOW (ref 13.0–17.0)
Hemoglobin: 7.1 g/dL — ABNORMAL LOW (ref 13.0–17.0)
Potassium: 3.7 mmol/L (ref 3.5–5.1)
Potassium: 3.7 mmol/L (ref 3.5–5.1)
Potassium: 4.1 mmol/L (ref 3.5–5.1)
Potassium: 4.2 mmol/L (ref 3.5–5.1)
Potassium: 3.7 mmol/L (ref 3.5–5.1)
Sodium: 135 mmol/L (ref 135–145)
Sodium: 136 mmol/L (ref 135–145)
Sodium: 134 mmol/L — ABNORMAL LOW (ref 135–145)
Sodium: 134 mmol/L — ABNORMAL LOW (ref 135–145)
Sodium: 135 mmol/L (ref 135–145)
TCO2: 27 mmol/L (ref 22–32)
TCO2: 27 mmol/L (ref 22–32)
TCO2: 26 mmol/L (ref 22–32)
TCO2: 28 mmol/L (ref 22–32)
TCO2: 28 mmol/L (ref 22–32)

## 2020-10-08 LAB — CBC
HCT: 38.4 % — ABNORMAL LOW (ref 39.0–52.0)
HCT: 27.5 % — ABNORMAL LOW (ref 39.0–52.0)
HCT: 26.6 % — ABNORMAL LOW (ref 39.0–52.0)
Hemoglobin: 13.2 g/dL (ref 13.0–17.0)
Hemoglobin: 9.2 g/dL — ABNORMAL LOW (ref 13.0–17.0)
Hemoglobin: 9.1 g/dL — ABNORMAL LOW (ref 13.0–17.0)
MCH: 34.4 pg — ABNORMAL HIGH (ref 26.0–34.0)
MCH: 34.2 pg — ABNORMAL HIGH (ref 26.0–34.0)
MCH: 35.3 pg — ABNORMAL HIGH (ref 26.0–34.0)
MCHC: 34.4 g/dL (ref 30.0–36.0)
MCHC: 33.5 g/dL (ref 30.0–36.0)
MCHC: 34.2 g/dL (ref 30.0–36.0)
MCV: 100 fL (ref 80.0–100.0)
MCV: 102.2 fL — ABNORMAL HIGH (ref 80.0–100.0)
MCV: 103.1 fL — ABNORMAL HIGH (ref 80.0–100.0)
Platelets: 165 10*3/uL (ref 150–400)
Platelets: 103 10*3/uL — ABNORMAL LOW (ref 150–400)
Platelets: 112 10*3/uL — ABNORMAL LOW (ref 150–400)
RBC: 3.84 MIL/uL — ABNORMAL LOW (ref 4.22–5.81)
RBC: 2.69 MIL/uL — ABNORMAL LOW (ref 4.22–5.81)
RBC: 2.58 MIL/uL — ABNORMAL LOW (ref 4.22–5.81)
RDW: 12.4 % (ref 11.5–15.5)
RDW: 12.4 % (ref 11.5–15.5)
RDW: 12.6 % (ref 11.5–15.5)
WBC: 7.3 10*3/uL (ref 4.0–10.5)
WBC: 8.4 10*3/uL (ref 4.0–10.5)
WBC: 8 10*3/uL (ref 4.0–10.5)
nRBC: 0 % (ref 0.0–0.2)
nRBC: 0 % (ref 0.0–0.2)
nRBC: 0 % (ref 0.0–0.2)

## 2020-10-08 LAB — POCT I-STAT EG7
Acid-Base Excess: 3 mmol/L — ABNORMAL HIGH (ref 0.0–2.0)
Bicarbonate: 27.5 mmol/L (ref 20.0–28.0)
Calcium, Ion: 1.05 mmol/L — ABNORMAL LOW (ref 1.15–1.40)
HCT: 25 % — ABNORMAL LOW (ref 39.0–52.0)
Hemoglobin: 8.5 g/dL — ABNORMAL LOW (ref 13.0–17.0)
O2 Saturation: 75 %
Potassium: 3.7 mmol/L (ref 3.5–5.1)
Sodium: 137 mmol/L (ref 135–145)
TCO2: 29 mmol/L (ref 22–32)
pCO2, Ven: 42.3 mmHg — ABNORMAL LOW (ref 44.0–60.0)
pH, Ven: 7.42 (ref 7.250–7.430)
pO2, Ven: 39 mmHg (ref 32.0–45.0)

## 2020-10-08 LAB — BASIC METABOLIC PANEL
Anion gap: 12 (ref 5–15)
Anion gap: 10 (ref 5–15)
BUN: 14 mg/dL (ref 8–23)
BUN: 14 mg/dL (ref 8–23)
CO2: 24 mmol/L (ref 22–32)
CO2: 20 mmol/L — ABNORMAL LOW (ref 22–32)
Calcium: 9.2 mg/dL (ref 8.9–10.3)
Calcium: 7.7 mg/dL — ABNORMAL LOW (ref 8.9–10.3)
Chloride: 99 mmol/L (ref 98–111)
Chloride: 106 mmol/L (ref 98–111)
Creatinine, Ser: 0.9 mg/dL (ref 0.61–1.24)
Creatinine, Ser: 0.83 mg/dL (ref 0.61–1.24)
GFR, Estimated: >60 mL/min (ref >60)
GFR, Estimated: >60 mL/min (ref >60)
Glucose, Bld: 106 mg/dL — ABNORMAL HIGH (ref 70–99)
Glucose, Bld: 110 mg/dL — ABNORMAL HIGH (ref 70–99)
Potassium: 3.8 mmol/L (ref 3.5–5.1)
Potassium: 4.6 mmol/L (ref 3.5–5.1)
Sodium: 135 mmol/L (ref 135–145)
Sodium: 136 mmol/L (ref 135–145)

## 2020-10-08 LAB — GLUCOSE, CAPILLARY
Glucose-Capillary: 102 mg/dL — ABNORMAL HIGH (ref 70–99)
Glucose-Capillary: 102 mg/dL — ABNORMAL HIGH (ref 70–99)
Glucose-Capillary: 106 mg/dL — ABNORMAL HIGH (ref 70–99)
Glucose-Capillary: 111 mg/dL — ABNORMAL HIGH (ref 70–99)
Glucose-Capillary: 118 mg/dL — ABNORMAL HIGH (ref 70–99)
Glucose-Capillary: 89 mg/dL (ref 70–99)
Glucose-Capillary: 97 mg/dL (ref 70–99)

## 2020-10-08 LAB — PLATELET COUNT: Platelets: 128 10*3/uL — ABNORMAL LOW (ref 150–400)

## 2020-10-08 LAB — PROTIME-INR
INR: 1.4 — ABNORMAL HIGH (ref 0.8–1.2)
Prothrombin Time: 16.6 seconds — ABNORMAL HIGH (ref 11.4–15.2)

## 2020-10-08 LAB — HEMOGLOBIN AND HEMATOCRIT, BLOOD
HCT: 23.8 % — ABNORMAL LOW (ref 39.0–52.0)
Hemoglobin: 8.4 g/dL — ABNORMAL LOW (ref 13.0–17.0)

## 2020-10-08 LAB — ECHO INTRAOPERATIVE TEE
AV Mean grad: 3 mmHg
Height: 68 in
Weight: 3038.4 oz

## 2020-10-08 LAB — SURGICAL PCR SCREEN
MRSA, PCR: NEGATIVE
Staphylococcus aureus: NEGATIVE

## 2020-10-08 LAB — APTT: aPTT: 34 seconds (ref 24–36)

## 2020-10-08 LAB — MAGNESIUM: Magnesium: 2.9 mg/dL — ABNORMAL HIGH (ref 1.7–2.4)

## 2020-10-08 LAB — HEPARIN LEVEL (UNFRACTIONATED): Heparin Unfractionated: 0.39 IU/mL (ref 0.30–0.70)

## 2020-10-08 SURGERY — CORONARY ARTERY BYPASS GRAFTING (CABG)
Anesthesia: General | Site: Chest

## 2020-10-08 MED ORDER — ASPIRIN 81 MG PO CHEW: 324.0000 mg | CHEWABLE_TABLET | Freq: Every day | ORAL | Status: DC

## 2020-10-08 MED ORDER — ORAL CARE MOUTH RINSE
15.0000 mL | OROMUCOSAL | Status: DC
  Administered 2020-10-08 (×2): 15 mL via OROMUCOSAL

## 2020-10-08 MED ORDER — ONDANSETRON HCL 4 MG/2ML IJ SOLN
4.0000 mg | Freq: Four times a day (QID) | INTRAMUSCULAR | Status: DC | PRN
  Administered 2020-10-10: 4 mg via INTRAVENOUS
  Filled 2020-10-08: qty 2

## 2020-10-08 MED ORDER — BISACODYL 5 MG PO TBEC
10.0000 mg | DELAYED_RELEASE_TABLET | Freq: Every day | ORAL | Status: DC
  Administered 2020-10-09 – 2020-10-15 (×6): 10 mg via ORAL
  Filled 2020-10-08 (×6): qty 2

## 2020-10-08 MED ORDER — OXYCODONE HCL 5 MG PO TABS
5.0000 mg | ORAL_TABLET | ORAL | Status: DC | PRN
  Administered 2020-10-09 – 2020-10-11 (×2): 5 mg via ORAL
  Administered 2020-10-12 – 2020-10-13 (×2): 10 mg via ORAL
  Filled 2020-10-08: qty 2
  Filled 2020-10-08: qty 1
  Filled 2020-10-08: qty 2
  Filled 2020-10-08: qty 1

## 2020-10-08 MED ORDER — POTASSIUM CHLORIDE 10 MEQ/50ML IV SOLN
10.0000 meq | INTRAVENOUS | Status: AC
  Administered 2020-10-08 (×3): 10 meq via INTRAVENOUS

## 2020-10-08 MED ORDER — MAGNESIUM SULFATE 4 GM/100ML IV SOLN
4.0000 g | Freq: Once | INTRAVENOUS | Status: AC
  Administered 2020-10-08: 4 g via INTRAVENOUS
  Filled 2020-10-08: qty 100

## 2020-10-08 MED ORDER — PHENYLEPHRINE 40 MCG/ML (10ML) SYRINGE FOR IV PUSH (FOR BLOOD PRESSURE SUPPORT)
PREFILLED_SYRINGE | INTRAVENOUS | Status: AC
  Filled 2020-10-08: qty 10

## 2020-10-08 MED ORDER — FAMOTIDINE IN NACL 20-0.9 MG/50ML-% IV SOLN
20.0000 mg | Freq: Two times a day (BID) | INTRAVENOUS | Status: AC
  Administered 2020-10-08 (×2): 20 mg via INTRAVENOUS
  Filled 2020-10-08 (×2): qty 50

## 2020-10-08 MED ORDER — METOPROLOL TARTRATE 12.5 MG HALF TABLET
12.5000 mg | ORAL_TABLET | Freq: Two times a day (BID) | ORAL | Status: DC
  Administered 2020-10-08 – 2020-10-12 (×9): 12.5 mg via ORAL
  Filled 2020-10-08 (×9): qty 1

## 2020-10-08 MED ORDER — SODIUM CHLORIDE 0.9 % IV SOLN: INTRAVENOUS | Status: DC | PRN

## 2020-10-08 MED ORDER — ACETAMINOPHEN 650 MG RE SUPP
650.0000 mg | Freq: Once | RECTAL | Status: AC
  Administered 2020-10-08: 650 mg via RECTAL

## 2020-10-08 MED ORDER — METOPROLOL TARTRATE 5 MG/5ML IV SOLN
2.5000 mg | INTRAVENOUS | Status: DC | PRN
Start: 2020-10-08 — End: 2020-10-15

## 2020-10-08 MED ORDER — PHENYLEPHRINE HCL-NACL 20-0.9 MG/250ML-% IV SOLN
INTRAVENOUS | Status: DC | PRN
  Administered 2020-10-08: 50 ug/min via INTRAVENOUS

## 2020-10-08 MED ORDER — ARTIFICIAL TEARS OPHTHALMIC OINT
TOPICAL_OINTMENT | OPHTHALMIC | Status: AC
  Filled 2020-10-08: qty 3.5

## 2020-10-08 MED ORDER — MIDAZOLAM HCL 2 MG/2ML IJ SOLN: 2.0000 mg | INTRAMUSCULAR | Status: DC | PRN

## 2020-10-08 MED ORDER — MIDAZOLAM HCL (PF) 10 MG/2ML IJ SOLN
INTRAMUSCULAR | Status: AC
  Filled 2020-10-08: qty 2

## 2020-10-08 MED ORDER — SODIUM CHLORIDE 0.9% FLUSH
10.0000 mL | Freq: Two times a day (BID) | INTRAVENOUS | Status: DC
  Administered 2020-10-08 – 2020-10-14 (×7): 10 mL

## 2020-10-08 MED ORDER — FENTANYL CITRATE (PF) 250 MCG/5ML IJ SOLN
INTRAMUSCULAR | Status: AC
  Filled 2020-10-08: qty 25

## 2020-10-08 MED ORDER — TRAMADOL HCL 50 MG PO TABS
50.0000 mg | ORAL_TABLET | ORAL | Status: DC | PRN
  Administered 2020-10-08 – 2020-10-14 (×5): 100 mg via ORAL
  Filled 2020-10-08 (×5): qty 2
  Filled 2020-10-08: qty 1

## 2020-10-08 MED ORDER — PHENYLEPHRINE 40 MCG/ML (10ML) SYRINGE FOR IV PUSH (FOR BLOOD PRESSURE SUPPORT)
PREFILLED_SYRINGE | INTRAVENOUS | Status: DC | PRN
  Administered 2020-10-08: 120 ug via INTRAVENOUS
  Administered 2020-10-08: 80 ug via INTRAVENOUS
  Administered 2020-10-08: 40 ug via INTRAVENOUS
  Administered 2020-10-08: 160 ug via INTRAVENOUS
  Administered 2020-10-08: 80 ug via INTRAVENOUS
  Administered 2020-10-08: 120 ug via INTRAVENOUS
  Administered 2020-10-08: 40 ug via INTRAVENOUS
  Administered 2020-10-08: 120 ug via INTRAVENOUS

## 2020-10-08 MED ORDER — DOBUTAMINE IN D5W 4-5 MG/ML-% IV SOLN
2.5000 ug/kg/min | INTRAVENOUS | Status: DC
  Administered 2020-10-08: 2.5 ug/kg/min via INTRAVENOUS
  Filled 2020-10-08: qty 250

## 2020-10-08 MED ORDER — LACTATED RINGERS IV SOLN: INTRAVENOUS | Status: DC

## 2020-10-08 MED ORDER — INSULIN ASPART 100 UNIT/ML ~~LOC~~ SOLN
0.0000 [IU] | SUBCUTANEOUS | Status: DC
  Administered 2020-10-09 (×2): 4 [IU] via SUBCUTANEOUS
  Administered 2020-10-09: 2 [IU] via SUBCUTANEOUS

## 2020-10-08 MED ORDER — METOPROLOL TARTRATE 25 MG/10 ML ORAL SUSPENSION
12.5000 mg | Freq: Two times a day (BID) | ORAL | Status: DC
  Filled 2020-10-08 (×3): qty 5

## 2020-10-08 MED ORDER — HEPARIN SODIUM (PORCINE) 1000 UNIT/ML IJ SOLN
INTRAMUSCULAR | Status: DC | PRN
  Administered 2020-10-08: 5000 [IU] via INTRAVENOUS
  Administered 2020-10-08: 28000 [IU] via INTRAVENOUS

## 2020-10-08 MED ORDER — SODIUM CHLORIDE 0.9 % IV SOLN
1.5000 g | Freq: Two times a day (BID) | INTRAVENOUS | Status: AC
  Administered 2020-10-08 – 2020-10-10 (×4): 1.5 g via INTRAVENOUS
  Filled 2020-10-08 (×4): qty 1.5

## 2020-10-08 MED ORDER — ROCURONIUM BROMIDE 10 MG/ML (PF) SYRINGE
PREFILLED_SYRINGE | INTRAVENOUS | Status: AC
  Filled 2020-10-08: qty 20

## 2020-10-08 MED ORDER — SODIUM CHLORIDE 0.9% FLUSH
3.0000 mL | INTRAVENOUS | Status: DC | PRN
  Administered 2020-10-11: 3 mL via INTRAVENOUS

## 2020-10-08 MED ORDER — ALBUMIN HUMAN 5 % IV SOLN: INTRAVENOUS | Status: DC | PRN

## 2020-10-08 MED ORDER — PANTOPRAZOLE SODIUM 40 MG PO TBEC
40.0000 mg | DELAYED_RELEASE_TABLET | Freq: Every day | ORAL | Status: DC
  Administered 2020-10-10 – 2020-10-15 (×6): 40 mg via ORAL
  Filled 2020-10-08 (×6): qty 1

## 2020-10-08 MED ORDER — SODIUM CHLORIDE 0.9 % IV SOLN: INTRAVENOUS | Status: DC

## 2020-10-08 MED ORDER — ROCURONIUM BROMIDE 10 MG/ML (PF) SYRINGE
PREFILLED_SYRINGE | INTRAVENOUS | Status: DC | PRN
  Administered 2020-10-08: 80 mg via INTRAVENOUS
  Administered 2020-10-08: 20 mg via INTRAVENOUS
  Administered 2020-10-08 (×2): 50 mg via INTRAVENOUS

## 2020-10-08 MED ORDER — ACETAMINOPHEN 500 MG PO TABS
1000.0000 mg | ORAL_TABLET | Freq: Four times a day (QID) | ORAL | Status: AC
  Administered 2020-10-08 – 2020-10-13 (×17): 1000 mg via ORAL
  Filled 2020-10-08 (×17): qty 2

## 2020-10-08 MED ORDER — CHLORHEXIDINE GLUCONATE CLOTH 2 % EX PADS
6.0000 | MEDICATED_PAD | Freq: Every day | CUTANEOUS | Status: DC
  Administered 2020-10-08 – 2020-10-12 (×5): 6 via TOPICAL

## 2020-10-08 MED ORDER — VANCOMYCIN HCL IN DEXTROSE 1-5 GM/200ML-% IV SOLN
1000.0000 mg | Freq: Once | INTRAVENOUS | Status: AC
  Administered 2020-10-08: 1000 mg via INTRAVENOUS
  Filled 2020-10-08: qty 200

## 2020-10-08 MED ORDER — ORAL CARE MOUTH RINSE
15.0000 mL | Freq: Two times a day (BID) | OROMUCOSAL | Status: DC
  Administered 2020-10-08 – 2020-10-15 (×11): 15 mL via OROMUCOSAL

## 2020-10-08 MED ORDER — DOCUSATE SODIUM 100 MG PO CAPS
200.0000 mg | ORAL_CAPSULE | Freq: Every day | ORAL | Status: DC
  Administered 2020-10-09 – 2020-10-15 (×6): 200 mg via ORAL
  Filled 2020-10-08 (×6): qty 2

## 2020-10-08 MED ORDER — LACTATED RINGERS IV SOLN
500.0000 mL | Freq: Once | INTRAVENOUS | Status: AC | PRN
  Administered 2020-10-08: 500 mL via INTRAVENOUS

## 2020-10-08 MED ORDER — LACTATED RINGERS IV SOLN: INTRAVENOUS | Status: DC | PRN

## 2020-10-08 MED ORDER — ACETAMINOPHEN 160 MG/5ML PO SOLN: 650.0000 mg | Freq: Once | ORAL | Status: AC

## 2020-10-08 MED ORDER — NICARDIPINE HCL IN NACL 20-0.86 MG/200ML-% IV SOLN
5.0000 mg/h | INTRAVENOUS | Status: DC
  Filled 2020-10-08 (×3): qty 200

## 2020-10-08 MED ORDER — NOREPINEPHRINE 4 MG/250ML-% IV SOLN: 0.0000 ug/min | INTRAVENOUS | Status: DC

## 2020-10-08 MED ORDER — MUPIROCIN 2 % EX OINT: 1.0000 "application " | TOPICAL_OINTMENT | Freq: Two times a day (BID) | CUTANEOUS | Status: DC

## 2020-10-08 MED ORDER — 0.9 % SODIUM CHLORIDE (POUR BTL) OPTIME
TOPICAL | Status: DC | PRN
  Administered 2020-10-08: 5000 mL

## 2020-10-08 MED ORDER — DEXMEDETOMIDINE HCL IN NACL 400 MCG/100ML IV SOLN
INTRAVENOUS | Status: DC | PRN
  Administered 2020-10-08: .5 ug/kg/h via INTRAVENOUS

## 2020-10-08 MED ORDER — FENTANYL CITRATE (PF) 250 MCG/5ML IJ SOLN
INTRAMUSCULAR | Status: DC | PRN
  Administered 2020-10-08: 100 ug via INTRAVENOUS
  Administered 2020-10-08: 200 ug via INTRAVENOUS
  Administered 2020-10-08: 50 ug via INTRAVENOUS
  Administered 2020-10-08: 250 ug via INTRAVENOUS
  Administered 2020-10-08: 100 ug via INTRAVENOUS
  Administered 2020-10-08 (×2): 150 ug via INTRAVENOUS

## 2020-10-08 MED ORDER — MIDAZOLAM HCL (PF) 5 MG/ML IJ SOLN
INTRAMUSCULAR | Status: DC | PRN
  Administered 2020-10-08: 1 mg via INTRAVENOUS
  Administered 2020-10-08 (×2): 2 mg via INTRAVENOUS
  Administered 2020-10-08: 1 mg via INTRAVENOUS

## 2020-10-08 MED ORDER — SODIUM CHLORIDE (PF) 0.9 % IJ SOLN
INTRAMUSCULAR | Status: AC
  Filled 2020-10-08: qty 10

## 2020-10-08 MED ORDER — SODIUM CHLORIDE 0.9 % IV SOLN: 250.0000 mL | INTRAVENOUS | Status: DC

## 2020-10-08 MED ORDER — BUPIVACAINE LIPOSOME 1.3 % IJ SUSP
20.0000 mL | INTRAMUSCULAR | Status: AC
  Filled 2020-10-08 (×2): qty 20

## 2020-10-08 MED ORDER — SODIUM CHLORIDE 0.9% FLUSH: 10.0000 mL | INTRAVENOUS | Status: DC | PRN

## 2020-10-08 MED ORDER — SODIUM CHLORIDE 0.9% FLUSH
3.0000 mL | Freq: Two times a day (BID) | INTRAVENOUS | Status: DC
  Administered 2020-10-09 – 2020-10-15 (×6): 3 mL via INTRAVENOUS

## 2020-10-08 MED ORDER — ACETAMINOPHEN 160 MG/5ML PO SOLN: 1000.0000 mg | Freq: Four times a day (QID) | ORAL | Status: DC

## 2020-10-08 MED ORDER — PROPOFOL 10 MG/ML IV BOLUS
INTRAVENOUS | Status: AC
  Filled 2020-10-08: qty 20

## 2020-10-08 MED ORDER — ALBUMIN HUMAN 5 % IV SOLN
250.0000 mL | INTRAVENOUS | Status: AC | PRN
  Administered 2020-10-08 (×4): 12.5 g via INTRAVENOUS
  Filled 2020-10-08 (×2): qty 250

## 2020-10-08 MED ORDER — LEVOTHYROXINE SODIUM 50 MCG PO TABS
50.0000 ug | ORAL_TABLET | Freq: Every day | ORAL | Status: DC
  Administered 2020-10-09 – 2020-10-15 (×7): 50 ug via ORAL
  Filled 2020-10-08 (×7): qty 1

## 2020-10-08 MED ORDER — MORPHINE SULFATE (PF) 2 MG/ML IV SOLN
1.0000 mg | INTRAVENOUS | Status: DC | PRN
  Administered 2020-10-08 (×2): 2 mg via INTRAVENOUS
  Filled 2020-10-08 (×2): qty 1

## 2020-10-08 MED ORDER — CHLORHEXIDINE GLUCONATE 0.12 % MT SOLN
15.0000 mL | OROMUCOSAL | Status: AC
  Administered 2020-10-08: 15 mL via OROMUCOSAL

## 2020-10-08 MED ORDER — CHLORHEXIDINE GLUCONATE 0.12% ORAL RINSE (MEDLINE KIT): 15.0000 mL | Freq: Two times a day (BID) | OROMUCOSAL | Status: DC

## 2020-10-08 MED ORDER — SODIUM BICARBONATE 8.4 % IV SOLN
50.0000 meq | Freq: Once | INTRAVENOUS | Status: AC
  Administered 2020-10-08: 50 meq via INTRAVENOUS

## 2020-10-08 MED ORDER — ARTIFICIAL TEARS OPHTHALMIC OINT
TOPICAL_OINTMENT | OPHTHALMIC | Status: DC | PRN
  Administered 2020-10-08: 1 via OPHTHALMIC

## 2020-10-08 MED ORDER — BISACODYL 10 MG RE SUPP
10.0000 mg | Freq: Every day | RECTAL | Status: DC
  Filled 2020-10-08: qty 1

## 2020-10-08 MED ORDER — INSULIN REGULAR(HUMAN) IN NACL 100-0.9 UT/100ML-% IV SOLN: INTRAVENOUS | Status: DC

## 2020-10-08 MED ORDER — ONDANSETRON HCL 4 MG/2ML IJ SOLN
INTRAMUSCULAR | Status: AC
  Filled 2020-10-08: qty 2

## 2020-10-08 MED ORDER — SODIUM CHLORIDE 0.45 % IV SOLN: INTRAVENOUS | Status: DC | PRN

## 2020-10-08 MED ORDER — DEXTROSE 50 % IV SOLN: 0.0000 mL | INTRAVENOUS | Status: DC | PRN

## 2020-10-08 MED ORDER — ATORVASTATIN CALCIUM 10 MG PO TABS
10.0000 mg | ORAL_TABLET | Freq: Every evening | ORAL | Status: DC
  Administered 2020-10-09 – 2020-10-14 (×6): 10 mg via ORAL
  Filled 2020-10-08 (×6): qty 1

## 2020-10-08 MED ORDER — PHENYLEPHRINE HCL-NACL 20-0.9 MG/250ML-% IV SOLN: 0.0000 ug/min | INTRAVENOUS | Status: DC

## 2020-10-08 MED ORDER — HEMOSTATIC AGENTS (NO CHARGE) OPTIME
TOPICAL | Status: DC | PRN
  Administered 2020-10-08 (×2): 1 via TOPICAL

## 2020-10-08 MED ORDER — DEXMEDETOMIDINE HCL IN NACL 400 MCG/100ML IV SOLN
0.0000 ug/kg/h | INTRAVENOUS | Status: DC
  Administered 2020-10-08: 0.3 ug/kg/h via INTRAVENOUS
  Filled 2020-10-08: qty 100

## 2020-10-08 MED ORDER — PROTAMINE SULFATE 10 MG/ML IV SOLN
INTRAVENOUS | Status: DC | PRN
  Administered 2020-10-08: 260 mg via INTRAVENOUS
  Administered 2020-10-08: 20 mg via INTRAVENOUS

## 2020-10-08 MED ORDER — PROPOFOL 10 MG/ML IV BOLUS
INTRAVENOUS | Status: DC | PRN
  Administered 2020-10-08: 50 mg via INTRAVENOUS

## 2020-10-08 MED ORDER — PLASMA-LYTE 148 IV SOLN
INTRAVENOUS | Status: DC | PRN
  Administered 2020-10-08: 500 mL via INTRAVASCULAR

## 2020-10-08 MED ORDER — ASPIRIN EC 325 MG PO TBEC
325.0000 mg | DELAYED_RELEASE_TABLET | Freq: Every day | ORAL | Status: DC
  Administered 2020-10-09 – 2020-10-15 (×7): 325 mg via ORAL
  Filled 2020-10-08 (×7): qty 1

## 2020-10-08 SURGICAL SUPPLY — 91 items
BAG DECANTER FOR FLEXI CONT (MISCELLANEOUS) ×3 IMPLANT
BLADE CLIPPER SURG (BLADE) IMPLANT
BLADE STERNUM SYSTEM 6 (BLADE) ×3 IMPLANT
BNDG ELASTIC 4X5.8 VLCR STR LF (GAUZE/BANDAGES/DRESSINGS) ×6 IMPLANT
BNDG ELASTIC 6X5.8 VLCR STR LF (GAUZE/BANDAGES/DRESSINGS) ×6 IMPLANT
BNDG GAUZE ELAST 4 BULKY (GAUZE/BANDAGES/DRESSINGS) ×6 IMPLANT
CABLE SURGICAL S-101-97-12 (CABLE) ×3 IMPLANT
CANISTER SUCT 3000ML PPV (MISCELLANEOUS) ×3 IMPLANT
CANNULA MC2 2 STG 29/37 NON-V (CANNULA) ×2 IMPLANT
CANNULA MC2 TWO STAGE (CANNULA) ×1
CANNULA NON VENT 20FR 12 (CANNULA) ×3 IMPLANT
CATH ROBINSON RED A/P 18FR (CATHETERS) ×6 IMPLANT
CLIP RETRACTION 3.0MM CORONARY (MISCELLANEOUS) ×3 IMPLANT
CLIP VESOCCLUDE MED 24/CT (CLIP) ×3 IMPLANT
CLIP VESOCCLUDE SM WIDE 24/CT (CLIP) IMPLANT
CONN ST 1/2X1/2  BEN (MISCELLANEOUS) ×1
CONN ST 1/2X1/2 BEN (MISCELLANEOUS) ×2 IMPLANT
CONNECTOR BLAKE 2:1 CARIO BLK (MISCELLANEOUS) ×3 IMPLANT
DERMABOND ADVANCED (GAUZE/BANDAGES/DRESSINGS) ×2
DERMABOND ADVANCED .7 DNX12 (GAUZE/BANDAGES/DRESSINGS) ×4 IMPLANT
DRAIN CHANNEL 10M FLAT 3/4 FLT (DRAIN) ×3 IMPLANT
DRAIN CHANNEL 19F RND (DRAIN) ×9 IMPLANT
DRAIN CONNECTOR BLAKE 1:1 (MISCELLANEOUS) ×6 IMPLANT
DRAPE CARDIOVASCULAR INCISE (DRAPES) ×1
DRAPE INCISE IOBAN 66X45 STRL (DRAPES) IMPLANT
DRAPE SLUSH/WARMER DISC (DRAPES) ×3 IMPLANT
DRAPE SRG 135X102X78XABS (DRAPES) ×2 IMPLANT
DRSG AQUACEL AG ADV 3.5X10 (GAUZE/BANDAGES/DRESSINGS) ×3 IMPLANT
DRSG COVADERM 4X14 (GAUZE/BANDAGES/DRESSINGS) ×3 IMPLANT
DRSG COVADERM 4X8 (GAUZE/BANDAGES/DRESSINGS) ×3 IMPLANT
ELECT BLADE 4.0 EZ CLEAN MEGAD (MISCELLANEOUS) ×3
ELECT REM PT RETURN 9FT ADLT (ELECTROSURGICAL) ×6
ELECTRODE BLDE 4.0 EZ CLN MEGD (MISCELLANEOUS) ×2 IMPLANT
ELECTRODE REM PT RTRN 9FT ADLT (ELECTROSURGICAL) ×4 IMPLANT
FELT TEFLON 1X6 (MISCELLANEOUS) ×6 IMPLANT
GAUZE SPONGE 4X4 12PLY STRL (GAUZE/BANDAGES/DRESSINGS) ×6 IMPLANT
GAUZE SPONGE 4X4 12PLY STRL LF (GAUZE/BANDAGES/DRESSINGS) ×9 IMPLANT
GLOVE BIO SURGEON STRL SZ7 (GLOVE) ×6 IMPLANT
GLOVE BIOGEL M STRL SZ7.5 (GLOVE) ×6 IMPLANT
GOWN STRL REUS W/ TWL LRG LVL3 (GOWN DISPOSABLE) ×8 IMPLANT
GOWN STRL REUS W/ TWL XL LVL3 (GOWN DISPOSABLE) ×4 IMPLANT
GOWN STRL REUS W/TWL LRG LVL3 (GOWN DISPOSABLE) ×4
GOWN STRL REUS W/TWL XL LVL3 (GOWN DISPOSABLE) ×2
HEMOSTAT POWDER SURGIFOAM 1G (HEMOSTASIS) ×6 IMPLANT
INSERT SUTURE HOLDER (MISCELLANEOUS) ×3 IMPLANT
KIT BASIN OR (CUSTOM PROCEDURE TRAY) ×3 IMPLANT
KIT DRAINAGE VACCUM ASSIST (KITS) ×3 IMPLANT
KIT SUCTION CATH 14FR (SUCTIONS) ×3 IMPLANT
KIT TURNOVER KIT B (KITS) ×3 IMPLANT
KIT VASOVIEW HEMOPRO 2 VH 4000 (KITS) ×6 IMPLANT
LEAD PACING MYOCARDI (MISCELLANEOUS) ×6 IMPLANT
LINE EXTENSION DELIVERY (MISCELLANEOUS) ×3 IMPLANT
MARKER GRAFT CORONARY BYPASS (MISCELLANEOUS) ×12 IMPLANT
NS IRRIG 1000ML POUR BTL (IV SOLUTION) ×15 IMPLANT
OFFPUMP STABILIZER SUV (MISCELLANEOUS) ×3 IMPLANT
PACK ACCESSORY CANNULA KIT (KITS) ×3 IMPLANT
PACK E OPEN HEART (SUTURE) ×3 IMPLANT
PACK OPEN HEART (CUSTOM PROCEDURE TRAY) ×3 IMPLANT
PAD ARMBOARD 7.5X6 YLW CONV (MISCELLANEOUS) ×6 IMPLANT
PAD ELECT DEFIB RADIOL ZOLL (MISCELLANEOUS) ×3 IMPLANT
PENCIL BUTTON HOLSTER BLD 10FT (ELECTRODE) ×3 IMPLANT
POSITIONER HEAD DONUT 9IN (MISCELLANEOUS) ×3 IMPLANT
PUNCH AORTIC ROTATE 4.0MM (MISCELLANEOUS) ×3 IMPLANT
SET CARDIOPLEGIA MPS 5001102 (MISCELLANEOUS) ×3 IMPLANT
SPONGE LAP 18X18 X RAY DECT (DISPOSABLE) ×3 IMPLANT
STAPLER VISISTAT 35W (STAPLE) ×3 IMPLANT
SUPPORT HEART JANKE-BARRON (MISCELLANEOUS) ×3 IMPLANT
SUT BONE WAX W31G (SUTURE) ×3 IMPLANT
SUT ETHIBOND X763 2 0 SH 1 (SUTURE) ×6 IMPLANT
SUT MNCRL AB 3-0 PS2 18 (SUTURE) ×9 IMPLANT
SUT MNCRL AB 4-0 PS2 18 (SUTURE) ×6 IMPLANT
SUT PDS AB 1 CTX 36 (SUTURE) ×6 IMPLANT
SUT PROLENE 4 0 RB 1 (SUTURE) ×1
SUT PROLENE 4 0 SH DA (SUTURE) ×3 IMPLANT
SUT PROLENE 4-0 RB1 .5 CRCL 36 (SUTURE) ×2 IMPLANT
SUT PROLENE 5 0 C 1 36 (SUTURE) ×9 IMPLANT
SUT PROLENE 7 0 BV1 MDA (SUTURE) ×6 IMPLANT
SUT STEEL 6MS V (SUTURE) IMPLANT
SUT STEEL STERNAL CCS#1 18IN (SUTURE) ×9 IMPLANT
SUT VIC AB 2-0 CT1 27 (SUTURE) ×2
SUT VIC AB 2-0 CT1 TAPERPNT 27 (SUTURE) ×4 IMPLANT
SYR BULB IRRIG 60ML STRL (SYRINGE) ×3 IMPLANT
SYSTEM SAHARA CHEST DRAIN ATS (WOUND CARE) ×3 IMPLANT
TAPE PAPER 2X10 WHT MICROPORE (GAUZE/BANDAGES/DRESSINGS) ×3 IMPLANT
TAPES RETRACTO (MISCELLANEOUS) ×6 IMPLANT
TOWEL GREEN STERILE (TOWEL DISPOSABLE) ×3 IMPLANT
TOWEL GREEN STERILE FF (TOWEL DISPOSABLE) ×3 IMPLANT
TRAY FOLEY SLVR 16FR TEMP STAT (SET/KITS/TRAYS/PACK) ×3 IMPLANT
TUBING LAP HI FLOW INSUFFLATIO (TUBING) ×3 IMPLANT
UNDERPAD 30X36 HEAVY ABSORB (UNDERPADS AND DIAPERS) ×3 IMPLANT
WATER STERILE IRR 1000ML POUR (IV SOLUTION) ×6 IMPLANT

## 2020-10-08 NOTE — Anesthesia Procedure Notes (Signed)
Arterial Line Insertion Start/End1/08/2021 7:54 AM, 10/08/2020 8:04 AM Performed by: Dorthea Cove, CRNA, CRNA  Patient location: Pre-op. Preanesthetic checklist: patient identified, IV checked, site marked, risks and benefits discussed, surgical consent, monitors and equipment checked, pre-op evaluation, timeout performed and anesthesia consent Lidocaine 1% used for infiltration Left, radial was placed Catheter size: 20 Fr Hand hygiene performed  and maximum sterile barriers used   Attempts: 1 Procedure performed without using ultrasound guided technique. Following insertion, dressing applied and Biopatch. Post procedure assessment: normal and unchanged  Patient tolerated the procedure well with no immediate complications.

## 2020-10-08 NOTE — Anesthesia Postprocedure Evaluation (Signed)
Anesthesia Post Note  Patient: Charna Busman  Procedure(s) Performed: CORONARY ARTERY BYPASS GRAFTING TIMES  FOUR  WITH LEFT INTERNAL MAMMARY ARTERY AND ENDOSCOPIC HARVEST OF BILATERAL  LEG SAPHENOUS VEIN (N/A Chest) TRANSESOPHAGEAL ECHOCARDIOGRAM (TEE) (N/A )     Patient location during evaluation: SICU Anesthesia Type: General Level of consciousness: sedated and patient remains intubated per anesthesia plan Pain management: pain level controlled Vital Signs Assessment: post-procedure vital signs reviewed and stable Respiratory status: patient on ventilator - see flowsheet for VS and patient remains intubated per anesthesia plan (beginning to wean from vent) Cardiovascular status: stable Postop Assessment: no apparent nausea or vomiting Anesthetic complications: no   No complications documented.  Last Vitals:  Vitals:   10/08/20 1800 10/08/20 1815  BP: 100/72   Pulse: 61 64  Resp: 14 11  Temp: (!) 36.3 C (!) 36.4 C  SpO2: 100% 100%    Last Pain:  Vitals:   10/08/20 1430  TempSrc: Bladder  PainSc:                  Elizeth Weinrich,E. Gloriana Piltz

## 2020-10-08 NOTE — Transfer of Care (Signed)
Immediate Anesthesia Transfer of Care Note  Patient: Andrew Richardson  Procedure(s) Performed: CORONARY ARTERY BYPASS GRAFTING TIMES  FOUR  WITH LEFT INTERNAL MAMMARY ARTERY AND ENDOSCOPIC HARVEST OF BILATERAL  LEG SAPHENOUS VEIN (N/A Chest) TRANSESOPHAGEAL ECHOCARDIOGRAM (TEE) (N/A )  Patient Location: ICU  Anesthesia Type:General  Level of Consciousness: sedated and Patient remains intubated per anesthesia plan  Airway & Oxygen Therapy: Patient remains intubated per anesthesia plan and Patient placed on Ventilator (see vital sign flow sheet for setting)  Post-op Assessment: Report given to RN and Post -op Vital signs reviewed and stable  Post vital signs: Reviewed and stable  Last Vitals:  Vitals Value Taken Time  BP 114/65   Temp    Pulse 80 10/08/20 1428  Resp 12 10/08/20 1428  SpO2 100 % 10/08/20 1428  Vitals shown include unvalidated device data.  Last Pain:  Vitals:   10/08/20 0444  TempSrc: Oral  PainSc:          Complications: No complications documented.

## 2020-10-08 NOTE — Anesthesia Procedure Notes (Signed)
Procedure Name: Intubation Date/Time: 10/08/2020 9:01 AM Performed by: Dorthea Cove, CRNA Pre-anesthesia Checklist: Patient identified, Emergency Drugs available, Suction available and Patient being monitored Patient Re-evaluated:Patient Re-evaluated prior to induction Oxygen Delivery Method: Circle system utilized Preoxygenation: Pre-oxygenation with 100% oxygen Induction Type: IV induction Ventilation: Mask ventilation without difficulty Laryngoscope Size: Mac and 4 Grade View: Grade I Tube type: Oral Tube size: 8.0 mm Number of attempts: 1 Airway Equipment and Method: Stylet and Oral airway Placement Confirmation: ETT inserted through vocal cords under direct vision,  positive ETCO2 and breath sounds checked- equal and bilateral Secured at: 23 cm Tube secured with: Tape Dental Injury: Teeth and Oropharynx as per pre-operative assessment

## 2020-10-08 NOTE — Op Note (Signed)
[address]       [place] [address]             [phone]                                          10/08/2020 Patient:  Andrew Richardson Pre-Op Dx:  NSTEMI   3V CAD   hyperlipidemia   Post-op Dx:  Same Procedure: CABG X 4.  LIMA LAD, RSVG PDA, OM1, and D1   Endoscopic greater saphenous vein harvest on the right and left   Surgeon and Role:      * Greco Gastelum, Lucile Crater, MD - Primary    * T. Harriet Pho, PA-C - assisting Assistant: Josie Saunders, PA-C  Anesthesia  general EBL:  537ml Blood Administration: none Xclamp Time:  48 min Pump Time:  173min  Drains: 58 F blake drain:  R, L, mediastinal  Wires: ventricular Counts: correct   Indications: 80 year old male presents with an NSTEMI and three-vessel coronary disease.  We reviewed the risks and benefits of surgical revascularization and he is agreeable to proceed.  We also discussed the importance of potentially going to cardiac rehab following the surgery given that his wife who is 63 years old is not ambulatory, and he will require more assistance.  Findings: Good quality LIMA.  The vein graft was initially short.  Both legs were scoped.  While we are getting the last piece of vein, I elected to continue with the case and remove the cross-clamp after completing the LAD anastomosis.  I then performed the diagonal anastomosis using the off-pump stabilizer to not increase her cross-clamp time while the left thigh was being explored.  Good heart function on post cardiopulmonary bypass TEE.  Operative Technique: All invasive lines were placed in pre-op holding.  After the risks, benefits and alternatives were thoroughly discussed, the patient was brought to the operative theatre.  Anesthesia was induced, and the patient was prepped and draped in normal sterile fashion.  An appropriate surgical pause was performed, and pre-operative antibiotics were dosed accordingly.  We began with simultaneous incisions  along the right and left legs for harvesting of the greater saphenous vein and the chest for the sternotomy.  In regards to the sternotomy, this was carried down with bovie cautery, and the sternum was divided with a reciprocating saw.  Meticulous hemostasis was obtained.  The left internal thoracic artery was exposed and harvested in in pedicled fashion.  The patient was systemically heparinized, and the artery was divided distally, and placed in a papaverine sponge.    The sternal elevator was removed, and a retractor was placed.  The pericardium was divided in the midline and fashioned into a cradle with pericardial stitches.   After we confirmed an appropriate ACT, the ascending aorta was cannulated in standard fashion.  The right atrial appendage was used for venous cannulation site.  Cardiopulmonary bypass was initiated, and the heart retractor was placed. The cross clamp was applied, and a dose of anterograde cardioplegia was given with good arrest of the heart.  We moved to the posterior wall of the heart, and found a good target on the PDA.  An arteriotomy was made, and the vein graft was anastomosed to it in an end to side fashion.  Next we exposed the lateral wall, and found a good target on the OM1.  An end to side anastomosis with the vein graft was then created.    Next, we exposed a good target on the LAD, and fashioned an end to side anastomosis between it and the LITA.  We began to re-warm, and a re-animation dose of cardioplegia was given.  The heart was de-aired, and the cross clamp was removed.  This was done to not increase the cross-clamp time given that the left thigh had to be explored to harvest another saphenous vein graft.  Next, we exposed the anterior wall of the heart and identified a good target on first diagonal.   An arteriotomy was created.  The vein was anastomosed in an end to side fashion.  This was done using the off-pump stabilizer.M  eticulous hemostasis was obtained.    A  partial occludding clamp was then placed on the ascending aorta, and we created an end to side anastomosis between it and the proximal vein grafts.  The proximal sites were marked with rings.  Hemostasis was obtained, and we separated from cardiopulmonary bypass without event.the heparin was reversed with protamine.  Chest tubes and wires were placed, and the sternum was re-approximated with with sternal wires.  The soft tissue and skin were re-approximated wth absorbable suture.    The patient tolerated the procedure without any immediate complications, and was transferred to the ICU in guarded condition.  Andrew Richardson

## 2020-10-08 NOTE — Anesthesia Procedure Notes (Signed)
Central Venous Catheter Insertion Performed by: Annye Asa, MD, anesthesiologist Start/End1/08/2021 7:33 AM, 10/08/2020 7:50 AM Patient location: Pre-op. Preanesthetic checklist: patient identified, IV checked, risks and benefits discussed, surgical consent, monitors and equipment checked, pre-op evaluation, timeout performed and anesthesia consent Position: supine Lidocaine 1% used for infiltration and patient sedated Hand hygiene performed , maximum sterile barriers used  and Seldinger technique used Catheter size: 8.5 Fr Central line was placed.Sheath introducer Swan type:thermodilution Procedure performed using ultrasound guided technique. Ultrasound Notes:anatomy identified, needle tip was noted to be adjacent to the nerve/plexus identified, no ultrasound evidence of intravascular and/or intraneural injection and image(s) printed for medical record Attempts: 1 Following insertion, line sutured. Post procedure assessment: blood return through all ports, free fluid flow and no air  Post procedure complications: arrhythmia. Patient tolerated the procedure well with no immediate complications. Additional procedure comments: CVP: Timeout, sterile prep, drape, FBP R neck.  Supine position.  1% lido local, finder and trocar RIJ 1st pass with US guidance.  Cordis introducer placed over J wire, triple lumen catheter inserted through cordis. Biopatch and sterile dressing on.  Patient tolerated well.  VSS.  Jenita Seashore, MD.

## 2020-10-08 NOTE — Progress Notes (Signed)
[facility] BRIEF SICU PROGRESS NOTE  Day of Surgery  S/P Procedure(s) (LRB): CORONARY ARTERY BYPASS GRAFTING TIMES  FOUR  WITH LEFT INTERNAL MAMMARY ARTERY AND ENDOSCOPIC HARVEST OF BILATERAL  LEG SAPHENOUS VEIN (N/A) TRANSESOPHAGEAL ECHOCARDIOGRAM (TEE) (N/A)   Sedated on vent Sinus rhythm w/ stable hemodynamics O2 sats 100% on 50% FiO2 Chest tube output low UOP adequate Labs okay  Plan: Continue routine early postop  Rexene Alberts, MD 10/08/2020 5:40 PM

## 2020-10-08 NOTE — Procedures (Signed)
Extubation Procedure Note  Patient Details:   Name: Andrew Richardson DOB: 1941/08/29 MRN: [id number]   Airway Documentation:    Vent end date: 10/08/20 Vent end time: 1957   Evaluation  O2 sats: stable throughout Complications: No apparent complications Patient did tolerate procedure well. Bilateral Breath Sounds: Clear,Diminished   Yes, pt able to cough to clear secretions and hoarsely vocalized name. Pt placed on humidified 4L nasal cannula and tolerating well at this time. Pt had NIF of -30 and VC of 750 and positive for cuff leak prior to extubation.   Virgilio Frees 10/08/2020, 8:02 PM

## 2020-10-08 NOTE — Progress Notes (Signed)
  Echocardiogram Echocardiogram Transesophageal has been performed.  Andrew Richardson 10/08/2020, 10:23 AM

## 2020-10-08 NOTE — Brief Op Note (Addendum)
10/06/2020 - 10/08/2020  1:50 PM  PATIENT:  Andrew Richardson  80 y.o. male  PRE-OPERATIVE DIAGNOSIS:  CAD  POST-OPERATIVE DIAGNOSIS:  CAD  PROCEDURE:  Procedure(s): CORONARY ARTERY BYPASS GRAFTING TIMES  FOUR  WITH LEFT INTERNAL MAMMARY ARTERY AND ENDOSCOPIC HARVEST OF BILATERAL  LEG SAPHENOUS VEIN (N/A) TRANSESOPHAGEAL ECHOCARDIOGRAM (TEE) (N/A)  Right SV harvest-45  Minutes Left SV harvest-30 Minutes  LIMA to LAD SVG to OM1 SVG to PDA SVG to Diag1  SURGEON:  Surgeon(s) and Role:    * Lightfoot, Lucile Crater, MD - Primary  PHYSICIAN ASSISTANT:  Nicholes Rough, PA-C Lars Pinks, PA-C  ANESTHESIA:   general  EBL:  700 mL   BLOOD ADMINISTERED:none  DRAINS: ROUTINE   LOCAL MEDICATIONS USED:  BUPIVICAINE   SPECIMEN:  No Specimen  DISPOSITION OF SPECIMEN:  PATHOLOGY  COUNTS:  YES  DICTATION: .Dragon Dictation  PLAN OF CARE: Admit to inpatient   PATIENT DISPOSITION:  PACU - hemodynamically stable.   Delay start of Pharmacological VTE agent (>24hrs) due to surgical blood loss or risk of bleeding: yes

## 2020-10-08 NOTE — Progress Notes (Signed)
     [address]       [place] [address]             [phone]       No events Vitals:   10/08/20 0104 10/08/20 0444  BP: (!) 171/85 120/84  Pulse: (!) 59 70  Resp: 15 (!) 22  Temp: 98.4 F (36.9 C) 98 F (36.7 C)  SpO2: 97% 96%   Alert NAD Sinus EWOB  OR today for CABG3-4  Shabnam Ladd O Dylanie Quesenberry

## 2020-10-09 ENCOUNTER — Inpatient Hospital Stay (HOSPITAL_COMMUNITY): Payer: Medicare Other

## 2020-10-09 ENCOUNTER — Encounter (HOSPITAL_COMMUNITY): Payer: Self-pay | Admitting: Thoracic Surgery (Cardiothoracic Vascular Surgery)

## 2020-10-09 LAB — GLUCOSE, CAPILLARY
Glucose-Capillary: 115 mg/dL — ABNORMAL HIGH (ref 70–99)
Glucose-Capillary: 117 mg/dL — ABNORMAL HIGH (ref 70–99)
Glucose-Capillary: 129 mg/dL — ABNORMAL HIGH (ref 70–99)
Glucose-Capillary: 134 mg/dL — ABNORMAL HIGH (ref 70–99)
Glucose-Capillary: 168 mg/dL — ABNORMAL HIGH (ref 70–99)
Glucose-Capillary: 171 mg/dL — ABNORMAL HIGH (ref 70–99)

## 2020-10-09 LAB — CBC
HCT: 27.2 % — ABNORMAL LOW (ref 39.0–52.0)
HCT: 28.7 % — ABNORMAL LOW (ref 39.0–52.0)
Hemoglobin: 9.4 g/dL — ABNORMAL LOW (ref 13.0–17.0)
Hemoglobin: 9.3 g/dL — ABNORMAL LOW (ref 13.0–17.0)
MCH: 35.7 pg — ABNORMAL HIGH (ref 26.0–34.0)
MCH: 34.2 pg — ABNORMAL HIGH (ref 26.0–34.0)
MCHC: 34.6 g/dL (ref 30.0–36.0)
MCHC: 32.4 g/dL (ref 30.0–36.0)
MCV: 103.4 fL — ABNORMAL HIGH (ref 80.0–100.0)
MCV: 105.5 fL — ABNORMAL HIGH (ref 80.0–100.0)
Platelets: 119 10*3/uL — ABNORMAL LOW (ref 150–400)
Platelets: 141 10*3/uL — ABNORMAL LOW (ref 150–400)
RBC: 2.63 MIL/uL — ABNORMAL LOW (ref 4.22–5.81)
RBC: 2.72 MIL/uL — ABNORMAL LOW (ref 4.22–5.81)
RDW: 12.8 % (ref 11.5–15.5)
RDW: 13 % (ref 11.5–15.5)
WBC: 8.9 10*3/uL (ref 4.0–10.5)
WBC: 11.6 10*3/uL — ABNORMAL HIGH (ref 4.0–10.5)
nRBC: 0 % (ref 0.0–0.2)
nRBC: 0 % (ref 0.0–0.2)

## 2020-10-09 LAB — BASIC METABOLIC PANEL
Anion gap: 12 (ref 5–15)
Anion gap: 9 (ref 5–15)
BUN: 16 mg/dL (ref 8–23)
BUN: 21 mg/dL (ref 8–23)
CO2: 18 mmol/L — ABNORMAL LOW (ref 22–32)
CO2: 24 mmol/L (ref 22–32)
Calcium: 7.7 mg/dL — ABNORMAL LOW (ref 8.9–10.3)
Calcium: 8 mg/dL — ABNORMAL LOW (ref 8.9–10.3)
Chloride: 104 mmol/L (ref 98–111)
Chloride: 98 mmol/L (ref 98–111)
Creatinine, Ser: 0.89 mg/dL (ref 0.61–1.24)
Creatinine, Ser: 1.12 mg/dL (ref 0.61–1.24)
GFR, Estimated: >60 mL/min (ref >60)
GFR, Estimated: >60 mL/min (ref >60)
Glucose, Bld: 117 mg/dL — ABNORMAL HIGH (ref 70–99)
Glucose, Bld: 129 mg/dL — ABNORMAL HIGH (ref 70–99)
Potassium: 4.2 mmol/L (ref 3.5–5.1)
Potassium: 4.2 mmol/L (ref 3.5–5.1)
Sodium: 134 mmol/L — ABNORMAL LOW (ref 135–145)
Sodium: 131 mmol/L — ABNORMAL LOW (ref 135–145)

## 2020-10-09 LAB — POCT I-STAT 7, (LYTES, BLD GAS, ICA,H+H)
Acid-base deficit: 4 mmol/L — ABNORMAL HIGH (ref 0.0–2.0)
Bicarbonate: 21.1 mmol/L (ref 20.0–28.0)
Calcium, Ion: 1.19 mmol/L (ref 1.15–1.40)
HCT: 26 % — ABNORMAL LOW (ref 39.0–52.0)
Hemoglobin: 8.8 g/dL — ABNORMAL LOW (ref 13.0–17.0)
O2 Saturation: 97 %
Patient temperature: 38
Potassium: 4.4 mmol/L (ref 3.5–5.1)
Sodium: 136 mmol/L (ref 135–145)
TCO2: 22 mmol/L (ref 22–32)
pCO2 arterial: 40.7 mmHg (ref 32.0–48.0)
pH, Arterial: 7.327 — ABNORMAL LOW (ref 7.350–7.450)
pO2, Arterial: 101 mmHg (ref 83.0–108.0)

## 2020-10-09 LAB — MAGNESIUM
Magnesium: 2.4 mg/dL (ref 1.7–2.4)
Magnesium: 2.5 mg/dL — ABNORMAL HIGH (ref 1.7–2.4)

## 2020-10-09 MED ORDER — INSULIN ASPART 100 UNIT/ML ~~LOC~~ SOLN
0.0000 [IU] | SUBCUTANEOUS | Status: DC
  Administered 2020-10-09 – 2020-10-10 (×3): 2 [IU] via SUBCUTANEOUS

## 2020-10-09 MED ORDER — ENOXAPARIN SODIUM 30 MG/0.3ML ~~LOC~~ SOLN
30.0000 mg | Freq: Every day | SUBCUTANEOUS | Status: DC
  Administered 2020-10-09 – 2020-10-14 (×6): 30 mg via SUBCUTANEOUS
  Filled 2020-10-09 (×6): qty 0.3

## 2020-10-09 MED ORDER — FUROSEMIDE 10 MG/ML IJ SOLN
40.0000 mg | Freq: Once | INTRAMUSCULAR | Status: AC
  Administered 2020-10-09: 40 mg via INTRAVENOUS
  Filled 2020-10-09: qty 4

## 2020-10-09 MED FILL — Potassium Chloride Inj 2 mEq/ML: INTRAVENOUS | Qty: 40 | Status: AC

## 2020-10-09 MED FILL — Magnesium Sulfate Inj 50%: INTRAMUSCULAR | Qty: 10 | Status: AC

## 2020-10-09 MED FILL — Heparin Sodium (Porcine) Inj 1000 Unit/ML: INTRAMUSCULAR | Qty: 30 | Status: AC

## 2020-10-09 NOTE — Hospital Course (Signed)
      [address]       [place] [address]             [phone]       HPI:  80 year old male transferred from [facility] for evaluation of three-vessel coronary disease.  He originally presented with an NSTEMI after having several days of anginal symptoms 1 of which woke him up from sleep.  He then underwent a left heart cath which identified the disease.  He does endorse exertional dyspnea, and chest pressure with ambulation occasionally.  He is relatively active prior to developing symptoms in that he works in his yard.  Hospital Course:   Andrew Richardson is an 80 year old man who underwent a CABG x 4 on 10/08/2020 with Dr. Kipp Brood. He tolerated the procedure well and was transferred to the surgical ICU for continued care. He was extubated in a timely manner. POD 1 we removed his EPW, his chest tubes remained. He remained afebrile and we were advancing his diet. He had good urine output. We continued his ICU care at this time. We encouraged pulmonary toilet for weaning of oxygen support.

## 2020-10-09 NOTE — Progress Notes (Signed)
      [address]       [place] [address]             [phone]                 1 Day Post-Op Procedure(s) (LRB): CORONARY ARTERY BYPASS GRAFTING TIMES  FOUR  WITH LEFT INTERNAL MAMMARY ARTERY AND ENDOSCOPIC HARVEST OF BILATERAL  LEG SAPHENOUS VEIN (N/A) TRANSESOPHAGEAL ECHOCARDIOGRAM (TEE) (N/A)   Events: No events extubated _______________________________________________________________ Vitals: BP 108/64   Pulse 72   Temp 100.22 F (37.9 C)   Resp 20   Ht 5\' 8"  (1.727 m)   Wt 94.9 kg   SpO2 97%   BMI 31.81 kg/m   - Neuro: alert NAD  - Cardiovascular: sinus  Drips: none.   CVP:  [5 mmHg-17 mmHg] 13 mmHg  - Pulm: EWOB  ABG    Component Value Date/Time   PHART 7.327 (L) 10/09/2020 0354   PCO2ART 40.7 10/09/2020 0354   PO2ART 101 10/09/2020 0354   HCO3 21.1 10/09/2020 0354   TCO2 22 10/09/2020 0354   ACIDBASEDEF 4.0 (H) 10/09/2020 0354   O2SAT 97.0 10/09/2020 0354    - Abd: soft - Extremity: warm  .Intake/Output      01/12 0701 01/13 0700 01/13 0701 01/14 0700   P.O. 180    I.V. (mL/kg) 4121.8 (43.4)    Blood 385    IV Piggyback 1200.8    Total Intake(mL/kg) 5887.6 (62)    Urine (mL/kg/hr) 1160 (0.5)    Drains 20    Blood 700    Chest Tube 360    Total Output 2240    Net +3647.6            _______________________________________________________________ Labs: CBC Latest Ref Rng & Units 10/09/2020 10/09/2020 10/08/2020  WBC 4.0 - 10.5 K/uL - 8.9 -  Hemoglobin 13.0 - 17.0 g/dL 8.8(L) 9.4(L) 8.5(L)  Hematocrit 39.0 - 52.0 % 26.0(L) 27.2(L) 25.0(L)  Platelets 150 - 400 K/uL - 119(L) -   CMP Latest Ref Rng & Units 10/09/2020 10/09/2020 10/08/2020  Glucose 70 - 99 mg/dL - 117(H) -  BUN 8 - 23 mg/dL - 16 -  Creatinine 0.61 - 1.24 mg/dL - 0.89 -  Sodium 135 - 145 mmol/L 136 134(L) 136  Potassium 3.5 - 5.1 mmol/L 4.4 4.2 4.8  Chloride 98 - 111 mmol/L - 104 -  CO2 22 - 32 mmol/L - 18(L) -  Calcium 8.9 - 10.3 mg/dL - 7.7(L) -     CXR: Small L effusion  _______________________________________________________________  Assessment and Plan: POD 1 s/p CABG  Neuro: heating pads for back pain CV: on A/S/BB.  Will remove wires Pulm: continue pulm toilet Renal: good uop. GI: advancing diet Heme: stable ID: afebrile Endo: SSI Dispo: continue ICU care   Sotiria Keast O Josiane Labine 10/09/2020 8:07 AM

## 2020-10-09 NOTE — Progress Notes (Signed)
      [address]       [place] [address]             [phone]      POD # 1 CABG  Eating dinner  BP (!) 145/62   Pulse 66   Temp 97.7 F (36.5 C) (Oral)   Resp (!) 21   Ht 5\' 8"  (1.727 m)   Wt 94.9 kg   SpO2 97%   BMI 31.81 kg/m  1L [place] 98% sat  Intake/Output Summary (Last 24 hours) at 10/09/2020 1750 Last data filed at 10/09/2020 1700 Gross per 24 hour  Intake 1605.03 ml  Output 1197 ml  Net 408.03 ml   CBG mildly elevated PM labs pending  Remo Lipps C. Roxan Hockey, MD [facility] [phone]

## 2020-10-10 ENCOUNTER — Inpatient Hospital Stay (HOSPITAL_COMMUNITY): Payer: Medicare Other

## 2020-10-10 LAB — BASIC METABOLIC PANEL
Anion gap: 9 (ref 5–15)
BUN: 24 mg/dL — ABNORMAL HIGH (ref 8–23)
CO2: 24 mmol/L (ref 22–32)
Calcium: 8.1 mg/dL — ABNORMAL LOW (ref 8.9–10.3)
Chloride: 98 mmol/L (ref 98–111)
Creatinine, Ser: 1.03 mg/dL (ref 0.61–1.24)
GFR, Estimated: >60 mL/min (ref >60)
Glucose, Bld: 107 mg/dL — ABNORMAL HIGH (ref 70–99)
Potassium: 4.2 mmol/L (ref 3.5–5.1)
Sodium: 131 mmol/L — ABNORMAL LOW (ref 135–145)

## 2020-10-10 LAB — CBC
HCT: 27.1 % — ABNORMAL LOW (ref 39.0–52.0)
Hemoglobin: 8.9 g/dL — ABNORMAL LOW (ref 13.0–17.0)
MCH: 34.6 pg — ABNORMAL HIGH (ref 26.0–34.0)
MCHC: 32.8 g/dL (ref 30.0–36.0)
MCV: 105.4 fL — ABNORMAL HIGH (ref 80.0–100.0)
Platelets: 134 10*3/uL — ABNORMAL LOW (ref 150–400)
RBC: 2.57 MIL/uL — ABNORMAL LOW (ref 4.22–5.81)
RDW: 13 % (ref 11.5–15.5)
WBC: 8.8 10*3/uL (ref 4.0–10.5)
nRBC: 0 % (ref 0.0–0.2)

## 2020-10-10 LAB — GLUCOSE, CAPILLARY
Glucose-Capillary: 101 mg/dL — ABNORMAL HIGH (ref 70–99)
Glucose-Capillary: 121 mg/dL — ABNORMAL HIGH (ref 70–99)
Glucose-Capillary: 123 mg/dL — ABNORMAL HIGH (ref 70–99)
Glucose-Capillary: 115 mg/dL — ABNORMAL HIGH (ref 70–99)
Glucose-Capillary: 118 mg/dL — ABNORMAL HIGH (ref 70–99)

## 2020-10-10 MED ORDER — SODIUM CHLORIDE 0.9% FLUSH
3.0000 mL | Freq: Two times a day (BID) | INTRAVENOUS | Status: DC
  Administered 2020-10-10 – 2020-10-15 (×8): 3 mL via INTRAVENOUS

## 2020-10-10 MED ORDER — SODIUM CHLORIDE 0.9% FLUSH
3.0000 mL | INTRAVENOUS | Status: DC | PRN
  Administered 2020-10-11 – 2020-10-13 (×2): 3 mL via INTRAVENOUS

## 2020-10-10 MED ORDER — FUROSEMIDE 40 MG PO TABS
40.0000 mg | ORAL_TABLET | Freq: Every day | ORAL | Status: DC
  Administered 2020-10-10 – 2020-10-15 (×6): 40 mg via ORAL
  Filled 2020-10-10 (×6): qty 1

## 2020-10-10 MED ORDER — POTASSIUM CHLORIDE CRYS ER 20 MEQ PO TBCR
40.0000 meq | EXTENDED_RELEASE_TABLET | Freq: Every day | ORAL | Status: DC
  Administered 2020-10-10 – 2020-10-14 (×5): 40 meq via ORAL
  Filled 2020-10-10 (×5): qty 2

## 2020-10-10 MED ORDER — SODIUM CHLORIDE 0.9 % IV SOLN: 250.0000 mL | INTRAVENOUS | Status: DC | PRN

## 2020-10-10 MED ORDER — ~~LOC~~ CARDIAC SURGERY, PATIENT & FAMILY EDUCATION: Freq: Once | Status: AC

## 2020-10-10 MED FILL — Albumin, Human Inj 5%: INTRAVENOUS | Qty: 250 | Status: AC

## 2020-10-10 MED FILL — Sodium Bicarbonate IV Soln 8.4%: INTRAVENOUS | Qty: 50 | Status: AC

## 2020-10-10 MED FILL — Heparin Sodium (Porcine) Inj 1000 Unit/ML: INTRAMUSCULAR | Qty: 10 | Status: AC

## 2020-10-10 MED FILL — Mannitol IV Soln 20%: INTRAVENOUS | Qty: 500 | Status: AC

## 2020-10-10 MED FILL — Sodium Chloride IV Soln 0.9%: INTRAVENOUS | Qty: 3000 | Status: AC

## 2020-10-10 MED FILL — Calcium Chloride Inj 10%: INTRAVENOUS | Qty: 10 | Status: AC

## 2020-10-10 MED FILL — Electrolyte-R (PH 7.4) Solution: INTRAVENOUS | Qty: 4000 | Status: AC

## 2020-10-10 NOTE — Progress Notes (Signed)
Mobility Specialist - Progress Note   10/10/20 1637  Mobility  Activity Ambulated in hall  Level of Assistance Minimal assist, patient does 75% or more  Assistive Device Front wheel walker  Distance Ambulated (ft) 100 ft  Mobility Response Tolerated fair  Mobility performed by Mobility specialist  $Mobility charge 1 Mobility    Pre-mobility: 75 HR, 148/73 BP, 93% SpO2 Post-mobility: 82 HR, 168/81 BP, 95% SpO2  Pt required 2 brief standing rest breaks due to fatigue. He ambulated on RA. Pt min assist to elevate trunk when sitting up on edge of bed and to stand in order to adhere to sternal precautions. Pt back in bed after walk.   Pricilla Handler Mobility Specialist Mobility Specialist Phone: [phone]

## 2020-10-10 NOTE — Progress Notes (Signed)
      [address]       [place] [address]             [phone]                 2 Days Post-Op Procedure(s) (LRB): CORONARY ARTERY BYPASS GRAFTING TIMES  FOUR  WITH LEFT INTERNAL MAMMARY ARTERY AND ENDOSCOPIC HARVEST OF BILATERAL  LEG SAPHENOUS VEIN (N/A) TRANSESOPHAGEAL ECHOCARDIOGRAM (TEE) (N/A)   Events: No events  _______________________________________________________________ Vitals: BP 105/64   Pulse 78   Temp 98.2 F (36.8 C) (Oral)   Resp 19   Ht 5\' 8"  (1.727 m)   Wt 92.7 kg   SpO2 93%   BMI 31.07 kg/m   - Neuro: alert NAD  - Cardiovascular: sinus  Drips: none.      - Pulm: EWOB  ABG    Component Value Date/Time   PHART 7.327 (L) 10/09/2020 0354   PCO2ART 40.7 10/09/2020 0354   PO2ART 101 10/09/2020 0354   HCO3 21.1 10/09/2020 0354   TCO2 22 10/09/2020 0354   ACIDBASEDEF 4.0 (H) 10/09/2020 0354   O2SAT 97.0 10/09/2020 0354    - Abd: soft - Extremity: warm  .Intake/Output      01/13 0701 01/14 0700 01/14 0701 01/15 0700   P.O. 300    I.V. (mL/kg) 457.5 (4.9)    Blood     IV Piggyback 200.2    Total Intake(mL/kg) 957.7 (10.3)    Urine (mL/kg/hr) 1024 (0.5)    Drains 25    Blood     Chest Tube 130    Total Output 1179    Net -221.3            _______________________________________________________________ Labs: CBC Latest Ref Rng & Units 10/10/2020 10/09/2020 10/09/2020  WBC 4.0 - 10.5 K/uL 8.8 11.6(H) -  Hemoglobin 13.0 - 17.0 g/dL 8.9(L) 9.3(L) 8.8(L)  Hematocrit 39.0 - 52.0 % 27.1(L) 28.7(L) 26.0(L)  Platelets 150 - 400 K/uL 134(L) 141(L) -   CMP Latest Ref Rng & Units 10/10/2020 10/09/2020 10/09/2020  Glucose 70 - 99 mg/dL 107(H) 129(H) -  BUN 8 - 23 mg/dL 24(H) 21 -  Creatinine 0.61 - 1.24 mg/dL 1.03 1.12 -  Sodium 135 - 145 mmol/L 131(L) 131(L) 136  Potassium 3.5 - 5.1 mmol/L 4.2 4.2 4.4  Chloride 98 - 111 mmol/L 98 98 -  CO2 22 - 32 mmol/L 24 24 -  Calcium 8.9 - 10.3 mg/dL 8.1(L) 8.0(L) -     CXR: Small L effusion  _______________________________________________________________  Assessment and Plan: POD 2 s/p CABG  Neuro: pain controlled CV: on A/S/BB.   Pulm: continue pulm toilet Renal: good uop. Will diurese today GI: on diet Heme: stable ID: afebrile Endo: SSI Dispo: floor today    Lajuana Matte 10/10/2020 9:11 AM

## 2020-10-10 NOTE — Progress Notes (Signed)
CARDIAC REHAB PHASE I   PRE:  Rate/Rhythm: 65 SR  BP:  Sitting: 122/72      SaO2: 99 2L  MODE:  Ambulation: 190 ft   POST:  Rate/Rhythm: 88 SR with PVCs  BP:  Sitting: 133/67    SaO2: 97 RA   Pt agreeable to ambulate. Pt ambulated 128ft in hallway assist of 2 for safety with EVA. Pt took several short standing rest breaks c/o fatigue. Pt returned to recliner. Encouraged continued ambulation and IS use.  Pt requesting [place] at d/c. Will continue to follow.  [phone] Rufina Falco, RN BSN 10/10/2020 11:20 AM

## 2020-10-11 LAB — GLUCOSE, CAPILLARY
Glucose-Capillary: 102 mg/dL — ABNORMAL HIGH (ref 70–99)
Glucose-Capillary: 108 mg/dL — ABNORMAL HIGH (ref 70–99)
Glucose-Capillary: 116 mg/dL — ABNORMAL HIGH (ref 70–99)
Glucose-Capillary: 129 mg/dL — ABNORMAL HIGH (ref 70–99)
Glucose-Capillary: 95 mg/dL (ref 70–99)

## 2020-10-11 LAB — BASIC METABOLIC PANEL
Anion gap: 9 (ref 5–15)
BUN: 24 mg/dL — ABNORMAL HIGH (ref 8–23)
CO2: 24 mmol/L (ref 22–32)
Calcium: 8.3 mg/dL — ABNORMAL LOW (ref 8.9–10.3)
Chloride: 98 mmol/L (ref 98–111)
Creatinine, Ser: 0.86 mg/dL (ref 0.61–1.24)
GFR, Estimated: >60 mL/min (ref >60)
Glucose, Bld: 108 mg/dL — ABNORMAL HIGH (ref 70–99)
Potassium: 4.5 mmol/L (ref 3.5–5.1)
Sodium: 131 mmol/L — ABNORMAL LOW (ref 135–145)

## 2020-10-11 LAB — CBC
HCT: 28.8 % — ABNORMAL LOW (ref 39.0–52.0)
Hemoglobin: 9.4 g/dL — ABNORMAL LOW (ref 13.0–17.0)
MCH: 33.8 pg (ref 26.0–34.0)
MCHC: 32.6 g/dL (ref 30.0–36.0)
MCV: 103.6 fL — ABNORMAL HIGH (ref 80.0–100.0)
Platelets: 155 10*3/uL (ref 150–400)
RBC: 2.78 MIL/uL — ABNORMAL LOW (ref 4.22–5.81)
RDW: 13.1 % (ref 11.5–15.5)
WBC: 8.4 10*3/uL (ref 4.0–10.5)
nRBC: 0 % (ref 0.0–0.2)

## 2020-10-11 MED ORDER — ALPRAZOLAM 0.25 MG PO TABS
0.2500 mg | ORAL_TABLET | Freq: Once | ORAL | Status: AC
  Administered 2020-10-11: 0.25 mg via ORAL
  Filled 2020-10-11: qty 1

## 2020-10-11 MED ORDER — LOSARTAN POTASSIUM 25 MG PO TABS
25.0000 mg | ORAL_TABLET | Freq: Every day | ORAL | Status: DC
  Administered 2020-10-11 – 2020-10-12 (×2): 25 mg via ORAL
  Filled 2020-10-11 (×2): qty 1

## 2020-10-11 MED ORDER — TAMSULOSIN HCL 0.4 MG PO CAPS
0.4000 mg | ORAL_CAPSULE | Freq: Every day | ORAL | Status: DC
  Administered 2020-10-11 – 2020-10-15 (×5): 0.4 mg via ORAL
  Filled 2020-10-11 (×5): qty 1

## 2020-10-11 NOTE — Progress Notes (Signed)
Mobility Specialist - Progress Note   10/11/20 1525  Mobility  Activity Ambulated in hall  Level of Assistance Standby assist, set-up cues, supervision of patient - no hands on  Assistive Device Front wheel walker  Distance Ambulated (ft) 200 ft  Mobility Response Tolerated well  Mobility performed by Mobility specialist  $Mobility charge 1 Mobility    Pre-mobility: 82 HR, 130/108 BP Post-mobility: 84 HR, 174/84 BP  Pt able to stand w/o assistance while adhering to sternal precautions. He required several standing rest breaks during ambulation. Pt c/o feeling anxious after his walk, RN present in room.  Pricilla Handler Mobility Specialist Mobility Specialist Phone: [phone]

## 2020-10-11 NOTE — Progress Notes (Signed)
CARDIAC REHAB PHASE I   PRE:  Rate/Rhythm: 26 SR w/ PVCs  BP:  Supine: 152/80 Sitting:   Standing:    SaO2: 95% RA  MODE:  Ambulation: 196 ft   POST:  Rate/Rhythm: 103 ST w/ PVCs  BP:  Supine:   Sitting: 163/82  Standing:    SaO2: 94% RA  0951-1016 Prior to walk, patient c/o feeling tired today, didn't sleep well last night. Patient tolerated ambulation fair with assist x2, and pushing rolling walker, gait belt used. Multiple standing rest breaks taken, c/o shortness of breath. Assisted patient to bed after walk, call bell within reach.  Sol Passer, MS, ACSM CEP

## 2020-10-11 NOTE — Progress Notes (Signed)
Pt called RN to room to express increasing anxiety, sweating and feeling short of breath.  Pt's BP slightly elevated 130/108.  Other vitals stable.  O2 sat 96% on room air.  Pt and wife expressed to RN that pt has previously had anxiety attacks and was usually able to "exercise them away".  Pt and wife expressed their concern that while recuperating from surgery, pt would not be able to vigorously exercise and may need a medication at home to ease his anxiety.

## 2020-10-11 NOTE — Evaluation (Signed)
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 PMH:CAD. adenoma of collon, pre-diabeties, hyperlipidemia, HTN, depression  Clinical Impression  Patient presents with decreased endurance with ambulation and mobility tasks. He requires guarding with gait. He required 2 standing rest breaks with ambulation today. His bedroom at home is on the second floor. He has 3 steps into the house. He would benefit from rehab to improve endurance and functional mobility. Skilled therapy will continue to follow.     Follow Up Recommendations SNF    Equipment Recommendations  Rolling walker with 5" wheels    Recommendations for Other Services       Precautions / Restrictions Precautions Precautions: None Restrictions Weight Bearing Restrictions: Yes Other Position/Activity Restrictions: Sternal percuations      Mobility  Bed Mobility Overal bed mobility: Needs Assistance Bed Mobility: Supine to Sit;Sit to Supine     Supine to sit: Min guard Sit to supine: Min guard   General bed mobility comments: min guard to get in and out of bed. Min a to slide up the bed    Transfers Overall transfer level: Needs assistance Equipment used: Rolling walker (2 wheeled) Transfers: Sit to/from Stand Sit to Stand: Min guard         General transfer comment: cuing for sternal percuations min gaurd for intial balance  Ambulation/Gait Ambulation/Gait assistance: Min guard Gait Distance (Feet): 196 Feet Assistive device: Rolling walker (2 wheeled) Gait Pattern/deviations: Step-through pattern Gait velocity: decreased Gait velocity interpretation: <1.31 ft/sec, indicative of household ambulator General Gait Details: Patient required tow standing rest breaks. he reported feeling short of breath after ambualtion but was feeling better with rest. Patient  ambualted with cardiac rehab monitoring his vitals.  Stairs            Wheelchair Mobility    Modified Rankin (Stroke Patients Only)       Balance Overall balance assessment: Needs assistance Sitting-balance support: Bilateral upper extremity supported Sitting balance-Leahy Scale: Good     Standing balance support: Bilateral upper extremity supported Standing balance-Leahy Scale: Poor                               Pertinent Vitals/Pain Pain Assessment: Faces Faces Pain Scale: Hurts a little bit Pain Descriptors / Indicators: Aching Pain Intervention(s): Limited activity within patient's tolerance;Monitored during session    Home Living Family/patient expects to be discharged to:: Private residence Living Arrangements: Spouse/significant other Available Help at Discharge: Family Type of Home: House Home Access: Stairs to enter   Technical brewer of Steps: 3 Home Layout: Two level   Additional Comments: bedroom is on the scond floor. Lives with wife who alos is slderly    Prior Function Level of Independence: Independent         Comments: Patient reports he was mobilie and activie prior to hospitilization     Hand Dominance   Dominant Hand: Right    Extremity/Trunk Assessment   Upper Extremity Assessment Upper Extremity Assessment: Overall WFL for tasks assessed (not tested 2nd to sternal percautions but appeared WNL)    Lower Extremity Assessment Lower Extremity Assessment: Generalized weakness    Cervical / Trunk Assessment Cervical / Trunk Assessment: Normal  Communication   Communication: No difficulties  Cognition Arousal/Alertness: Awake/alert Behavior During Therapy: WFL for tasks assessed/performed  General Comments      Exercises     Assessment/Plan    PT Assessment Patient needs continued PT services  PT Problem List Decreased strength;Decreased  range of motion;Decreased activity tolerance;Decreased mobility;Pain       PT Treatment Interventions DME instruction;Gait training;Stair training;Functional mobility training;Therapeutic activities;Therapeutic exercise;Neuromuscular re-education;Patient/family education    PT Goals (Current goals can be found in the Care Plan section)  Acute Rehab PT Goals Patient Stated Goal: to get stronger PT Goal Formulation: With patient Time For Goal Achievement: 10/18/20 (w+1) Potential to Achieve Goals: Good    Frequency Min 3X/week   Barriers to discharge        Co-evaluation               AM-PAC PT "6 Clicks" Mobility  Outcome Measure Help needed turning from your back to your side while in a flat bed without using bedrails?: A Little Help needed moving from lying on your back to sitting on the side of a flat bed without using bedrails?: A Little Help needed moving to and from a bed to a chair (including a wheelchair)?: A Little Help needed standing up from a chair using your arms (e.g., wheelchair or bedside chair)?: A Little Help needed to walk in hospital room?: A Lot Help needed climbing 3-5 steps with a railing? : Total 6 Click Score: 15    End of Session Equipment Utilized During Treatment: Gait belt Activity Tolerance: Patient tolerated treatment well Patient left: in bed;with call bell/phone within reach (per patient request) Nurse Communication: Mobility status PT Visit Diagnosis: Other abnormalities of gait and mobility (R26.89)    Time: 9024-0973 PT Time Calculation (min) (ACUTE ONLY): 25 min   Charges:   PT Evaluation $PT Eval Moderate Complexity: 1 Mod            Carney Living 10/11/2020, 12:14 PM

## 2020-10-11 NOTE — Progress Notes (Addendum)
      [address]       [place] [address]             [phone]      3 Days Post-Op Procedure(s) (LRB): CORONARY ARTERY BYPASS GRAFTING TIMES  FOUR  WITH LEFT INTERNAL MAMMARY ARTERY AND ENDOSCOPIC HARVEST OF BILATERAL  LEG SAPHENOUS VEIN (N/A) TRANSESOPHAGEAL ECHOCARDIOGRAM (TEE) (N/A) Subjective: Feels good this morning. He hasn't been able to eat a lot since surgery  Objective: Vital signs in last 24 hours: Temp:  [97.9 F (36.6 C)-98.8 F (37.1 C)] 98.8 F (37.1 C) (01/15 0815) Pulse Rate:  [62-81] 70 (01/15 0815) Cardiac Rhythm: Heart block (01/14 1900) Resp:  [14-24] 18 (01/15 0815) BP: (117-153)/(63-83) 128/78 (01/15 0815) SpO2:  [89 %-100 %] 94 % (01/15 0815) Weight:  [92.3 kg] 92.3 kg (01/15 0612)     Intake/Output from previous day: 01/14 0701 - 01/15 0700 In: 501.3 [P.O.:460; I.V.:41.3] Out: 830 [Urine:830] Intake/Output this shift: Total I/O In: 120 [P.O.:120] Out: 150 [Urine:150]  General appearance: alert, cooperative and no distress Heart: regular rate and rhythm, S1, S2 normal, no murmur, click, rub or gallop Lungs: clear to auscultation bilaterally Abdomen: soft, non-tender; bowel sounds normal; no masses,  no organomegaly Extremities: 1-2 + non pitting edema Wound: clean and dry  Lab Results: Recent Labs    10/10/20 0405 10/11/20 0224  WBC 8.8 8.4  HGB 8.9* 9.4*  HCT 27.1* 28.8*  PLT 134* 155   BMET:  Recent Labs    10/10/20 0405 10/11/20 0224  NA 131* 131*  K 4.2 4.5  CL 98 98  CO2 24 24  GLUCOSE 107* 108*  BUN 24* 24*  CREATININE 1.03 0.86  CALCIUM 8.1* 8.3*    PT/INR:  Recent Labs    10/08/20 1432  LABPROT 16.6*  INR 1.4*   ABG    Component Value Date/Time   PHART 7.327 (L) 10/09/2020 0354   HCO3 21.1 10/09/2020 0354   TCO2 22 10/09/2020 0354   ACIDBASEDEF 4.0 (H) 10/09/2020 0354   O2SAT 97.0 10/09/2020 0354   CBG (last 3)  Recent Labs    10/11/20 0039 10/11/20 0359 10/11/20 0817   GLUCAP 116* 102* 108*    Assessment/Plan: S/P Procedure(s) (LRB): CORONARY ARTERY BYPASS GRAFTING TIMES  FOUR  WITH LEFT INTERNAL MAMMARY ARTERY AND ENDOSCOPIC HARVEST OF BILATERAL  LEG SAPHENOUS VEIN (N/A) TRANSESOPHAGEAL ECHOCARDIOGRAM (TEE) (N/A)  1. CV-NSR in the 70s, BP well controlled. Continue asa, statin, BB.  2. Pulm- On room air with good oxygen saturation, CXR yesterday showed: Persistent bibasilar atelectasis and small pleural effusions 3. Renal-creatinine 0.86, electrolytes okay. Continue lasix for fluid overload. Remains about 5kg over baseline weight.  4. H and H 9.4/28.8, stable, expected acute blood loss anemia 5. Endo-well controlled blood glucose.  Plan: Doing well POD 3. Plans to do some walking around the unit today. Working on oral intake. Making good progress.     LOS: 5 days    Andrew Richardson 10/11/2020   I have seen and examined the patient and agree with the assessment and plan as outlined.  BP running somewhat elevated.  Will restart home dose Flomax and reduced dose Cozaar.  Mobilize.  Routine care.  Rexene Alberts, MD 10/11/2020 12:55 PM

## 2020-10-12 LAB — CBC
HCT: 25 % — ABNORMAL LOW (ref 39.0–52.0)
Hemoglobin: 8.7 g/dL — ABNORMAL LOW (ref 13.0–17.0)
MCH: 35.4 pg — ABNORMAL HIGH (ref 26.0–34.0)
MCHC: 34.8 g/dL (ref 30.0–36.0)
MCV: 101.6 fL — ABNORMAL HIGH (ref 80.0–100.0)
Platelets: 184 10*3/uL (ref 150–400)
RBC: 2.46 MIL/uL — ABNORMAL LOW (ref 4.22–5.81)
RDW: 12.9 % (ref 11.5–15.5)
WBC: 4.9 10*3/uL (ref 4.0–10.5)
nRBC: 0 % (ref 0.0–0.2)

## 2020-10-12 LAB — BASIC METABOLIC PANEL
Anion gap: 9 (ref 5–15)
BUN: 21 mg/dL (ref 8–23)
CO2: 26 mmol/L (ref 22–32)
Calcium: 8.2 mg/dL — ABNORMAL LOW (ref 8.9–10.3)
Chloride: 97 mmol/L — ABNORMAL LOW (ref 98–111)
Creatinine, Ser: 0.85 mg/dL (ref 0.61–1.24)
GFR, Estimated: >60 mL/min (ref >60)
Glucose, Bld: 112 mg/dL — ABNORMAL HIGH (ref 70–99)
Potassium: 4.4 mmol/L (ref 3.5–5.1)
Sodium: 132 mmol/L — ABNORMAL LOW (ref 135–145)

## 2020-10-12 MED ORDER — ALPRAZOLAM 0.25 MG PO TABS
0.2500 mg | ORAL_TABLET | Freq: Every evening | ORAL | Status: DC | PRN
  Administered 2020-10-13: 0.25 mg via ORAL
  Filled 2020-10-12: qty 1

## 2020-10-12 NOTE — Social Work (Signed)
CSW attempted to reach pt via his room however was unable to reach him. CSW messaged RN awaiting a response. CSW wanted to discuss SNF recommendation.

## 2020-10-12 NOTE — Progress Notes (Addendum)
      [address]       [place], [address]             [phone]      4 Days Post-Op Procedure(s) (LRB): CORONARY ARTERY BYPASS GRAFTING TIMES  FOUR  WITH LEFT INTERNAL MAMMARY ARTERY AND ENDOSCOPIC HARVEST OF BILATERAL  LEG SAPHENOUS VEIN (N/A) TRANSESOPHAGEAL ECHOCARDIOGRAM (TEE) (N/A) Subjective: Feels better this morning. He was able to feel better from his anxiety attack after one dose of xanax and had a productive rest of the day.   Objective: Vital signs in last 24 hours: Temp:  [98.2 F (36.8 C)-98.8 F (37.1 C)] 98.3 F (36.8 C) (01/15 2328) Pulse Rate:  [70-82] 70 (01/15 2328) Cardiac Rhythm: Atrial fibrillation (01/16 0746) Resp:  [16-20] 20 (01/16 0543) BP: (128-154)/(78-108) 136/78 (01/15 2328) SpO2:  [94 %-98 %] 98 % (01/15 2328) Weight:  [93.4 kg] 93.4 kg (01/16 0543)     Intake/Output from previous day: 01/15 0701 - 01/16 0700 In: 1565 [P.O.:600; I.V.:965] Out: 850 [Urine:850] Intake/Output this shift: No intake/output data recorded.  General appearance: alert, cooperative and no distress Heart: regular rate and rhythm, S1, S2 normal, no murmur, click, rub or gallop Lungs: clear to auscultation bilaterally Abdomen: soft, non-tender; bowel sounds normal; no masses,  no organomegaly Extremities: bilateral non pitting edema Wound: clean and dry  Lab Results: Recent Labs    10/11/20 0224 10/12/20 0149  WBC 8.4 4.9  HGB 9.4* 8.7*  HCT 28.8* 25.0*  PLT 155 184   BMET:  Recent Labs    10/11/20 0224 10/12/20 0149  NA 131* 132*  K 4.5 4.4  CL 98 97*  CO2 24 26  GLUCOSE 108* 112*  BUN 24* 21  CREATININE 0.86 0.85  CALCIUM 8.3* 8.2*    PT/INR: No results for input(s): LABPROT, INR in the last 72 hours. ABG    Component Value Date/Time   PHART 7.327 (L) 10/09/2020 0354   HCO3 21.1 10/09/2020 0354   TCO2 22 10/09/2020 0354   ACIDBASEDEF 4.0 (H) 10/09/2020 0354   O2SAT 97.0 10/09/2020 0354   CBG (last 3)  Recent Labs     10/11/20 0817 10/11/20 1128 10/11/20 2046  GLUCAP 108* 95 129*    Assessment/Plan: S/P Procedure(s) (LRB): CORONARY ARTERY BYPASS GRAFTING TIMES  FOUR  WITH LEFT INTERNAL MAMMARY ARTERY AND ENDOSCOPIC HARVEST OF BILATERAL  LEG SAPHENOUS VEIN (N/A) TRANSESOPHAGEAL ECHOCARDIOGRAM (TEE) (N/A)  1. CV-NSR in the 70s, BP well controlled. Continue asa, statin, BB.  2. Pulm- On room air with good oxygen saturation, CXR yesterday showed: Persistent bibasilar atelectasis and small pleural effusions 3. Renal-creatinine 0.85, electrolytes okay. Continue lasix for fluid overload. Remains about 6kg over baseline weight.  4. H and H 8.7/25.0, stable, expected acute blood loss anemia 5. Endo-well controlled blood glucose.  Plan: Continue diuretics for fluid overload. Continue ambulation in the halls. Case management and PT consult for disposition.    LOS: 6 days    Elgie Collard 10/12/2020   I have seen and examined the patient and agree with the assessment and plan as outlined.  Rexene Alberts, MD 10/12/2020 11:20 AM

## 2020-10-12 NOTE — NC FL2 (Signed)
[place] LEVEL OF CARE SCREENING TOOL     IDENTIFICATION  Patient Name: Andrew Richardson Birthdate: 07-Nov-1940 Sex: male Admission Date (Current Location): 10/06/2020  [facility] and [place] Number:  Engineering geologist and Address:  [facility] [place], [place] [address], [place], [place] [address]      Provider Number: [id number]  Attending Physician Name and Address:  Lajuana Matte, MD  Relative Name and Phone Number:       Current Level of Care: Hospital Recommended Level of Care: [place] Prior Approval Number:    Date Approved/Denied:   PASRR Number: [id number]  Discharge Plan: SNF    Current Diagnoses: Patient Active Problem List   Diagnosis Date Noted  . 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 NSTEMI (non-ST elevated myocardial infarction) ([place]) 10/05/2020  . CAD S/P percutaneous coronary angioplasty 10/05/2020  . HTN (hypertension) 10/05/2020  . HLD (hyperlipidemia) 10/05/2020    Orientation RESPIRATION BLADDER Height & Weight     Self,Time,Situation,Place  Normal Continent Weight: 205 lb 12.8 oz (93.4 kg) Height:  5\' 8"  (172.7 cm)  BEHAVIORAL SYMPTOMS/MOOD NEUROLOGICAL BOWEL NUTRITION STATUS      Continent Diet (please see discharge summary)  AMBULATORY STATUS COMMUNICATION OF NEEDS Skin   Limited Assist Verbally Surgical wounds (closed incision RT & LFT leg, closed incision Chest)                       Personal Care Assistance Level of Assistance  Bathing,Feeding,Dressing Bathing Assistance: Limited assistance Feeding assistance: Independent Dressing Assistance: Limited assistance     Functional Limitations Info  Sight,Hearing,Speech Sight Info: Adequate Hearing Info: Adequate Speech Info: Adequate    SPECIAL CARE FACTORS FREQUENCY  PT (By licensed PT),OT (By licensed OT)     PT Frequency: 5x per week OT Frequency: 5x per week            Contractures Contractures Info: Not present     Additional Factors Info  Code Status,Allergies Code Status Info: FULL Allergies Info: Simvastatin,Tetracyclines & Related,Nitrofurantoin           Current Medications (10/12/2020):  This is the current hospital active medication list Current Facility-Administered Medications  Medication Dose Route Frequency Provider Last Rate Last Admin  . 0.9 %  sodium chloride infusion  250 mL Intravenous Continuous Conte, Tessa N, PA-C      . 0.9 %  sodium chloride infusion  250 mL Intravenous PRN Lightfoot, Lucile Crater, MD      . acetaminophen (TYLENOL) tablet 1,000 mg  1,000 mg Oral Q6H Conte, Tessa N, PA-C   1,000 mg at 10/12/20 1324  . ALPRAZolam Duanne Moron) tablet 0.25 mg  0.25 mg Oral QHS PRN Rexene Alberts, MD      . aspirin EC tablet 325 mg  325 mg Oral Daily Elgie Collard, [place]   325 mg at 10/12/20 7510  . atorvastatin (LIPITOR) tablet 10 mg  10 mg Oral QPM Harriet Pho, Tessa N, PA-C   10 mg at 10/11/20 1815  . bisacodyl (DULCOLAX) EC tablet 10 mg  10 mg Oral Daily Nicholes Rough N, PA-C   10 mg at 10/12/20 2585   Or  . bisacodyl (DULCOLAX) suppository 10 mg  10 mg Rectal Daily Harriet Pho, Tessa N, PA-C      . Chlorhexidine Gluconate Cloth 2 % PADS 6 each  6 each Topical Daily Lajuana Matte, MD   6 each at 10/12/20 0931  . docusate  sodium (COLACE) capsule 200 mg  200 mg Oral Daily Elgie Collard, PA-C   200 mg at 10/12/20 1914  . enoxaparin (LOVENOX) injection 30 mg  30 mg Subcutaneous QHS Lightfoot, Lucile Crater, MD   30 mg at 10/11/20 2047  . furosemide (LASIX) tablet 40 mg  40 mg Oral Daily Lajuana Matte, MD   40 mg at 10/12/20 0927  . lactated ringers infusion   Intravenous Continuous Elgie Collard, PA-C   Held at 10/08/20 1415  . lactated ringers infusion   Intravenous Continuous Elgie Collard, [place]   Stopped at 10/11/20 7829  . levothyroxine (SYNTHROID) tablet 50 mcg  50 mcg Oral Q0600 Elgie Collard, PA-C   50 mcg at 10/12/20 0544  . losartan (COZAAR) tablet 25 mg  25 mg Oral Daily  Rexene Alberts, MD   25 mg at 10/12/20 5621  . MEDLINE mouth rinse  15 mL Mouth Rinse BID Lajuana Matte, MD   15 mL at 10/12/20 0931  . metoprolol tartrate (LOPRESSOR) injection 2.5-5 mg  2.5-5 mg Intravenous Q2H PRN Harriet Pho, Tessa N, PA-C      . metoprolol tartrate (LOPRESSOR) tablet 12.5 mg  12.5 mg Oral BID Harriet Pho, Tessa N, PA-C   12.5 mg at 10/12/20 3086  . ondansetron (ZOFRAN) injection 4 mg  4 mg Intravenous Q6H PRN Elgie Collard, PA-C   4 mg at 10/10/20 5784  . oxyCODONE (Oxy IR/ROXICODONE) immediate release tablet 5-10 mg  5-10 mg Oral Q3H PRN Nicholes Rough N, PA-C   10 mg at 10/12/20 0544  . pantoprazole (PROTONIX) EC tablet 40 mg  40 mg Oral Daily Elgie Collard, PA-C   40 mg at 10/12/20 6962  . potassium chloride SA (KLOR-CON) CR tablet 40 mEq  40 mEq Oral Daily Lajuana Matte, MD   40 mEq at 10/12/20 0927  . sodium chloride flush (NS) 0.9 % injection 10-40 mL  10-40 mL Intracatheter Q12H Lightfoot, Harrell O, MD   10 mL at 10/10/20 0900  . sodium chloride flush (NS) 0.9 % injection 10-40 mL  10-40 mL Intracatheter PRN Lightfoot, Harrell O, MD      . sodium chloride flush (NS) 0.9 % injection 3 mL  3 mL Intravenous Q12H Conte, Tessa N, PA-C   3 mL at 10/10/20 2142  . sodium chloride flush (NS) 0.9 % injection 3 mL  3 mL Intravenous PRN Elgie Collard, PA-C   3 mL at 10/11/20 2049  . sodium chloride flush (NS) 0.9 % injection 3 mL  3 mL Intravenous Q12H Lajuana Matte, MD   3 mL at 10/12/20 0932  . sodium chloride flush (NS) 0.9 % injection 3 mL  3 mL Intravenous PRN Lajuana Matte, MD   3 mL at 10/11/20 2049  . tamsulosin (FLOMAX) capsule 0.4 mg  0.4 mg Oral Daily Rexene Alberts, MD   0.4 mg at 10/12/20 9528  . traMADol (ULTRAM) tablet 50-100 mg  50-100 mg Oral Q4H PRN Elgie Collard, PA-C   100 mg at 10/10/20 2138     Discharge Medications: Please see discharge summary for a list of discharge medications.  Relevant Imaging Results:  Relevant Lab  Results:   Additional Information SSN [id number]  Vinie Sill, LCSWA

## 2020-10-12 NOTE — Progress Notes (Signed)
Mobility Specialist - Progress Note   10/12/20 1146  Mobility  Activity Ambulated in hall  Level of Assistance Standby assist, set-up cues, supervision of patient - no hands on  Assistive Device Front wheel walker  Distance Ambulated (ft) 160 ft  Mobility Response Tolerated well  Mobility performed by Mobility specialist  $Mobility charge 1 Mobility   Pre-mobility: 83 HR During mobility: 124 HR Post-mobility: 84 HR  Pt took several standing rest breaks during ambulation in order to catch his breath. Pt sitting up in recliner after walk.   Pricilla Handler Mobility Specialist Mobility Specialist Phone: [phone]

## 2020-10-13 ENCOUNTER — Inpatient Hospital Stay (HOSPITAL_COMMUNITY): Payer: Medicare Other

## 2020-10-13 MED ORDER — LOSARTAN POTASSIUM 50 MG PO TABS
50.0000 mg | ORAL_TABLET | Freq: Every day | ORAL | Status: DC
Start: 2020-10-13 — End: 2020-10-14
  Administered 2020-10-13: 50 mg via ORAL
  Filled 2020-10-13 (×2): qty 1

## 2020-10-13 MED ORDER — AMIODARONE HCL IN DEXTROSE 360-4.14 MG/200ML-% IV SOLN
30.0000 mg/h | INTRAVENOUS | Status: DC
  Administered 2020-10-13: 30 mg/h via INTRAVENOUS

## 2020-10-13 MED ORDER — AMIODARONE LOAD VIA INFUSION
150.0000 mg | Freq: Once | INTRAVENOUS | Status: AC
  Administered 2020-10-13: 150 mg via INTRAVENOUS
  Filled 2020-10-13: qty 83.34

## 2020-10-13 MED ORDER — AMIODARONE HCL IN DEXTROSE 360-4.14 MG/200ML-% IV SOLN
60.0000 mg/h | INTRAVENOUS | Status: DC
  Administered 2020-10-13: 60 mg/h via INTRAVENOUS
  Filled 2020-10-13 (×2): qty 200

## 2020-10-13 MED ORDER — METOPROLOL TARTRATE 25 MG PO TABS
25.0000 mg | ORAL_TABLET | Freq: Two times a day (BID) | ORAL | Status: DC
  Administered 2020-10-13 – 2020-10-15 (×5): 25 mg via ORAL
  Filled 2020-10-13 (×5): qty 1

## 2020-10-13 MED ORDER — ASPIRIN 325 MG PO TBEC: 325.0000 mg | DELAYED_RELEASE_TABLET | Freq: Every day | ORAL | 0 refills | Status: DC

## 2020-10-13 NOTE — Progress Notes (Signed)
Physical Therapy Treatment Patient Details Name: Andrew Richardson MRN: [id number] DOB: 05/10/1941 Today's Date: 10/13/2020    History of Present Illness Patient underwnet CABGx4 on 10/08/2020. PMH:CAD. adenoma of collon, pre-diabeties, hyperlipidemia, HTN, depression    PT Comments    Patient received sitting up in recliner. Reports he is feeling ok. Not as good as yesterday. Agrees to PT session. Patient ambulated 180 feet with RW and min guard. Slow steady pace. 2-3 brief standing rest breaks due to SOB and fatigue. Patient will continue to benefit from skilled PT while here to improve activity tolerance and safety with mobility.     Follow Up Recommendations  SNF     Equipment Recommendations  Rolling walker with 5" wheels    Recommendations for Other Services       Precautions / Restrictions Precautions Precautions: None Restrictions Weight Bearing Restrictions: No Other Position/Activity Restrictions: Sternal percuations    Mobility  Bed Mobility               General bed mobility comments: patient received up in recliner.  Transfers Overall transfer level: Needs assistance Equipment used: Rolling walker (2 wheeled) Transfers: Sit to/from Stand Sit to Stand: Min guard         General transfer comment: cuing for sternal percuations  Ambulation/Gait Ambulation/Gait assistance: Min guard Gait Distance (Feet): 180 Feet Assistive device: Rolling walker (2 wheeled) Gait Pattern/deviations: Step-through pattern;Trunk flexed Gait velocity: decreased   General Gait Details: Patient requires 2-3 brief standing rest breaks due to sob/fatigue.   Stairs             Wheelchair Mobility    Modified Rankin (Stroke Patients Only)       Balance Overall balance assessment: Needs assistance Sitting-balance support: Feet supported Sitting balance-Leahy Scale: Good     Standing balance support: Bilateral upper extremity supported;During functional  activity Standing balance-Leahy Scale: Fair Standing balance comment: reliant on RW and min guard                            Cognition Arousal/Alertness: Awake/alert Behavior During Therapy: WFL for tasks assessed/performed Overall Cognitive Status: Within Functional Limits for tasks assessed                                        Exercises      General Comments        Pertinent Vitals/Pain Pain Assessment: Faces Faces Pain Scale: Hurts a little bit Pain Location: Chest with coughing. Pain Descriptors / Indicators: Sore Pain Intervention(s): Monitored during session    Home Living                      Prior Function            PT Goals (current goals can now be found in the care plan section) Acute Rehab PT Goals Patient Stated Goal: to get stronger PT Goal Formulation: With patient Time For Goal Achievement: 10/18/20 Potential to Achieve Goals: Good Progress towards PT goals: Progressing toward goals    Frequency    Min 2X/week      PT Plan Current plan remains appropriate    Co-evaluation              AM-PAC PT "6 Clicks" Mobility   Outcome Measure  Help needed turning from your back to your side  while in a flat bed without using bedrails?: A Little Help needed moving from lying on your back to sitting on the side of a flat bed without using bedrails?: A Little Help needed moving to and from a bed to a chair (including a wheelchair)?: A Little Help needed standing up from a chair using your arms (e.g., wheelchair or bedside chair)?: A Little Help needed to walk in hospital room?: A Little Help needed climbing 3-5 steps with a railing? : A Lot 6 Click Score: 17    End of Session Equipment Utilized During Treatment: Gait belt Activity Tolerance: Patient tolerated treatment well Patient left: Other (comment) (patient requested to sit in bathroom for a bit at end of session. NT notified.) Nurse Communication:  Mobility status PT Visit Diagnosis: Other abnormalities of gait and mobility (R26.89)     Time: 4259-5638 PT Time Calculation (min) (ACUTE ONLY): 17 min  Charges:  $Gait Training: 8-22 mins                     [employer], PT, GCS 10/13/20,11:23 AM

## 2020-10-13 NOTE — TOC Initial Note (Signed)
Transition of Care [facility]) - Initial/Assessment Note    Patient Details  Name: Andrew Richardson MRN: [id number] Date of Birth: 08/22/1941  Transition [facility] Care [facility]) CM/SW Contact:    Vinie Sill, [place] Phone Number: 10/13/2020, 4:41 PM  Clinical Narrative:                  CSW visit with patient at bedside. CSW introduced self and explained role. CSW discussed with patient PT recommendation of short term rehab at [facility]. Patient states he and his wife lives alone in the home. Patient is agreeable to rehab at [facility]. CSW explained the SNF process. Patient requested SNF near their home and was agreeable to sending referrals to surrounding counties ar back [place] (because of limited SNFs in the [place] area). Patient states he has received Pfizer covid vaccines and booster shot. CSW was given permission to call his wife. Patient states no questions or concerns at this time.  CSW spoke with patient's wife,Dana. CSW introduced self and explained role. CSW informed Hinton Dyer of PT recommendation and that patient was agreeable to recommendation. She expressed she was glad patient was agreeable and preferred [facility]. CSW explained the SNF process and advised will send referrals today.   CSW sent referral and message to [facility] -waiting on response CSW will provide bed offers once available CSW will continue to follow and assist with discharge planning.  Thurmond Butts, MSW, LCSW Clinical Social Worker     Expected Discharge Plan: Skilled Nursing Facility Barriers to Discharge: Continued Medical Work up,SNF Pending bed offer   Patient Goals and CMS Choice        Expected Discharge Plan and Services Expected Discharge Plan: [place] In-house Referral: Clinical Social Work                                            Prior Living Arrangements/Services   Lives with:: Self,Spouse Patient language and need for interpreter reviewed:: No        Need for  Family Participation in Patient Care: Yes (Comment) Care giver support system in place?: Yes (comment)   Criminal Activity/Legal Involvement Pertinent to Current Situation/Hospitalization: No - Comment as needed  Activities of Daily Living Home Assistive Devices/Equipment: None ADL Screening (condition at time of admission) Patient's cognitive ability adequate to safely complete daily activities?: Yes Is the patient deaf or have difficulty hearing?: No Does the patient have difficulty seeing, even when wearing glasses/contacts?: No Does the patient have difficulty concentrating, remembering, or making decisions?: No Patient able to express need for assistance with ADLs?: Yes Does the patient have difficulty dressing or bathing?: No Independently performs ADLs?: Yes (appropriate for developmental age) Does the patient have difficulty walking or climbing stairs?: No Weakness of Legs: None Weakness of Arms/Hands: None  Permission Sought/Granted Permission sought to share information with : Family Supports Permission granted to share information with : Yes, Verbal Permission Granted  Share Information with NAME: Beniah Magnan  Permission granted to share info w AGENCY: SNFs  Permission granted to share info w Relationship: spouse  Permission granted to share info w Contact Information: [phone]  Emotional Assessment Appearance:: Appears stated age Attitude/Demeanor/Rapport: Engaged Affect (typically observed): Accepting,Calm,Appropriate Orientation: : Oriented to Self,Oriented to Place,Oriented to  Time,Oriented to Situation Alcohol / Substance Use: Not Applicable Psych Involvement: No (comment)  Admission diagnosis:  NSTEMI (non-ST elevated  myocardial infarction) ([place]) [I21.4] 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 NSTEMI (non-ST elevated myocardial infarction) ([place]) 10/05/2020  . CAD S/P percutaneous coronary  angioplasty 10/05/2020  . HTN (hypertension) 10/05/2020  . HLD (hyperlipidemia) 10/05/2020   PCP:  Derinda Late, MD Pharmacy:   [facility] [phone] - [address], [place] AT [address] [address] Phone: [phone] Fax: [phone]  [facility] [place] [place] - [address] [place] [address] Phone: [phone] Fax: [phone]     Social Determinants of Health (SDOH) Interventions    Readmission Risk Interventions No flowsheet data found.

## 2020-10-13 NOTE — Progress Notes (Addendum)
      [address]       [place] [address]             [phone]        5 Days Post-Op Procedure(s) (LRB): CORONARY ARTERY BYPASS GRAFTING TIMES  FOUR  WITH LEFT INTERNAL MAMMARY ARTERY AND ENDOSCOPIC HARVEST OF BILATERAL  LEG SAPHENOUS VEIN (N/A) TRANSESOPHAGEAL ECHOCARDIOGRAM (TEE) (N/A)  Subjective: Patient sitting in the chair this am. He has a cough, which he states he had prior to surgery from many years of "lung damage."  Objective: Vital signs in last 24 hours: Temp:  [98 F (36.7 C)-98.4 F (36.9 C)] 98.3 F (36.8 C) (01/17 0400) Pulse Rate:  [72-98] 72 (01/17 0400) Cardiac Rhythm: Atrial fibrillation (01/17 0250) Resp:  [16-20] 16 (01/17 0400) BP: (121-152)/(63-81) 152/81 (01/17 0400) SpO2:  [96 %-100 %] 97 % (01/17 0400) Weight:  [93.3 kg] 93.3 kg (01/17 0500)  Pre op weight 86.1 kg Current Weight  10/13/20 93.3 kg       Intake/Output from previous day: 01/16 0701 - 01/17 0700 In: 240 [P.O.:240] Out: 400 [Urine:400]   Physical Exam:  Cardiovascular: RRR Pulmonary: Slightly diminished bibasilar breath sounds Abdomen: Soft, non tender, bowel sounds present. Extremities: Bilateral lower extremity edema. Wounds: Clean and dry.  No erythema or signs of infection. Minor sero sanguinous drainage stab wound left leg  Lab Results: CBC: Recent Labs    10/11/20 0224 10/12/20 0149  WBC 8.4 4.9  HGB 9.4* 8.7*  HCT 28.8* 25.0*  PLT 155 184   BMET:  Recent Labs    10/11/20 0224 10/12/20 0149  NA 131* 132*  K 4.5 4.4  CL 98 97*  CO2 24 26  GLUCOSE 108* 112*  BUN 24* 21  CREATININE 0.86 0.85  CALCIUM 8.3* 8.2*    PT/INR:  Lab Results  Component Value Date   INR 1.4 (H) 10/08/2020   INR 1.0 10/05/2020   ABG:  INR: Will add last result for INR, ABG once components are confirmed Will add last 4 CBG results once components are confirmed  Assessment/Plan:  1. CV - SR. He is having a lot of PVCs. On Lopressor 12.5 mg bid  and Losartan 25 mg daily. Hypertensive so will increase Losartan for better BP control and Lopressor. 2.  Pulmonary - On room air. Check PA/LAT CXR. Encourage incentive spirometer. 3. Volume Overload - On Lasix 40 mg daily 4.  Expected post op acute blood loss anemia - H and H yesterday decreased to 8.7 and 25. 5. Hypothyroidism-continue Levothyroxine 50 mcg daily. 6. To SNF once bed available  Donielle M ZimmermanPA-C 10/13/2020,7:43 AM   I have seen and examined the patient and agree with the assessment and plan as outlined.  Rexene Alberts, MD 10/13/2020 10:35 AM

## 2020-10-13 NOTE — Progress Notes (Signed)
CARDIAC REHAB PHASE I   Came to offer to walk with pt. Pt assisted out of BR, states he recently returned from a walk. Pt returned to recliner, call bell and bedside table within reach. Will f/u to encourage ambulation as able.  Rufina Falco, RN BSN 10/13/2020 11:22 AM

## 2020-10-13 NOTE — Progress Notes (Signed)
CARDIAC REHAB PHASE I   Returned to offer to walk with pt. Pt c/o not feeling well, requesting time for rest. Encouraged ambulation later today as able. Will continue to follow.  Rufina Falco, RN BSN 10/13/2020 3:04 PM

## 2020-10-13 NOTE — Discharge Summary (Incomplete)
Physician Discharge Summary       [address]       [place] [address]             [phone]    Patient ID: Andrew Richardson MRN: [id number] DOB/AGE: 1940/12/28 80 y.o.  Admit date: 10/06/2020 Discharge date: 10/15/2020  Admission Diagnoses: 1.  S/p NSTEMI (non-ST elevated myocardial infarction) ([place]) 2. Coronary artery disease  Discharge Diagnoses:  1. S/P CABG x 4 2. Expected post op blood loss anemia 3. Post op atrial fibrillation 4. History of the following: Depression    . Headache   . Hyperlipemia   . Hypertension   . Hypothyroid   . Prediabetes   . 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 Tubular adenoma of colon     Consults: None  Procedure (s):  CABG X 4.  LIMA LAD, RSVG PDA, OM1, and D1   Endoscopic greater saphenous vein harvest on the right and left by Dr. Kipp Brood on 10/08/2020.  History of Presenting Illness: This is an 80 year old male transferred from [facility] for evaluation of three-vessel coronary disease.  He originally presented with an NSTEMI after having several days of anginal symptoms 1 of which woke him up from sleep.  He then underwent a left heart cath which identified the disease.  He does endorse exertional dyspnea, and chest pressure with ambulation occasionally.  He is relatively active prior to developing symptoms in that he works in his yard.  Dr. Kipp Brood discussed the need for coronary artery bypass grafting surgery. Potential risks, benefits, and complications of the surgery were discussed with the patient and he agreed to proceed with surgery. Pre operative carotid duplex US showed no significant internal carotid artery stenosis bilaterally. Patient underwent a CABG x 4 on 10/08/2020.  Brief Hospital Course:  As dictated by Nicholes Rough PA-C: Andrew Richardson is an 80 year old man who underwent a CABG x 4 on 10/08/2020 with Dr. Kipp Brood. He tolerated the procedure well and  was transferred to the surgical ICU for continued care. He was extubated in a timely manner. POD 1 we removed his EPW, his chest tubes remained. He remained afebrile and we were advancing his diet. He had good urine output. We continued his ICU care at this time. We encouraged pulmonary toilet for weaning of oxygen support.   Addendum: Chest tubes were removed once output decreased. He remained afebrile and hemodynamically stable. He was started on Lopressor and this was titrated accordingly. He was volume overloaded and diuresed. He was weaned off the Insulin drip. His pre op HGA1C is 5.7. He likely has pre diabetes. Nutrition information will be provided with his discharge paperwork. He will need further surveillance of his HGA1C by his medical doctor after discharge.  Low dose Losartan was restarted for better control of blood pressure. HE did have expected post op blood loss anemia. His H and H 01/16 was 8.7 and 25.He was felt surgically stable for transfer from the ICU to 4E for further convalescence on 01/14. He apparently had an anxiety attack and was given Xanax, which helped. He has a history of hypothyroidism. He was restarted on Levothyroxine as taken prior to surgery. He remained in sinus rhythm, had a lot of PVCs, and was hypertensive. Lopressor and Losartan were increased to 25 mg bid and 50 mg daily respectively. He has been tolerating a diet and has had bowel movements. He is ambulating on room air. He went into a  fib with CVR the afternoon of 01/17 and was put on Amiodarone drip. He did have complaints of dizziness and nausea. He converted to sinus rhythm and remained in sinus rhythm. He was transitioned to oral Amiodarone. He was still hypertensive so Losartan was titrated to his pre op dose of 100 mg daily. Because he was still hypertensive, Amlodipine was restarted.All wounds are clean, dry, and healing without signs of infection. As discussed with Dr. Kipp Brood, patient is felt surgically  stable for discharge to SNF.   Latest Vital Signs: Blood pressure (!) 152/90, pulse 78, temperature 97.8 F (36.6 C), temperature source Oral, resp. rate 20, height 5\' 8"  (1.727 m), weight 92.7 kg, SpO2 100 %.  Physical Exam: Cardiovascular: RRR Pulmonary: Slightly diminished bibasilar breath sounds Abdomen: Soft, non tender, bowel sounds present. Extremities: Mild bilateral lower extremity edema. Wounds: Clean and dry.  No erythema or signs of infection.   Discharge Condition:Stable and discharged to home.  Recent laboratory studies:  Lab Results  Component Value Date   WBC 4.9 10/12/2020   HGB 8.7 (L) 10/12/2020   HCT 25.0 (L) 10/12/2020   MCV 101.6 (H) 10/12/2020   PLT 184 10/12/2020   Lab Results  Component Value Date   NA 132 (L) 10/12/2020   K 4.4 10/12/2020   CL 97 (L) 10/12/2020   CO2 26 10/12/2020   CREATININE 0.85 10/12/2020   GLUCOSE 112 (H) 10/12/2020      Diagnostic Studies: DG Chest 2 View  Result Date: 10/13/2020 CLINICAL DATA:  Pleural effusion EXAM: CHEST - 2 VIEW COMPARISON:  October 10, 2020 FINDINGS: There are small pleural effusions bilaterally. There is no edema or airspace opacity. Heart is upper normal in size with pulmonary vascularity normal. Patient is status post coronary artery bypass grafting. There is aortic atherosclerosis. No adenopathy. No bone lesions. No pneumothorax. IMPRESSION: Small pleural effusion on each side. No edema or airspace opacity. Heart upper normal in size. Status post coronary artery bypass grafting. Aortic Atherosclerosis (ICD10-I70.0). Electronically Signed   By: Lowella Grip III M.D.   On: 10/13/2020 08:28   CARDIAC CATHETERIZATION  Result Date: 10/06/2020  2nd Mrg lesion is 80% stenosed.  1st Mrg lesion is 50% stenosed.  Mid LAD lesion is 80% stenosed.  Prox LAD to Mid LAD lesion is 70% stenosed.  1st Diag lesion is 80% stenosed.  Mid RCA lesion is 75% stenosed.  Dist RCA lesion is 50% stenosed.  1.  Non-ST  elevation myocardial infarction 2.  Three-vessel coronary artery disease 3.  Mildly reduced left ventricular function Recommendations 1.  Transfer to [facility] for [facility]G   DG Chest Port 1 View  Result Date: 10/10/2020 CLINICAL DATA:  Chest tube, pleural effusion EXAM: PORTABLE CHEST 1 VIEW COMPARISON:  Portable exam 0601 hours compared to 10/09/2020 FINDINGS: LEFT thoracostomy tube and mediastinal drain unchanged. RIGHT jugular line with tip projecting over SVC. Normal heart size post CABG. Bibasilar atelectasis without infiltrate or pneumothorax. Small bibasilar effusions. IMPRESSION: Persistent bibasilar atelectasis and small pleural effusions. Electronically Signed   By: Lavonia Dana M.D.   On: 10/10/2020 08:24   DG Chest Port 1 View  Result Date: 10/09/2020 CLINICAL DATA:  Post open heart surgery, chest soreness EXAM: PORTABLE CHEST 1 VIEW COMPARISON:  Radiograph 10/08/2020 FINDINGS: *Right IJ catheter terminates at the superior cavoatrial junction. *Removal of the previously seen endotracheal tube. *Mediastinal and left basilar pleural drains remain in place. *Postsurgical changes from sternotomy and CABG. *Telemetry leads and external support devices overlie  the chest. Some new bandlike opacity in left mid lung is likely subsegmental/platelike atelectasis with additional hazy atelectatic changes in the lung bases. Likely small layering left and trace right pleural effusions. No pneumothorax is seen. Cardiomediastinal contours are similar to prior counting for differences in technique. No other acute osseous or soft tissue abnormalities. IMPRESSION: 1. Interval extubation. Remaining lines and tubes as detailed above. 2. New bandlike opacity in the left mid lung, likely subsegmental/platelike atelectasis with additional hazy atelectatic changes in the lung bases. 3. Some layering left pleural effusion with trace right effusion as well. 4. Stable cardiomediastinal contours. Electronically Signed    By: Lovena Le M.D.   On: 10/09/2020 06:39   DG Chest Port 1 View  Result Date: 10/08/2020 CLINICAL DATA:  Postoperative day 0 status post CABG EXAM: PORTABLE CHEST 1 VIEW COMPARISON:  10/05/2020 FINDINGS: Endotracheal tube tip 3.3 cm above the carina. Right internal jugular central venous catheter tip: SVC. Nasogastric tube terminates in the stomach body. Epicardial pacer lead wires are noted along with tubing projecting over the chest likely representing chest tubes and possibly mediastinal drain. Normal alignment of sternotomy wires. Subsegmental atelectasis at the left lung base. Mildly low lung volumes. The lungs appear otherwise clear. No blunting of the costophrenic angles. Heart size within normal limits given the low lung volumes and AP projection. No active pulmonary edema. IMPRESSION: 1. Postop day 0 status post CABG, with tubes and lines satisfactorily positioned. 2. Subsegmental atelectasis at the left lung base along with low lung volumes noted. Electronically Signed   By: Van Clines M.D.   On: 10/08/2020 15:01   DG Chest Portable 1 View  Result Date: 10/05/2020 CLINICAL DATA:  Chest pain. EXAM: PORTABLE CHEST 1 VIEW COMPARISON:  None. FINDINGS: The heart size and mediastinal contours are within normal limits. Both lungs are clear. The visualized skeletal structures are unremarkable. IMPRESSION: No active disease. Electronically Signed   By: Dorise Bullion III M.D   On: 10/05/2020 18:15   ECHOCARDIOGRAM COMPLETE  Result Date: 10/07/2020    ECHOCARDIOGRAM REPORT   Patient Name:   Charna Busman Date of Exam: 10/07/2020 Medical Rec #:  [id number]         Height:       68.0 in Accession #:    [id number]        Weight:       192.2 lb Date of Birth:  Oct 31, 1940          BSA:          2.009 m Patient Age:    79 years          BP:           139/83 mmHg Patient Gender: M                 HR:           66 bpm. Exam Location:  Inpatient Procedure: 2D Echo, Cardiac Doppler and Color Doppler  Indications:    CAD Native Vessel I25.10  History:        Patient has no prior history of Echocardiogram examinations.                 CAD; Risk Factors:Hypertension, Dyslipidemia and Former Smoker.  Sonographer:    Vickie Epley RDCS Referring Phys: [place]  1. Left ventricular ejection fraction, by estimation, is 55%. The left ventricle has normal function. The left ventricle has no regional wall motion abnormalities. Left  ventricular diastolic parameters are consistent with Grade I diastolic dysfunction (impaired relaxation).  2. Right ventricular systolic function is normal. The right ventricular size is normal. Tricuspid regurgitation signal is inadequate for assessing PA pressure.  3. The mitral valve is normal in structure. No evidence of mitral valve regurgitation. No evidence of mitral stenosis.  4. The aortic valve is tricuspid. Aortic valve regurgitation is not visualized. No aortic stenosis is present.  5. The inferior vena cava is normal in size with greater than 50% respiratory variability, suggesting right atrial pressure of 3 mmHg. FINDINGS  Left Ventricle: Left ventricular ejection fraction, by estimation, is 55%. The left ventricle has normal function. The left ventricle has no regional wall motion abnormalities. The left ventricular internal cavity size was normal in size. There is no left ventricular hypertrophy. Left ventricular diastolic parameters are consistent with Grade I diastolic dysfunction (impaired relaxation). Right Ventricle: The right ventricular size is normal. No increase in right ventricular wall thickness. Right ventricular systolic function is normal. Tricuspid regurgitation signal is inadequate for assessing PA pressure. Left Atrium: Left atrial size was normal in size. Right Atrium: Right atrial size was normal in size. Pericardium: There is no evidence of pericardial effusion. Mitral Valve: The mitral valve is normal in structure. No evidence  of mitral valve regurgitation. No evidence of mitral valve stenosis. Tricuspid Valve: The tricuspid valve is normal in structure. Tricuspid valve regurgitation is trivial. Aortic Valve: The aortic valve is tricuspid. Aortic valve regurgitation is not visualized. No aortic stenosis is present. Pulmonic Valve: The pulmonic valve was normal in structure. Pulmonic valve regurgitation is not visualized. Aorta: The aortic root is normal in size and structure. Venous: The inferior vena cava is normal in size with greater than 50% respiratory variability, suggesting right atrial pressure of 3 mmHg. IAS/Shunts: No atrial level shunt detected by color flow Doppler.  LEFT VENTRICLE PLAX 2D LVIDd:         4.60 cm      Diastology LVIDs:         3.70 cm      LV e' medial:    5.02 cm/s LV PW:         0.90 cm      LV E/e' medial:  10.0 LV IVS:        0.90 cm      LV e' lateral:   8.18 cm/s LVOT diam:     2.10 cm      LV E/e' lateral: 6.1 LV SV:         63 LV SV Index:   31 LVOT Area:     3.46 cm  LV Volumes (MOD) LV vol d, MOD A2C: 111.0 ml LV vol d, MOD A4C: 100.0 ml LV vol s, MOD A2C: 46.7 ml LV vol s, MOD A4C: 52.2 ml LV SV MOD A2C:     64.3 ml LV SV MOD A4C:     100.0 ml LV SV MOD BP:      59.2 ml RIGHT VENTRICLE RV S prime:     10.90 cm/s TAPSE (M-mode): 2.0 cm LEFT ATRIUM             Index       RIGHT ATRIUM           Index LA diam:        3.50 cm 1.74 cm/m  RA Area:     12.70 cm LA Vol (A2C):   42.7 ml 21.25 ml/m RA Volume:   27.70  ml  13.78 ml/m LA Vol (A4C):   31.2 ml 15.53 ml/m LA Biplane Vol: 37.1 ml 18.46 ml/m  AORTIC VALVE LVOT Vmax:   87.70 cm/s LVOT Vmean:  57.400 cm/s LVOT VTI:    0.181 m  AORTA Ao Root diam: 3.40 cm MITRAL VALVE MV Area (PHT): 2.48 cm    SHUNTS MV Decel Time: 306 msec    Systemic VTI:  0.18 m MV E velocity: 50.00 cm/s  Systemic Diam: 2.10 cm MV A velocity: 78.20 cm/s MV E/A ratio:  0.64 Loralie Champagne MD Electronically signed by Loralie Champagne MD Signature Date/Time: 10/07/2020/4:02:16 PM     Final    ECHO INTRAOPERATIVE TEE  Result Date: 10/08/2020  *INTRAOPERATIVE TRANSESOPHAGEAL REPORT *  Patient Name:   Charna Busman Date of Exam: 10/08/2020 Medical Rec #:  [id number]         Height:       68.0 in Accession #:    [id number]        Weight:       189.9 lb Date of Birth:  August 04, 1941          BSA:          2.00 m Patient Age:    82 years          BP:           120/84 mmHg Patient Gender: M                 HR:           54 bpm. Exam Location:  Anesthesiology Transesophogeal exam was perform intraoperatively during surgical procedure. Patient was closely monitored under general anesthesia during the entirety of examination. Indications:     Coronary Artery Disease Sonographer:     Bernadene Person RDCS Performing Phys: [id number] Lucile Crater LIGHTFOOT Diagnosing Phys: Annye Asa MD Complications: No known complications during this procedure. POST-OP IMPRESSIONS Limited post CPB exam: - Left Ventricle: The left ventricle shows some recovery of contractility in the inferoseptal region. Overall contractility appears normal. EF is essentially unchanged from pre-bypass images, 50-55%. - Aortic Valve: The aortic valve appears unchanged from pre-bypass images. There is no aortic insufficiency. - Mitral Valve: The mitral valve function appears essentially unchanged from pre-bypass images. There is mild Mitral regurgitation. - Tricuspid Valve: The tricuspid valve appears unchanged from pre-bypass images. PRE-OP FINDINGS  Left Ventricle: The left ventricle has low normal systolic function, with an ejection fraction of 50-55%, calculated 54%. There is moderate-severe hypokinesis of the inferoseptal wall. The cavity size was normal. There is no increase in left ventricular  wall thickness. Right Ventricle: The right ventricle has normal systolic function. The cavity was normal. There is no increase in right ventricular wall thickness. Right ventricular systolic pressure is normal. Left Atrium: Left atrial size was  normal in size. The left atrial appendage is well visualized and there is no evidence of thrombus present. Left atrial appendage velocity is normal at greater than 40 cm/s. Right Atrium: Right atrial size was normal in size. Interatrial Septum: No atrial level shunt detected by color flow Doppler. Pericardium: There is no evidence of pericardial effusion. Mitral Valve: The mitral valve is normal in structure. Mild thickening of the mitral valve leaflets. No calcification of the mitral valve leaflets. Mitral valve regurgitation is not visualized by color flow Doppler. There is no evidence of mitral valve vegetation. Pulmonary venous flow is normal. There is no evidence of mitral stenosis, with peak gradient 2 mmHg, mean gradient  1 mmHg. Tricuspid Valve: The tricuspid valve was normal in structure. Tricuspid valve regurgitation is trivial by color flow Doppler. There is no evidence of tricuspid valve vegetation. Aortic Valve: The aortic valve is tricuspid. Aortic valve regurgitation was not visualized by color flow Doppler. There is no evidence of aortic valve stenosis, with peak gradient 7 mmHg, mean gradient 3 mmHg. There is mild aortic annular calcification noted. There is no evidence of a vegetation on the aortic valve. Pulmonic Valve: The pulmonic valve was normal in structure, with normal leaflet excursion. No evidence of pulmonic stenosis. Pulmonic valve regurgitation is not visualized by color flow Doppler. Aorta: There is evidence of layered plaque in the aortic arch and throughout the descending aorta; Grade II, measuring 2-84mm in size. Pulmonary Artery: The pulmonary artery is of normal size. Venous: The inferior vena cava is normal in size with greater than 50% respiratory variability, suggesting right atrial pressure of 3 mmHg. +-------------+--------++ AORTIC VALVE          +-------------+--------++ AV Mean Grad:3.0 mmHg +-------------+--------++ +-------------+--------++ MITRAL VALVE           +-------------+--------++ MV Mean grad:1.0 mmHg +-------------+--------++  Annye Asa MD Electronically signed by Annye Asa MD Signature Date/Time: 10/08/2020/4:11:47 PM    Final    VAS US DOPPLER PRE CABG  Result Date: 10/07/2020 PREOPERATIVE VASCULAR EVALUATION  Indications:  Pre-CABG. Risk Factors: Hypertension, hyperlipidemia, coronary artery disease. Performing Technologist: Abram Sander RVS  Examination Guidelines: A complete evaluation includes B-mode imaging, spectral Doppler, color Doppler, and power Doppler as needed of all accessible portions of each vessel. Bilateral testing is considered an integral part of a complete examination. Limited examinations for reoccurring indications may be performed as noted.  Right Carotid Findings: +----------+--------+--------+--------+------------+--------+           PSV cm/sEDV cm/sStenosisDescribe    Comments +----------+--------+--------+--------+------------+--------+ CCA Prox  75      12              heterogenous         +----------+--------+--------+--------+------------+--------+ CCA Distal83      21              heterogenous         +----------+--------+--------+--------+------------+--------+ ICA Prox  100     21      1-39%   heterogenous         +----------+--------+--------+--------+------------+--------+ ICA Distal56      14                                   +----------+--------+--------+--------+------------+--------+ ECA       232                                          +----------+--------+--------+--------+------------+--------+ Portions of this table do not appear on this page. +----------+--------+-------+--------+------------+           PSV cm/sEDV cmsDescribeArm Pressure +----------+--------+-------+--------+------------+ Subclavian99                                  +----------+--------+-------+--------+------------+ +---------+--------+--+--------+-+---------+  VertebralPSV cm/s29EDV cm/s7Antegrade +---------+--------+--+--------+-+---------+ Left Carotid Findings: +----------+--------+--------+--------+------------+--------+           PSV cm/sEDV cm/sStenosisDescribe    Comments +----------+--------+--------+--------+------------+--------+ CCA Prox  116     17  heterogenous         +----------+--------+--------+--------+------------+--------+ CCA Distal72      12              heterogenous         +----------+--------+--------+--------+------------+--------+ ICA Prox  67      26      1-39%   heterogenous         +----------+--------+--------+--------+------------+--------+ ICA Distal70      24                                   +----------+--------+--------+--------+------------+--------+ ECA       114                                          +----------+--------+--------+--------+------------+--------+ +----------+--------+--------+--------+------------+ SubclavianPSV cm/sEDV cm/sDescribeArm Pressure +----------+--------+--------+--------+------------+           68                                   +----------+--------+--------+--------+------------+ +---------+--------+--+--------+--+---------+ VertebralPSV cm/s44EDV cm/s17Antegrade +---------+--------+--+--------+--+---------+  ABI Findings: +--------+------------------+-----+---------+--------+ Right   Rt Pressure (mmHg)IndexWaveform Comment  +--------+------------------+-----+---------+--------+ Brachial                       triphasic         +--------+------------------+-----+---------+--------+ ATA                            biphasic          +--------+------------------+-----+---------+--------+ PTA                            biphasic          +--------+------------------+-----+---------+--------+ +--------+------------------+-----+---------+-------+ Left    Lt Pressure (mmHg)IndexWaveform Comment  +--------+------------------+-----+---------+-------+ [id number]                    triphasic        +--------+------------------+-----+---------+-------+ PTA                            triphasic        +--------+------------------+-----+---------+-------+ DP                             triphasic        +--------+------------------+-----+---------+-------+  Right Doppler Findings: +--------+--------+-----+---------+--------+ Site    PressureIndexDoppler  Comments +--------+--------+-----+---------+--------+ Brachial             triphasic         +--------+--------+-----+---------+--------+ Radial               triphasic         +--------+--------+-----+---------+--------+ Ulnar                triphasic         +--------+--------+-----+---------+--------+  Left Doppler Findings: +--------+--------+-----+---------+--------+ Site    PressureIndexDoppler  Comments +--------+--------+-----+---------+--------+ [id number]          triphasic         +--------+--------+-----+---------+--------+ Radial               triphasic         +--------+--------+-----+---------+--------+  Ulnar                triphasic         +--------+--------+-----+---------+--------+  Summary: Right Carotid: Velocities in the right ICA are consistent with a 1-39% stenosis. Left Carotid: Velocities in the left ICA are consistent with a 1-39% stenosis. Vertebrals: Bilateral vertebral arteries demonstrate antegrade flow. Right Upper Extremity: Doppler waveforms remain within normal limits with right radial compression. Doppler waveforms remain within normal limits with right ulnar compression. Left Upper Extremity: Doppler waveforms remain within normal limits with left radial compression. Doppler waveforms remain within normal limits with left ulnar compression.  Electronically signed by Deitra Mayo MD on 10/07/2020 at 2:20:19 PM.    Final      Discharge  Medications: Allergies as of 10/15/2020      Reactions   Simvastatin Other (See Comments)   Fatigue and muscle pain/weakness   Tetracyclines & Related Diarrhea   Nitrofurantoin Rash      Medication List    STOP taking these medications   hydrochlorothiazide 25 MG tablet Commonly known as: HYDRODIURIL     TAKE these medications   amiodarone 200 MG tablet Commonly known as: PACERONE Take 1 tablet (200 mg total) by mouth 2 (two) times daily. For 10 days;then take 200 mg daily thereafter   amLODipine 5 MG tablet Commonly known as: NORVASC Take 5 mg by mouth every evening.   aspirin 325 MG EC tablet Take 1 tablet (325 mg total) by mouth daily.   atorvastatin 10 MG tablet Commonly known as: LIPITOR Take 10 mg by mouth every evening.   furosemide 40 MG tablet Commonly known as: LASIX Take 1 tablet (40 mg total) by mouth daily. For 5 days then stop.   levothyroxine 50 MCG tablet Commonly known as: SYNTHROID Take 50 mcg by mouth daily.   losartan 100 MG tablet Commonly known as: COZAAR Take 100 mg by mouth every evening.   metoprolol tartrate 25 MG tablet Commonly known as: LOPRESSOR Take 1 tablet (25 mg total) by mouth 2 (two) times daily. What changed:   medication strength  how much to take  Another medication with the same name was removed. Continue taking this medication, and follow the directions you see here.   polyvinyl alcohol 1.4 % ophthalmic solution Commonly known as: LIQUIFILM TEARS Place 1 drop into both eyes daily as needed for dry eyes.   potassium chloride SA 20 MEQ tablet Commonly known as: KLOR-CON Take 1 tablet (20 mEq total) by mouth daily. For 5 days then stop.   tamsulosin 0.4 MG Caps capsule Commonly known as: FLOMAX Take 0.4 mg by mouth daily.   traMADol 50 MG tablet Commonly known as: ULTRAM Take 1 tablet (50 mg total) by mouth every 6 (six) hours as needed for moderate pain.            Durable Medical Equipment  (From  admission, onward)         Start     Ordered   10/14/20 1241  For home use only DME 3 n 1  Once        10/14/20 1240   10/13/20 1144  For home use only DME Walker rolling  Once       Question Answer Comment  Walker: With 5 Inch Wheels   Patient needs a walker to treat with the following condition Physical deconditioning      10/13/20 1143         The patient has been discharged  on:   1.Beta Blocker:  Yes [ x  ]                              No   [   ]                              If No, reason:  2.Ace Inhibitor/ARB: Yes [ x  ]                                     No  [    ]                                     If No, reason:  3.Statin:   Yes [ x  ]                  No  [   ]                  If No, reason:  4.Ecasa:  Yes  [ x  ]                  No   [   ]                  If No, reason:  Follow Up Appointments:  Follow-up Information    Isaias Cowman, MD. Go on 10/29/2020.   Specialty: Cardiology Why: Appointment time is at 10:15 am Contact information: [facility] [place] [address] [phone]        Lajuana Matte, MD. Go on 10/24/2020.   Specialty: Cardiothoracic Surgery Why: Appointment time is at 10:15 am Contact information: [place] [address] [phone]        Derinda Late, MD. Call.   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 [place] [address] [phone]        [facility], [facility] Follow up.   Why: rolling walker and 3n1 arranged- to be delivered to room prior to discharge Contact information: [place] [address] [phone]               Signed: Sharalyn Ink ZimmermanPA-C 10/15/2020, 10:09 AM

## 2020-10-13 NOTE — Progress Notes (Signed)
Mobility Specialist - Progress Note   10/13/20 1256  Mobility  Activity Refused mobility   Pt states he had a recent episode of lightheadedness. Will f/u as able.   Pricilla Handler Mobility Specialist Mobility Specialist Phone: [phone]

## 2020-10-13 NOTE — Progress Notes (Addendum)
Pt c/o extremely weakness, dizziness and nausea.  Tele showed afib with rate controlled in 70s. Pt denies h/o Afib.  EKG done. VSS. Pt resting in bed.  Paged [place], [place].  Received order for amio bolus and drip.  Idolina Primer, RN

## 2020-10-14 MED ORDER — AMIODARONE HCL 200 MG PO TABS
400.0000 mg | ORAL_TABLET | Freq: Two times a day (BID) | ORAL | Status: DC
  Administered 2020-10-14 – 2020-10-15 (×3): 400 mg via ORAL
  Filled 2020-10-14 (×3): qty 2

## 2020-10-14 MED ORDER — LOSARTAN POTASSIUM 50 MG PO TABS
100.0000 mg | ORAL_TABLET | Freq: Every day | ORAL | Status: DC
  Administered 2020-10-14 – 2020-10-15 (×2): 100 mg via ORAL
  Filled 2020-10-14 (×2): qty 2

## 2020-10-14 NOTE — Progress Notes (Signed)
Mobility Specialist: Progress Note   10/14/20 1601  Mobility  Activity Ambulated in hall  Level of Assistance Minimal assist, patient does 75% or more  Assistive Device Front wheel walker  Distance Ambulated (ft) 270 ft  Mobility Response Tolerated well  Mobility performed by Mobility specialist  Bed Position Chair  $Mobility charge 1 Mobility   Pre-Mobility: 81 HR, 146/77 BP, 98% SpO2 Post-Mobility: 102 HR, 143/78 BP, 100% SpO2  Pt stopped to take one standing rest break halfway through ambulation due to feeling fatigued and SOB. Standing break lasted <30 seconds. Pt back to chair per request.   Harrell Gave Jenel Gierke Mobility Specialist

## 2020-10-14 NOTE — Care Management Important Message (Signed)
Important Message  Patient Details  Name: Andrew Richardson MRN: [id number] Date of Birth: Feb 04, 1941   Medicare Important Message Given:  Yes     [name] 10/14/2020, 11:10 AM

## 2020-10-14 NOTE — Progress Notes (Signed)
CARDIAC REHAB PHASE I   PRE:  Rate/Rhythm: 40 SR  BP:  Sitting: 137/78      SaO2: 97 RA  MODE:  Ambulation: 180 ft   POST:  Rate/Rhythm: 99 SR  BP:  In BR  Pt ambulated 171ft in hallway assist of one with front wheel walker. Pt too two short standing rest breaks c/o back pain and some SOB. Pt coached through purse lipped breathing. Upon return to room, pt requesting BR. Pt in BR, BR Call light within reach. Encouraged continued ambulation and IS use. Will continue to follow.  [id number] Rufina Falco, RN BSN 10/14/2020 9:33 AM
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
## 2020-10-14 NOTE — Progress Notes (Addendum)
° °   °  [address]       [place] [address]             [phone]        6 Days Post-Op Procedure(s) (LRB): CORONARY ARTERY BYPASS GRAFTING TIMES  FOUR  WITH LEFT INTERNAL MAMMARY ARTERY AND ENDOSCOPIC HARVEST OF BILATERAL  LEG SAPHENOUS VEIN (N/A) TRANSESOPHAGEAL ECHOCARDIOGRAM (TEE) (N/A)  Subjective: Patient sitting in the chair this am. He just had a large bowel movement. He has felt dizzy and some nausea this am, around the time of being in a fib and given medication.  Objective: Vital signs in last 24 hours: Temp:  [97.6 F (36.4 C)-98.4 F (36.9 C)] 98.2 F (36.8 C) (01/18 0426) Pulse Rate:  [66-98] 75 (01/18 0426) Cardiac Rhythm: Heart block (01/17 2044) Resp:  [16-18] 18 (01/18 0426) BP: (136-174)/(66-90) 174/88 (01/18 0426) SpO2:  [98 %-100 %] 99 % (01/18 0426)  Pre op weight 86.1 kg Current Weight  10/13/20 93.3 kg       Intake/Output from previous day: 01/17 0701 - 01/18 0700 In: 342.9 [I.V.:342.9] Out: 600 [Urine:600]   Physical Exam:  Cardiovascular: RRR Pulmonary: Slightly diminished bibasilar breath sounds Abdomen: Soft, non tender, bowel sounds present. Extremities: Bilateral lower extremity edema. Wounds: Clean and dry.  No erythema or signs of infection. Minor sero sanguinous drainage stab wound left leg  Lab Results: CBC: Recent Labs    10/12/20 0149  WBC 4.9  HGB 8.7*  HCT 25.0*  PLT 184   BMET:  Recent Labs    10/12/20 0149  NA 132*  K 4.4  CL 97*  CO2 26  GLUCOSE 112*  BUN 21  CREATININE 0.85  CALCIUM 8.2*    PT/INR:  Lab Results  Component Value Date   INR 1.4 (H) 10/08/2020   INR 1.0 10/05/2020   ABG:  INR: Will add last result for INR, ABG once components are confirmed Will add last 4 CBG results once components are confirmed  Assessment/Plan:  1. CV -  He was having a lot of PVCs yesterday so Lopressor increased to 25 mg bid. He went into a fib with CVR yesterday after 4 pm.. On  Amiodarone drip and Losartan 50 mg daily. SR this am.Will transition to oral Amiodarone later this am. He is still hypertensive so will increase Losartan. 2.  Pulmonary - On room air.  Encourage incentive spirometer. 3. Volume Overload - On Lasix 40 mg daily 4.  Expected post op acute blood loss anemia - Last H and H decreased to 8.7 and 25. 5. Hypothyroidism-continue Levothyroxine 50 mcg daily. 6. Patient initially wanted to go to SNF. As he will likely be remaining here today yet, he would like to see if arrangements could be made for him to go home [facility], home PT etc) as would now prefer to go home.  Donielle M ZimmermanPA-C 10/14/2020,7:10 AM  Agree with above dispo planning  Glover Capano O Masyn Fullam

## 2020-10-15 MED ORDER — POTASSIUM CHLORIDE CRYS ER 20 MEQ PO TBCR: 20.0000 meq | EXTENDED_RELEASE_TABLET | Freq: Every day | ORAL | 0 refills | Status: DC

## 2020-10-15 MED ORDER — AMLODIPINE BESYLATE 5 MG PO TABS
5.0000 mg | ORAL_TABLET | Freq: Every day | ORAL | Status: DC
  Administered 2020-10-15: 5 mg via ORAL
  Filled 2020-10-15: qty 1

## 2020-10-15 MED ORDER — FUROSEMIDE 40 MG PO TABS: 40.0000 mg | ORAL_TABLET | Freq: Every day | ORAL | 0 refills | Status: DC

## 2020-10-15 MED ORDER — METOPROLOL TARTRATE 25 MG PO TABS: 25.0000 mg | ORAL_TABLET | Freq: Two times a day (BID) | ORAL | 1 refills | Status: AC

## 2020-10-15 MED ORDER — TRAMADOL HCL 50 MG PO TABS: 50.0000 mg | ORAL_TABLET | Freq: Four times a day (QID) | ORAL | 0 refills | Status: DC | PRN

## 2020-10-15 MED ORDER — AMIODARONE HCL 200 MG PO TABS: 200.0000 mg | ORAL_TABLET | Freq: Two times a day (BID) | ORAL | 1 refills | Status: DC

## 2020-10-15 NOTE — Progress Notes (Signed)
CARDIAC REHAB PHASE I   D/c education completed with pt. Reinforced importance of sternal precautions, IS use, and walks. Pt given heart healthy diet. Reviewed restrictions and exercise guidelines. Will refer to CRP II [place].   [id number] Rufina Falco, RN BSN 10/15/2020 10:41 AM

## 2020-10-15 NOTE — TOC Transition Note (Signed)
Transition of Care (TOC) - CM/SW Discharge Note Marvetta Gibbons RN, BSN Transitions of Care Unit 4E- RN Case Manager See Treatment Team for direct phone #    Patient Details  Name: Andrew Richardson MRN: [id number] Date of Birth: [birth year]  Transition of Care [facility]) CM/SW Contact:  Dawayne Patricia, RN Phone Number: 10/15/2020, 11:27 AM   Clinical Narrative:    Pt stable for transition home today, orders placed for HHRN/PT- as per discussion with pt yesterday- pt does not have a preference a call made to [facility] for [facility] referral- referral has been accepted for HHRN/PT needs.  Confirmed RW and 3n1 has been delivered to the room for pt to take home.   Confirmed with [facility] transportation that they can transport home- unit secretary to assist when pt has been readied for discharge. Per pt he has a friend that will pick meds up from his pharmacy    Final next level of care: [place] Barriers to Discharge: Barriers Resolved   Patient Goals and CMS Choice Patient states their goals for this hospitalization and ongoing recovery are:: return home CMS Medicare.gov Compare Post Acute Care list provided to:: Patient Choice offered to / list presented to : Patient  Discharge Placement               Home with [facility]        Discharge Plan and Services In-house Referral: Clinical Social Work              DME Arranged: 3-N-1,Walker rolling DME Agency: [employer] Date DME Agency Contacted: 10/14/20 Time DME Agency Contacted: [phone] Representative spoke with at DME Agency: [place]: RN,PT Experiment Agency: [place] Date [place]: 10/15/20 Time [place]: 1030 Representative spoke with at [place]: [place] ([place]) Interventions     Readmission Risk Interventions Readmission Risk Prevention Plan 10/15/2020  Post Dischage Appt Complete  Medication Screening Complete  Transportation Screening  Complete  Some recent data might be hidden

## 2020-10-15 NOTE — Progress Notes (Signed)
CARDIAC REHAB PHASE I   Began d/c ed with pt. Pt educated on importance of site care and monitoring incision daily. Encouraged continued IS use, walks, and sternal precautions. Pt needing rest room, papers left at bedside. Pt helped to BR, BR call light within reach. Will f/u to complete ed.  [id number] Rufina Falco, RN BSN 10/15/2020 9:30 AM

## 2020-10-15 NOTE — Progress Notes (Signed)
PT Cancellation Note  Patient Details Name: Andrew Richardson MRN: [id number] DOB: [birth year]   Cancelled Treatment:    Reason Eval/Treat Not Completed: Fatigue/lethargy limiting ability to participate. Pt received in bed. Reports he recently went to the bathroom then took a cup of pills and it 'knocked him on his tail.' Pt reports "I don't think my legs would hold me up right now." PT to re-attempt as time allows.   Lorriane Shire 10/15/2020, 10:01 AM  Lorrin Goodell, PT  Office # [phone] Pager [phone]

## 2020-10-15 NOTE — Progress Notes (Signed)
D/C instructions given to patient. Medications and wound care reviewed. All questions answered. IV removed, clean and intact. Safe transport arranged to escort pt home with walker and 3n1.  Clyde Canterbury, RN

## 2020-10-15 NOTE — Progress Notes (Signed)
      [address]       [place] [address]             [phone]        7 Days Post-Op Procedure(s) (LRB): CORONARY ARTERY BYPASS GRAFTING TIMES  FOUR  WITH LEFT INTERNAL MAMMARY ARTERY AND ENDOSCOPIC HARVEST OF BILATERAL  LEG SAPHENOUS VEIN (N/A) TRANSESOPHAGEAL ECHOCARDIOGRAM (TEE) (N/A)  Subjective: Patient states he continues to feel better and slowly getting stronger. He denies further nausea or dizziness.  Objective: Vital signs in last 24 hours: Temp:  [97.6 F (36.4 C)-98.6 F (37 C)] 98.6 F (37 C) (01/19 0320) Pulse Rate:  [67-85] 73 (01/19 0320) Cardiac Rhythm: Normal sinus rhythm;Heart block (01/18 2000) Resp:  [17-20] 17 (01/19 0320) BP: (136-177)/(75-87) 151/87 (01/19 0320) SpO2:  [97 %-100 %] 97 % (01/19 0320) Weight:  [92.7 kg] 92.7 kg (01/19 0320)  Pre op weight 86.1 kg Current Weight  10/15/20 92.7 kg       Intake/Output from previous day: 01/18 0701 - 01/19 0700 In: -  Out: 800 [Urine:800]   Physical Exam:  Cardiovascular: RRR Pulmonary: Slightly diminished bibasilar breath sounds Abdomen: Soft, non tender, bowel sounds present. Extremities: Bilateral lower extremity edema. Wounds: Clean and dry.  No erythema or signs of infection. Minor sero sanguinous drainage stab wound left leg  Lab Results: CBC: No results for input(s): WBC, HGB, HCT, PLT in the last 72 hours. BMET:  No results for input(s): NA, K, CL, CO2, GLUCOSE, BUN, CREATININE, CALCIUM in the last 72 hours.  PT/INR:  Lab Results  Component Value Date   INR 1.4 (H) 10/08/2020   INR 1.0 10/05/2020   ABG:  INR: Will add last result for INR, ABG once components are confirmed Will add last 4 CBG results once components are confirmed  Assessment/Plan:  1. CV -  Previous a fib with CVR 01/17 after 4 pm. On Amiodarone 400 mg bid, Losartan 100 mg daily. Maintaining SR this am. He is still fairly hypertensive so will restart Amlodipine. 2.  Pulmonary - On  room air.  Encourage incentive spirometer. 3. Volume Overload - On Lasix 40 mg daily  4.  Expected post op acute blood loss anemia - Last H and H decreased to 8.7 and 25. 5. Hypothyroidism-continue Levothyroxine 50 mcg daily. 6. HH and home PT ordered as patient prefers to go home (has some assistance) rather than SNF. 7. Likely discharge later this am  Torres H[place] M ZimmermanPA-C 10/15/2020,7:32 AM

## 2020-10-15 NOTE — Discharge Instructions (Signed)
Discharge Instructions:  1. You may shower, please wash incisions daily with soap and water and keep dry.  If you wish to cover wounds with dressing you may do so but please keep clean and change daily.  No tub baths or swimming until incisions have completely healed.  If your incisions become red or develop any drainage please call our office at [phone]  2. No Driving until cleared by Dr. Abran Duke office and you are no longer using narcotic pain medications  3. Monitor your weight daily.. Please use the same scale and weigh at same time... If you gain 5-10 lbs in 48 hours with associated lower extremity swelling, please contact our office at [phone]  4. Fever of 101.5 for at least 24 hours with no source, please contact our office at [phone]  5. Activity- up as tolerated, please walk at least 3 times per day.  Avoid strenuous activity, no lifting, pushing, or pulling with your arms over 8-10 lbs for a minimum of 6 weeks  6. If any questions or concerns arise, please do not hesitate to contact our office at [phone]   Prediabetes Eating Plan Prediabetes is a condition that causes blood sugar (glucose) levels to be higher than normal. This increases the risk for developing type 2 diabetes (type 2 diabetes mellitus). Working with a health care provider or nutrition specialist (dietitian) to make diet and lifestyle changes can help prevent the onset of diabetes. These changes may help you:  Control your blood glucose levels.  Improve your cholesterol levels.  Manage your blood pressure. What are tips for following this plan? Reading food labels  Read food labels to check the amount of fat, salt (sodium), and sugar in prepackaged foods. Avoid foods that have: ? Saturated fats. ? Trans fats. ? Added sugars.  Avoid foods that have more than 300 milligrams (mg) of sodium per serving. Limit your sodium intake to less than 2,300 mg each day. Shopping  Avoid buying  pre-made and processed foods.  Avoid buying drinks with added sugar. Cooking  Cook with olive oil. Do not use butter, lard, or ghee.  Bake, broil, grill, steam, or boil foods. Avoid frying. Meal planning  Work with your dietitian to create an eating plan that is right for you. This may include tracking how many calories you take in each day. Use a food diary, notebook, or mobile application to track what you eat at each meal.  Consider following a Mediterranean diet. This includes: ? Eating several servings of fresh fruits and vegetables each day. ? Eating fish at least twice a week. ? Eating one serving each day of whole grains, beans, nuts, and seeds. ? Using olive oil instead of other fats. ? Limiting alcohol. ? Limiting red meat. ? Using nonfat or low-fat dairy products.  Consider following a plant-based diet. This includes dietary choices that focus on eating mostly vegetables and fruit, grains, beans, nuts, and seeds.  If you have high blood pressure, you may need to limit your sodium intake or follow a diet such as the DASH (Dietary Approaches to Stop Hypertension) eating plan. The DASH diet aims to lower high blood pressure.   Lifestyle  Set weight loss goals with help from your health care team. It is recommended that most people with prediabetes lose 7% of their body weight.  Exercise for at least 30 minutes 5 or more days a week.  Attend a support group or seek support from a mental health counselor.  Take over-the-counter and  prescription medicines only as told by your health care provider. What foods are recommended? Fruits Berries. Bananas. Apples. Oranges. Grapes. Papaya. Mango. Pomegranate. Kiwi. Grapefruit. Cherries. Vegetables Lettuce. Spinach. Peas. Beets. Cauliflower. Cabbage. Broccoli. Carrots. Tomatoes. Squash. Eggplant. Herbs. Peppers. Onions. Cucumbers. Brussels sprouts. Grains Whole grains, such as whole-wheat or whole-grain breads, crackers, cereals,  and pasta. Unsweetened oatmeal. Bulgur. Barley. Quinoa. Brown rice. Corn or whole-wheat flour tortillas or taco shells. Meats and other proteins Seafood. Poultry without skin. Lean cuts of pork and beef. Tofu. Eggs. Nuts. Beans. Dairy Low-fat or fat-free dairy products, such as yogurt, cottage cheese, and cheese. Beverages Water. Tea. Coffee. Sugar-free or diet soda. Seltzer water. Low-fat or nonfat milk. Milk alternatives, such as soy or almond milk. Fats and oils Olive oil. Canola oil. Sunflower oil. Grapeseed oil. Avocado. Walnuts. Sweets and desserts Sugar-free or low-fat pudding. Sugar-free or low-fat ice cream and other frozen treats. Seasonings and condiments Herbs. Sodium-free spices. Mustard. Relish. Low-salt, low-sugar ketchup. Low-salt, low-sugar barbecue sauce. Low-fat or fat-free mayonnaise. The items listed above may not be a complete list of recommended foods and beverages. Contact a dietitian for more information. What foods are not recommended? Fruits Fruits canned with syrup. Vegetables Canned vegetables. Frozen vegetables with butter or cream sauce. Grains Refined white flour and flour products, such as bread, pasta, snack foods, and cereals. Meats and other proteins Fatty cuts of meat. Poultry with skin. Breaded or fried meat. Processed meats. Dairy Full-fat yogurt, cheese, or milk. Beverages Sweetened drinks, such as iced tea and soda. Fats and oils Butter. Lard. Ghee. Sweets and desserts Baked goods, such as cake, cupcakes, pastries, cookies, and cheesecake. Seasonings and condiments Spice mixes with added salt. Ketchup. Barbecue sauce. Mayonnaise. The items listed above may not be a complete list of foods and beverages that are not recommended. Contact a dietitian for more information. Where to find more information  American Diabetes Association: www.diabetes.org Summary  You may need to make diet and lifestyle changes to help prevent the onset of  diabetes. These changes can help you control blood sugar, improve cholesterol levels, and manage blood pressure.  Set weight loss goals with help from your health care team. It is recommended that most people with prediabetes lose 7% of their body weight.  Consider following a Mediterranean diet. This includes eating plenty of fresh fruits and vegetables, whole grains, beans, nuts, seeds, fish, and low-fat dairy, and using olive oil instead of other fats. This information is not intended to replace advice given to you by your health care provider. Make sure you discuss any questions you have with your health care provider. Document Revised: 12/13/2019 Document Reviewed: 12/13/2019 Elsevier Patient Education  [place].

## 2020-10-20 ENCOUNTER — Telehealth: Payer: Self-pay

## 2020-10-20 NOTE — Telephone Encounter (Signed)
Patient contacted the office with questions about his medications.  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  He was discharged 10/15/20 and stated that some of his medications were not filled.  He did not get Flomax or his Liquidtears.  He stated that he was taking the medication before he went into the hospital, but had stopped the Flomax because he thought it was not working for him.  Advised patient that he would need to get the refill for Flomax from his PCP, Dr. Baldemar Lenis and that he could get his eye drops from his pharmacy over-the-counter.  He acknowledged receipt.

## 2020-10-21 ENCOUNTER — Telehealth: Payer: Self-pay

## 2020-10-21 NOTE — Telephone Encounter (Signed)
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  He stated that he took the 5 days worth of Lasix as prescribed but legs have not gone down.  He is weighting himself daily, trending down.  Elevating them when seated, and is only short of breath at times when he is up and active.  He stated that his blood pressure, when last checked was 130/78 by [facility] nurse, also creatinine was normal at discharge from the hospital 10/15/20. Patient asked if he should restart his HCTZ that he was taking prior to surgery.    Nicholes Rough, PA advised patient restart HCTZ for swelling.  Dr. Kipp Brood notified as well.  Patient aware, and does have medication at home.  Patient has follow-up appointment with Dr. Kipp Brood Friday, 10/24/20.

## 2020-10-23 ENCOUNTER — Telehealth: Payer: Self-pay

## 2020-10-23 NOTE — Telephone Encounter (Signed)
-----   Message from Lajuana Matte, MD sent at 10/22/2020 12:24 PM EST ----- Regarding: RE: Lasix/ HCTZ Thanks, that should be fine. ----- Message ----- From: Andrew Sham, RN Sent: 10/21/2020   4:17 PM EST To: Ladon Applebaum, RN, Marylen Ponto, LPN, # Subject: Lasix/ HCTZ                                    Hey,  I was able to speak with Johann Capers and advised patient to restart his HCTZ that he was taking before admission.  His blood pressures have been stable at home 130/78 was the last taken.  Also, before he left the hospital, his creatinine was WNL.  Advised patient to restart HCTZ.  If you have other recommendations, please let me know.  Thanks,  Caryl Pina

## 2020-10-24 ENCOUNTER — Encounter: Payer: Self-pay | Admitting: Thoracic Surgery (Cardiothoracic Vascular Surgery)

## 2020-10-24 ENCOUNTER — Ambulatory Visit (INDEPENDENT_AMBULATORY_CARE_PROVIDER_SITE_OTHER): Payer: Self-pay | Admitting: Thoracic Surgery (Cardiothoracic Vascular Surgery)

## 2020-10-24 ENCOUNTER — Other Ambulatory Visit: Payer: Self-pay

## 2020-10-24 VITALS — BP 138/66 | HR 74 | Temp 97.7°F | Resp 20 | Ht 68.0 in | Wt 190.0 lb

## 2020-10-24 DIAGNOSIS — Z951 Presence of aortocoronary bypass graft: Secondary | ICD-10-CM

## 2020-10-24 MED ORDER — TRAMADOL HCL 50 MG PO TABS: 50.0000 mg | ORAL_TABLET | Freq: Four times a day (QID) | ORAL | 0 refills | Status: DC | PRN

## 2020-10-24 NOTE — Progress Notes (Signed)
      [address]       [place] [address]             [phone]        Kyzen A Slutsky [facility] Medical Record #[id number] Date of Birth: 1941/04/30  Referring: Isaias Cowman, MD Primary Care: Derinda Late, MD Primary Cardiologist:No primary care provider on file.  Reason for visit:   follow-up  History of Present Illness:     Mr. Riesgo comes in for his 1 week appointment.  Overall he is doing quite well at home.  He does have the concern for being slightly sad since he has been home.  Physical Exam: BP 138/66   Pulse 74   Temp 97.7 F (36.5 C) (Skin)   Resp 20   Ht 5\' 8"  (1.727 m)   Wt 190 lb (86.2 kg)   SpO2 100% Comment: RA  BMI 28.89 kg/m   Alert NAD Incision clean.  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 His heart rate was regular today. I will see him back in 1 month with a chest x-ray. We will also reassess his mood at this visit.   Lajuana Matte 10/24/2020 2:35 PM

## 2020-10-29 DIAGNOSIS — I25810 Atherosclerosis of coronary artery bypass graft(s) without angina pectoris: Secondary | ICD-10-CM | POA: Insufficient documentation

## 2020-11-19 ENCOUNTER — Other Ambulatory Visit: Payer: Self-pay

## 2020-11-19 ENCOUNTER — Encounter: Payer: Medicare Other | Attending: Cardiology | Admitting: *Deleted

## 2020-11-19 DIAGNOSIS — Z951 Presence of aortocoronary bypass graft: Secondary | ICD-10-CM

## 2020-11-19 NOTE — Progress Notes (Signed)
Initial telephone orientation completed. Diagnosis can be found in [facility] 1/10. EP orientation scheduled for Monday 3/7 at 10am.

## 2020-11-27 ENCOUNTER — Other Ambulatory Visit: Payer: Self-pay | Admitting: Thoracic Surgery (Cardiothoracic Vascular Surgery)

## 2020-11-27 ENCOUNTER — Telehealth: Payer: Self-pay

## 2020-11-27 DIAGNOSIS — Z951 Presence of aortocoronary bypass graft: Secondary | ICD-10-CM

## 2020-11-27 NOTE — Telephone Encounter (Signed)
Cecille Rubin, RN with [facility] contacted the office [phone] requesting possible medication changes for patient as he was experiencing HR in 40s- 50s, tired, and cannot walk far.  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  Advised to contact patient's Cardiologist, Dr. Saralyn Pilar office for possible medication adjustments.  She acknowledged receipt.  Patient does have appointment with Dr. Kipp Brood tomorrow, 11/28/20 which patient is aware of.

## 2020-11-28 ENCOUNTER — Ambulatory Visit (INDEPENDENT_AMBULATORY_CARE_PROVIDER_SITE_OTHER): Payer: Self-pay | Admitting: Thoracic Surgery (Cardiothoracic Vascular Surgery)

## 2020-11-28 ENCOUNTER — Other Ambulatory Visit: Payer: Self-pay

## 2020-11-28 ENCOUNTER — Encounter: Payer: Self-pay | Admitting: Thoracic Surgery (Cardiothoracic Vascular Surgery)

## 2020-11-28 ENCOUNTER — Ambulatory Visit
Admission: RE | Admit: 2020-11-28 | Discharge: 2020-11-28 | Disposition: A | Discharge status: Home / Self Care | Payer: Medicare Other | Source: Ambulatory Visit | Attending: Thoracic Surgery (Cardiothoracic Vascular Surgery) | Admitting: Thoracic Surgery (Cardiothoracic Vascular Surgery)

## 2020-11-28 VITALS — BP 147/80 | HR 56 | Resp 20 | Ht 68.0 in | Wt 186.0 lb

## 2020-11-28 DIAGNOSIS — Z951 Presence of aortocoronary bypass graft: Secondary | ICD-10-CM

## 2020-11-28 NOTE — Progress Notes (Signed)
      [address]       [place] [address]             [phone]        Ndrew A Freer [place] Medical Record #[id number] Date of Birth: June 12, 1941  Referring: Isaias Cowman, MD Primary Care: Derinda Late, MD Primary Cardiologist:No primary care provider on file.  Reason for visit:   follow-up  History of Present Illness:     Mr. Fuston comes in for his 1 month follow-up appointment.  Overall he is doing quite well.  He does say that he has some residual weakness in his legs, but this is intermittent.  Physical Exam: BP (!) 147/80   Pulse (!) 56   Resp 20   Ht 5\' 8"  (1.727 m)   Wt 186 lb (84.4 kg)   SpO2 98% Comment: RA  BMI 28.28 kg/m   Alert NAD Incision clean.  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  Currently doing quite well. He is hypertensive today and bradycardic.  He is scheduled to meet with his cardiologist in the next few weeks. Cleared for cardiac rehab Follow-up as needed   Lajuana Matte 11/28/2020 11:19 AM

## 2020-12-01 ENCOUNTER — Encounter: Payer: Medicare Other | Attending: Cardiology | Admitting: *Deleted

## 2020-12-01 ENCOUNTER — Other Ambulatory Visit: Payer: Self-pay

## 2020-12-01 VITALS — Ht 67.25 in | Wt 187.9 lb

## 2020-12-01 DIAGNOSIS — Z5189 Encounter for other specified aftercare: Secondary | ICD-10-CM | POA: Insufficient documentation

## 2020-12-01 DIAGNOSIS — Z951 Presence of aortocoronary bypass graft: Secondary | ICD-10-CM | POA: Insufficient documentation

## 2020-12-01 NOTE — Progress Notes (Signed)
Cardiac Individual Treatment Plan  Patient Details  Name: Andrew Richardson MRN: [id number] Date of Birth: September 13, 1941 Referring Provider:   Flowsheet Row Cardiac Rehab from 12/01/2020 in [facility] Cardiac and Pulmonary Rehab  Referring Provider Isaias Cowman MD      Initial Encounter Date:  Flowsheet Row Cardiac Rehab from 12/01/2020 in [facility] Cardiac and Pulmonary Rehab  Date 12/01/20      Visit Diagnosis: S/P CABG x 4  Patient's Home Medications on Admission:  Current Outpatient Medications:  .  amiodarone (PACERONE) 200 MG tablet, Take 1 tablet (200 mg total) by mouth 2 (two) times daily. For 10 days;then take 200 mg daily thereafter, Disp: 60 tablet, Rfl: 1 .  amLODipine (NORVASC) 5 MG tablet, Take 5 mg by mouth every evening., Disp: , Rfl:  .  aspirin EC 325 MG EC tablet, Take 1 tablet (325 mg total) by mouth daily., Disp: 30 tablet, Rfl: 0 .  atorvastatin (LIPITOR) 10 MG tablet, Take 10 mg by mouth every evening., Disp: , Rfl:  .  levothyroxine (SYNTHROID) 50 MCG tablet, Take 50 mcg by mouth daily., Disp: , Rfl:  .  losartan (COZAAR) 100 MG tablet, Take 100 mg by mouth every evening., Disp: , Rfl:  .  metoprolol tartrate (LOPRESSOR) 25 MG tablet, Take 1 tablet (25 mg total) by mouth 2 (two) times daily., Disp: 60 tablet, Rfl: 1 .  polyvinyl alcohol (LIQUIFILM TEARS) 1.4 % ophthalmic solution, Place 1 drop into both eyes daily as needed for dry eyes., Disp: , Rfl:  .  tamsulosin (FLOMAX) 0.4 MG CAPS capsule, Take 0.4 mg by mouth daily., Disp: , Rfl:  .  traMADol (ULTRAM) 50 MG tablet, Take 1 tablet (50 mg total) by mouth every 6 (six) hours as needed for moderate pain., Disp: 50 tablet, Rfl: 0  Past Medical History: Past Medical History:  Diagnosis Date  . Colon polyps   . Coronary artery disease   . Depression   . Headache   . Hyperlipemia   . Hypertension   . Hypothyroid   . Prediabetes   . 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 Tubular adenoma of colon      Tobacco Use: Social History   Tobacco Use  Smoking Status Former Smoker  . Quit date: 05/19/1989  . 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 [employer] Cardiac and Pulmonary Rehab Latest Ref Rng & Units 10/08/2020 10/08/2020 10/08/2020 10/08/2020 10/09/2020   Cholestrol 0 - 200 mg/dL - - - - -   LDLCALC 0 - 99 mg/dL - - - - -   HDL >40 mg/dL - - - - -   Trlycerides <150 mg/dL - - - - -   Hemoglobin A1c 4.8 - 5.6 % - - - - -   PHART 7.350 - 7.450 - 7.387 7.282(L) 7.259(L) 7.327(L)   PCO2ART 32.0 - 48.0 mmHg - 38.9 46.1 49.5(H) 40.7   HCO3 20.0 - 28.0 mmol/L - 23.5 21.8 22.1 21.1   TCO2 22 - 32 mmol/L 28 25 23 24 22    ACIDBASEDEF 0.0 - 2.0 mmol/L - 1.0 5.0(H) 5.0(H) 4.0(H)   O2SAT % - 100.0 97.0 98.0 97.0       Exercise Target Goals: Exercise Program Goal: Individual exercise prescription set using results from initial 6 min walk test and THRR while considering  patient's activity barriers and safety.   Exercise Prescription Goal: Initial exercise prescription builds to  30-45 minutes a day of aerobic activity, 2-3 days per week.  Home exercise guidelines will be given to patient during program as part of exercise prescription that the participant will acknowledge.   Education: Aerobic Exercise: - Group verbal and visual presentation on the components of exercise prescription. Introduces F.I.T.T principle from ACSM for exercise prescriptions.  Reviews F.I.T.T. principles of aerobic exercise including progression. Written material given at graduation. Flowsheet Row Cardiac Rehab from 12/01/2020 in [facility] Cardiac and Pulmonary Rehab  Education need identified 12/01/20      Education: Resistance Exercise: - Group verbal and visual presentation on the components of exercise prescription. Introduces F.I.T.T principle from ACSM for exercise prescriptions  Reviews F.I.T.T. principles of resistance exercise including  progression. Written material given at graduation.    Education: Exercise & Equipment Safety: - Individual verbal instruction and demonstration of equipment use and safety with use of the equipment. Flowsheet Row Cardiac Rehab from 12/01/2020 in [facility] Cardiac and Pulmonary Rehab  Date 12/01/20  Educator [facility]  Instruction Review Code 1- Verbalizes Understanding      Education: Exercise Physiology & General Exercise Guidelines: - Group verbal and written instruction with models to review the exercise physiology of the cardiovascular system and associated critical values. Provides general exercise guidelines with specific guidelines to those with heart or lung disease.    Education: Flexibility, Balance, Mind/Body Relaxation: - Group verbal and visual presentation with interactive activity on the components of exercise prescription. Introduces F.I.T.T principle from ACSM for exercise prescriptions. Reviews F.I.T.T. principles of flexibility and balance exercise training including progression. Also discusses the mind body connection.  Reviews various relaxation techniques to help reduce and manage stress (i.e. Deep breathing, progressive muscle relaxation, and visualization). Balance handout provided to take home. Written material given at graduation.   Activity Barriers & Risk Stratification:  Activity Barriers & Cardiac Risk Stratification - 12/01/20 1209      Activity Barriers & Cardiac Risk Stratification   Activity Barriers Incisional Pain;Deconditioning;Muscular Weakness;Balance Concerns    Cardiac Risk Stratification High           6 Minute Walk:  6 Minute Walk    Row Name 12/01/20 1207         6 Minute Walk   Phase Initial     Distance 785 feet     Walk Time 6 minutes     # of Rest Breaks 0     MPH 1.49     METS 1.17     RPE 13     Perceived Dyspnea  1     VO2 Peak 4.09     Symptoms Yes (comment)     Comments some leg weakness 1/10, using handrail     Resting HR 52 bpm      Resting BP 132/62     Resting Oxygen Saturation  97 %     Exercise Oxygen Saturation  during 6 min walk 96 %     Max Ex. HR 83 bpm     Max Ex. BP 136/76     2 Minute Post BP 132/74            Oxygen Initial Assessment:   Oxygen Re-Evaluation:   Oxygen Discharge (Final Oxygen Re-Evaluation):   Initial Exercise Prescription:  Initial Exercise Prescription - 12/01/20 1200      Date of Initial Exercise RX and Referring Provider   Date 12/01/20    Referring Provider Isaias Cowman MD      Treadmill   MPH 1.4  Grade 0    Minutes 15    METs 2.07      NuStep   Level 1    SPM 80    Minutes 15    METs 2      T5 Nustep   Level 1    SPM 80    Minutes 15    METs 2      Biostep-RELP   Level 1    SPM 50    Minutes 15    METs 2      Prescription Details   Frequency (times per week) 3    Duration Progress to 30 minutes of continuous aerobic without signs/symptoms of physical distress      Intensity   THRR 40-80% of Max Heartrate 87-122    Ratings of Perceived Exertion 11-13    Perceived Dyspnea 0-4      Progression   Progression Continue to progress workloads to maintain intensity without signs/symptoms of physical distress.      Resistance Training   Training Prescription Yes    Weight 4 lb    Reps 10-15           Perform Capillary Blood Glucose checks as needed.  Exercise Prescription Changes:  Exercise Prescription Changes    Row Name 12/01/20 1200             Response to Exercise   Blood Pressure (Admit) 132/62       Blood Pressure (Exercise) 136/76       Blood Pressure (Exit) 132/74       Heart Rate (Admit) 52 bpm       Heart Rate (Exercise) 83 bpm       Heart Rate (Exit) 54 bpm       Oxygen Saturation (Admit) 97 %       Oxygen Saturation (Exercise) 96 %       Rating of Perceived Exertion (Exercise) 13       Perceived Dyspnea (Exercise) 1       Symptoms leg weakness 1/10       Comments walk test results               Exercise Comments:   Exercise Goals and Review:  Exercise Goals    Row Name 12/01/20 1213             Exercise Goals   Increase Physical Activity Yes       Intervention Provide advice, education, support and counseling about physical activity/exercise needs.;Develop an individualized exercise prescription for aerobic and resistive training based on initial evaluation findings, risk stratification, comorbidities and participant's personal goals.       Expected Outcomes Short Term: Attend rehab on a regular basis to increase amount of physical activity.;Long Term: Add in home exercise to make exercise part of routine and to increase amount of physical activity.;Long Term: Exercising regularly at least 3-5 days a week.       Increase Strength and Stamina Yes       Intervention Provide advice, education, support and counseling about physical activity/exercise needs.;Develop an individualized exercise prescription for aerobic and resistive training based on initial evaluation findings, risk stratification, comorbidities and participant's personal goals.       Expected Outcomes Short Term: Increase workloads from initial exercise prescription for resistance, speed, and METs.;Short Term: Perform resistance training exercises routinely during rehab and add in resistance training at home;Long Term: Improve cardiorespiratory fitness, muscular endurance and strength as measured by increased METs and functional capacity (  6MWT)       Able to understand and use rate of perceived exertion (RPE) scale Yes       Intervention Provide education and explanation on how to use RPE scale       Expected Outcomes Short Term: Able to use RPE daily in rehab to express subjective intensity level;Long Term:  Able to use RPE to guide intensity level when exercising independently       Able to understand and use Dyspnea scale Yes       Intervention Provide education and explanation on how to use Dyspnea scale        Expected Outcomes Long Term: Able to use Dyspnea scale to guide intensity level when exercising independently;Short Term: Able to use Dyspnea scale daily in rehab to express subjective sense of shortness of breath during exertion       Knowledge and understanding of Target Heart Rate Range (THRR) Yes       Intervention Provide education and explanation of THRR including how the numbers were predicted and where they are located for reference       Expected Outcomes Short Term: Able to state/look up THRR;Short Term: Able to use daily as guideline for intensity in rehab;Long Term: Able to use THRR to govern intensity when exercising independently       Able to check pulse independently Yes       Intervention Provide education and demonstration on how to check pulse in carotid and radial arteries.;Review the importance of being able to check your own pulse for safety during independent exercise       Expected Outcomes Short Term: Able to explain why pulse checking is important during independent exercise;Long Term: Able to check pulse independently and accurately       Understanding of Exercise Prescription Yes       Intervention Provide education, explanation, and written materials on patient's individual exercise prescription       Expected Outcomes Short Term: Able to explain program exercise prescription;Long Term: Able to explain home exercise prescription to exercise independently              Exercise Goals Re-Evaluation :   Discharge Exercise Prescription (Final Exercise Prescription Changes):  Exercise Prescription Changes - 12/01/20 1200      Response to Exercise   Blood Pressure (Admit) 132/62    Blood Pressure (Exercise) 136/76    Blood Pressure (Exit) 132/74    Heart Rate (Admit) 52 bpm    Heart Rate (Exercise) 83 bpm    Heart Rate (Exit) 54 bpm    Oxygen Saturation (Admit) 97 %    Oxygen Saturation (Exercise) 96 %    Rating of Perceived Exertion (Exercise) 13    Perceived  Dyspnea (Exercise) 1    Symptoms leg weakness 1/10    Comments walk test results           Nutrition:  Target Goals: Understanding of nutrition guidelines, daily intake of sodium 1500mg , cholesterol 200mg , calories 30% from fat and 7% or less from saturated fats, daily to have 5 or more servings of fruits and vegetables.  Education: All About Nutrition: -Group instruction provided by verbal, written material, interactive activities, discussions, models, and posters to present general guidelines for heart healthy nutrition including fat, fiber, MyPlate, the role of sodium in heart healthy nutrition, utilization of the nutrition label, and utilization of this knowledge for meal planning. Follow up email sent as well. Written material given at graduation.   Biometrics:  Pre Biometrics - 12/01/20 1213      Pre Biometrics   Height 5' 7.25" (1.708 m)    Weight 187 lb 14.4 oz (85.2 kg)    BMI (Calculated) 29.22    Single Leg Stand 1.2 seconds            Nutrition Therapy Plan and Nutrition Goals:  Nutrition Therapy & Goals - 12/01/20 1214      Intervention Plan   Intervention Prescribe, educate and counsel regarding individualized specific dietary modifications aiming towards targeted core components such as weight, hypertension, lipid management, diabetes, heart failure and other comorbidities.;Nutrition handout(s) given to patient.    Expected Outcomes Short Term Goal: Understand basic principles of dietary content, such as calories, fat, sodium, cholesterol and nutrients.;Short Term Goal: A plan has been developed with personal nutrition goals set during dietitian appointment.;Long Term Goal: Adherence to prescribed nutrition plan.           Nutrition Assessments:  MEDIFICTS Score Key:  ?70 Need to make dietary changes   40-70 Heart Healthy Diet  ? 40 Therapeutic Level Cholesterol Diet  Flowsheet Row Cardiac Rehab from 12/01/2020 in [facility] Cardiac and Pulmonary Rehab   Picture Your Plate Total Score on Admission 75     Picture Your Plate Scores:  <93 Unhealthy dietary pattern with much room for improvement.  41-50 Dietary pattern unlikely to meet recommendations for good health and room for improvement.  51-60 More healthful dietary pattern, with some room for improvement.   >60 Healthy dietary pattern, although there may be some specific behaviors that could be improved.    Nutrition Goals Re-Evaluation:   Nutrition Goals Discharge (Final Nutrition Goals Re-Evaluation):   Psychosocial: Target Goals: Acknowledge presence or absence of significant depression and/or stress, maximize coping skills, provide positive support system. Participant is able to verbalize types and ability to use techniques and skills needed for reducing stress and depression.   Education: Stress, Anxiety, and Depression - Group verbal and visual presentation to define topics covered.  Reviews how body is impacted by stress, anxiety, and depression.  Also discusses healthy ways to reduce stress and to treat/manage anxiety and depression.  Written material given at graduation.   Education: Sleep Hygiene -Provides group verbal and written instruction about how sleep can affect your health.  Define sleep hygiene, discuss sleep cycles and impact of sleep habits. Review good sleep hygiene tips.    Initial Review & Psychosocial Screening:  Initial Psych Review & Screening - 11/19/20 1405      Initial Review   Current issues with Current Abuse or Neglect to Report;Current Anxiety/Panic;Current Stress Concerns    Source of Stress Concerns Unable to participate in former interests or hobbies;Unable to perform yard/household activities      [place]? Yes   wife and good friends     Barriers   Psychosocial barriers to participate in program There are no identifiable barriers or psychosocial needs.;The patient should benefit from training in stress  management and relaxation.      Screening Interventions   Interventions Encouraged to exercise;Provide feedback about the scores to participant;To provide support and resources with identified psychosocial needs    Expected Outcomes Short Term goal: Utilizing psychosocial counselor, staff and physician to assist with identification of specific Stressors or current issues interfering with healing process. Setting desired goal for each stressor or current issue identified.;Long Term Goal: Stressors or current issues are controlled or eliminated.;Short Term goal: Identification and review with participant  of any Quality of Life or Depression concerns found by scoring the questionnaire.;Long Term goal: The participant improves quality of Life and PHQ9 Scores as seen by post scores and/or verbalization of changes           Quality of Life Scores:   Quality of Life - 12/01/20 1214      Quality of Life   Select Quality of Life      Quality of Life Scores   Health/Function Pre 17.27 %    Socioeconomic Pre 25 %    Psych/Spiritual Pre 14.71 %    Family Pre 23.13 %    GLOBAL Pre 19.08 %          Scores of 19 and below usually indicate a poorer quality of life in these areas.  A difference of  2-3 points is a clinically meaningful difference.  A difference of 2-3 points in the total score of the Quality of Life Index has been associated with significant improvement in overall quality of life, self-image, physical symptoms, and general health in studies assessing change in quality of life.  PHQ-9: Recent Review Flowsheet Data    Depression screen [facility] 2/9 12/01/2020   Decreased Interest 1   Down, Depressed, Hopeless 1   PHQ - 2 Score 2   Altered sleeping 0   Tired, decreased energy 1   Change in appetite 1   Feeling bad or failure about yourself  2   Trouble concentrating 2   Moving slowly or fidgety/restless 1   Suicidal thoughts 1   PHQ-9 Score 10   Difficult doing work/chores Somewhat  difficult     Interpretation of Total Score  Total Score Depression Severity:  1-4 = Minimal depression, 5-9 = Mild depression, 10-14 = Moderate depression, 15-19 = Moderately severe depression, 20-27 = Severe depression   Psychosocial Evaluation and Intervention:  Psychosocial Evaluation - 11/19/20 1415      Psychosocial Evaluation & Interventions   Interventions Encouraged to exercise with the program and follow exercise prescription;Relaxation education    Comments Mr. Tenbrink reports healing well after his CABG x 4. His wife and friends have helped take care of him and let him heal well. He was quite active before his surgery and hopes to gain back some of his stamina. He wants to maintain independent as long as possible. He did share that his chart says he has a history of depression but he doesn't remember ever being diagnosed with it. He does state that his anxiety has increased since surgery but is getting better. Sometimes he wakes up anxious and worrying about life but he focuses on relaxing and taking deep breaths. He wants to attend cardiac rehab to boost his stamina and gain knowledge to help maintain a heart healthy lifestyle.    Expected Outcomes Short: attend cardiac rehab for education and exercise. Long: develop and maintain positive self care habits.    Continue Psychosocial Services  Follow up required by staff           Psychosocial Re-Evaluation:   Psychosocial Discharge (Final Psychosocial Re-Evaluation):   Vocational Rehabilitation: Provide vocational rehab assistance to qualifying candidates.   Vocational Rehab Evaluation & Intervention:  Vocational Rehab - 11/19/20 1415      Initial Vocational Rehab Evaluation & Intervention   Assessment shows need for Vocational Rehabilitation No           Education: Education Goals: Education classes will be provided on a variety of topics geared toward better understanding of  heart health and risk factor  modification. Participant will state understanding/return demonstration of topics presented as noted by education test scores.  Learning Barriers/Preferences:  Learning Barriers/Preferences - 11/19/20 1415      Learning Barriers/Preferences   Learning Barriers Hearing    Learning Preferences None           General Cardiac Education Topics:  AED/CPR: - Group verbal and written instruction with the use of models to demonstrate the basic use of the AED with the basic ABC's of resuscitation.   Anatomy and Cardiac Procedures: - Group verbal and visual presentation and models provide information about basic cardiac anatomy and function. Reviews the testing methods done to diagnose heart disease and the outcomes of the test results. Describes the treatment choices: Medical Management, Angioplasty, or Coronary Bypass Surgery for treating various heart conditions including Myocardial Infarction, Angina, Valve Disease, and Cardiac Arrhythmias.  Written material given at graduation.   Medication Safety: - Group verbal and visual instruction to review commonly prescribed medications for heart and lung disease. Reviews the medication, class of the drug, and side effects. Includes the steps to properly store meds and maintain the prescription regimen.  Written material given at graduation.   Intimacy: - Group verbal instruction through game format to discuss how heart and lung disease can affect sexual intimacy. Written material given at graduation..   Know Your Numbers and Heart Failure: - Group verbal and visual instruction to discuss disease risk factors for cardiac and pulmonary disease and treatment options.  Reviews associated critical values for Overweight/Obesity, Hypertension, Cholesterol, and Diabetes.  Discusses basics of heart failure: signs/symptoms and treatments.  Introduces Heart Failure Zone chart for action plan for heart failure.  Written material given at  graduation.   Infection Prevention: - Provides verbal and written material to individual with discussion of infection control including proper hand washing and proper equipment cleaning during exercise session. Flowsheet Row Cardiac Rehab from 12/01/2020 in [facility] Cardiac and Pulmonary Rehab  Date 12/01/20  Educator [facility]  Instruction Review Code 1- Verbalizes Understanding      Falls Prevention: - Provides verbal and written material to individual with discussion of falls prevention and safety. Flowsheet Row Cardiac Rehab from 12/01/2020 in [facility] Cardiac and Pulmonary Rehab  Date 12/01/20  Educator [facility]  Instruction Review Code 1- Verbalizes Understanding      Other: -Provides group and verbal instruction on various topics (see comments)   Knowledge Questionnaire Score:  Knowledge Questionnaire Score - 12/01/20 1217      Knowledge Questionnaire Score   Pre Score 24/26 Education Focus: Pulse, exercise           Core Components/Risk Factors/Patient Goals at Admission:  Personal Goals and Risk Factors at Admission - 12/01/20 1217      Core Components/Risk Factors/Patient Goals on Admission    Weight Management Yes;Weight Loss    Intervention Weight Management: Develop a combined nutrition and exercise program designed to reach desired caloric intake, while maintaining appropriate intake of nutrient and fiber, sodium and fats, and appropriate energy expenditure required for the weight goal.;Weight Management: Provide education and appropriate resources to help participant work on and attain dietary goals.;Weight Management/Obesity: Establish reasonable short term and long term weight goals.    Admit Weight 187 lb 14.4 oz (85.2 kg)    Goal Weight: Short Term 182 lb (82.6 kg)    Goal Weight: Long Term 175 lb (79.4 kg)    Expected Outcomes Short Term: Continue to assess and modify interventions until short  term weight is achieved;Long Term: Adherence to nutrition and physical  activity/exercise program aimed toward attainment of established weight goal;Understanding recommendations for meals to include 15-35% energy as protein, 25-35% energy from fat, 35-60% energy from carbohydrates, less than 200mg  of dietary cholesterol, 20-35 gm of total fiber daily;Understanding of distribution of calorie intake throughout the day with the consumption of 4-5 meals/snacks;Weight Loss: Understanding of general recommendations for a balanced deficit meal plan, which promotes 1-2 lb weight loss per week and includes a negative energy balance of [phone] kcal/d    Hypertension Yes    Intervention Provide education on lifestyle modifcations including regular physical activity/exercise, weight management, moderate sodium restriction and increased consumption of fresh fruit, vegetables, and low fat dairy, alcohol moderation, and smoking cessation.;Monitor prescription use compliance.    Expected Outcomes Short Term: Continued assessment and intervention until BP is < 140/38mm HG in hypertensive participants. < 130/39mm HG in hypertensive participants with diabetes, heart failure or chronic kidney disease.;Long Term: Maintenance of blood pressure at goal levels.    Lipids Yes    Intervention Provide education and support for participant on nutrition & aerobic/resistive exercise along with prescribed medications to achieve LDL 70mg , HDL >40mg .    Expected Outcomes Short Term: Participant states understanding of desired cholesterol values and is compliant with medications prescribed. Participant is following exercise prescription and nutrition guidelines.;Long Term: Cholesterol controlled with medications as prescribed, with individualized exercise RX and with personalized nutrition plan. Value goals: LDL < 70mg , HDL > 40 mg.           Education:Diabetes - Individual verbal and written instruction to review signs/symptoms of diabetes, desired ranges of glucose level fasting, after meals and with  exercise. Acknowledge that pre and post exercise glucose checks will be done for 3 sessions at entry of program.   Core Components/Risk Factors/Patient Goals Review:    Core Components/Risk Factors/Patient Goals at Discharge (Final Review):    ITP Comments:  ITP Comments    Row Name 11/19/20 1419 12/01/20 1207         ITP Comments Initial telephone orientation completed. Diagnosis can be found in [facility] 1/10. EP orientation scheduled for Monday 3/7 at 10am. Completed 6MWT and gym orientation. Initial ITP created and sent for review to Dr. Emily Filbert, Medical Director.             Comments: Initial ITP

## 2020-12-01 NOTE — Patient Instructions (Signed)
Patient Instructions  Patient Details  Name: Andrew Richardson MRN: [id number] Date of Birth: 07-07-1941 Referring Provider:  Isaias Cowman, MD  Below are your personal goals for exercise, nutrition, and risk factors. Our goal is to help you stay on track towards obtaining and maintaining these goals. We will be discussing your progress on these goals with you throughout the program.  Initial Exercise Prescription:  Initial Exercise Prescription - 12/01/20 1200      Date of Initial Exercise RX and Referring Provider   Date 12/01/20    Referring Provider Paraschos, Alexander MD      Treadmill   MPH 1.4    Grade 0    Minutes 15    METs 2.07      NuStep   Level 1    SPM 80    Minutes 15    METs 2      T5 Nustep   Level 1    SPM 80    Minutes 15    METs 2      Biostep-RELP   Level 1    SPM 50    Minutes 15    METs 2      Prescription Details   Frequency (times per week) 3    Duration Progress to 30 minutes of continuous aerobic without signs/symptoms of physical distress      Intensity   THRR 40-80% of Max Heartrate 87-122    Ratings of Perceived Exertion 11-13    Perceived Dyspnea 0-4      Progression   Progression Continue to progress workloads to maintain intensity without signs/symptoms of physical distress.      Resistance Training   Training Prescription Yes    Weight 4 lb    Reps 10-15           Exercise Goals: Frequency: Be able to perform aerobic exercise two to three times per week in program working toward 2-5 days per week of home exercise.  Intensity: Work with a perceived exertion of 11 (fairly light) - 15 (hard) while following your exercise prescription.  We will make changes to your prescription with you as you progress through the program.   Duration: Be able to do 30 to 45 minutes of continuous aerobic exercise in addition to a 5 minute warm-up and a 5 minute cool-down routine.   Nutrition Goals: Your personal nutrition goals  will be established when you do your nutrition analysis with the dietician.  The following are general nutrition guidelines to follow: Cholesterol < 200mg /day Sodium < 1500mg /day Fiber: Men over 50 yrs - 30 grams per day  Personal Goals:  Personal Goals and Risk Factors at Admission - 12/01/20 1217      Core Components/Risk Factors/Patient Goals on Admission    Weight Management Yes;Weight Loss    Intervention Weight Management: Develop a combined nutrition and exercise program designed to reach desired caloric intake, while maintaining appropriate intake of nutrient and fiber, sodium and fats, and appropriate energy expenditure required for the weight goal.;Weight Management: Provide education and appropriate resources to help participant work on and attain dietary goals.;Weight Management/Obesity: Establish reasonable short term and long term weight goals.    Admit Weight 187 lb 14.4 oz (85.2 kg)    Goal Weight: Short Term 182 lb (82.6 kg)    Goal Weight: Long Term 175 lb (79.4 kg)    Expected Outcomes Short Term: Continue to assess and modify interventions until short term weight is achieved;Long Term: Adherence to nutrition and  physical activity/exercise program aimed toward attainment of established weight goal;Understanding recommendations for meals to include 15-35% energy as protein, 25-35% energy from fat, 35-60% energy from carbohydrates, less than 200mg  of dietary cholesterol, 20-35 gm of total fiber daily;Understanding of distribution of calorie intake throughout the day with the consumption of 4-5 meals/snacks;Weight Loss: Understanding of general recommendations for a balanced deficit meal plan, which promotes 1-2 lb weight loss per week and includes a negative energy balance of [phone] kcal/d    Hypertension Yes    Intervention Provide education on lifestyle modifcations including regular physical activity/exercise, weight management, moderate sodium restriction and increased  consumption of fresh fruit, vegetables, and low fat dairy, alcohol moderation, and smoking cessation.;Monitor prescription use compliance.    Expected Outcomes Short Term: Continued assessment and intervention until BP is < 140/29mm HG in hypertensive participants. < 130/46mm HG in hypertensive participants with diabetes, heart failure or chronic kidney disease.;Long Term: Maintenance of blood pressure at goal levels.    Lipids Yes    Intervention Provide education and support for participant on nutrition & aerobic/resistive exercise along with prescribed medications to achieve LDL 70mg , HDL >40mg .    Expected Outcomes Short Term: Participant states understanding of desired cholesterol values and is compliant with medications prescribed. Participant is following exercise prescription and nutrition guidelines.;Long Term: Cholesterol controlled with medications as prescribed, with individualized exercise RX and with personalized nutrition plan. Value goals: LDL < 70mg , HDL > 40 mg.           Tobacco Use Initial Evaluation: Social History   Tobacco Use  Smoking Status Former Smoker  . Quit date: 05/19/1989  . Years since quitting: 31.5  Smokeless Tobacco Former Systems developer    Exercise Goals and Review:  Exercise Goals    Row Name 12/01/20 1213             Exercise Goals   Increase Physical Activity Yes       Intervention Provide advice, education, support and counseling about physical activity/exercise needs.;Develop an individualized exercise prescription for aerobic and resistive training based on initial evaluation findings, risk stratification, comorbidities and participant's personal goals.       Expected Outcomes Short Term: Attend rehab on a regular basis to increase amount of physical activity.;Long Term: Add in home exercise to make exercise part of routine and to increase amount of physical activity.;Long Term: Exercising regularly at least 3-5 days a week.       Increase Strength and  Stamina Yes       Intervention Provide advice, education, support and counseling about physical activity/exercise needs.;Develop an individualized exercise prescription for aerobic and resistive training based on initial evaluation findings, risk stratification, comorbidities and participant's personal goals.       Expected Outcomes Short Term: Increase workloads from initial exercise prescription for resistance, speed, and METs.;Short Term: Perform resistance training exercises routinely during rehab and add in resistance training at home;Long Term: Improve cardiorespiratory fitness, muscular endurance and strength as measured by increased METs and functional capacity (6MWT)       Able to understand and use rate of perceived exertion (RPE) scale Yes       Intervention Provide education and explanation on how to use RPE scale       Expected Outcomes Short Term: Able to use RPE daily in rehab to express subjective intensity level;Long Term:  Able to use RPE to guide intensity level when exercising independently       Able to understand and use Dyspnea scale  Yes       Intervention Provide education and explanation on how to use Dyspnea scale       Expected Outcomes Long Term: Able to use Dyspnea scale to guide intensity level when exercising independently;Short Term: Able to use Dyspnea scale daily in rehab to express subjective sense of shortness of breath during exertion       Knowledge and understanding of Target Heart Rate Range (THRR) Yes       Intervention Provide education and explanation of THRR including how the numbers were predicted and where they are located for reference       Expected Outcomes Short Term: Able to state/look up THRR;Short Term: Able to use daily as guideline for intensity in rehab;Long Term: Able to use THRR to govern intensity when exercising independently       Able to check pulse independently Yes       Intervention Provide education and demonstration on how to check pulse  in carotid and radial arteries.;Review the importance of being able to check your own pulse for safety during independent exercise       Expected Outcomes Short Term: Able to explain why pulse checking is important during independent exercise;Long Term: Able to check pulse independently and accurately       Understanding of Exercise Prescription Yes       Intervention Provide education, explanation, and written materials on patient's individual exercise prescription       Expected Outcomes Short Term: Able to explain program exercise prescription;Long Term: Able to explain home exercise prescription to exercise independently              Copy of goals given to participant.

## 2020-12-03 ENCOUNTER — Other Ambulatory Visit: Payer: Self-pay

## 2020-12-03 DIAGNOSIS — Z951 Presence of aortocoronary bypass graft: Secondary | ICD-10-CM

## 2020-12-03 DIAGNOSIS — Z5189 Encounter for other specified aftercare: Secondary | ICD-10-CM | POA: Diagnosis not present

## 2020-12-03 NOTE — Progress Notes (Signed)
Daily Session Note  Patient Details  Name: Andrew Richardson MRN: [id number] Date of Birth: [birth year] Referring Provider:   Flowsheet Row Cardiac Rehab from 12/01/2020 in [facility] Cardiac and Pulmonary Rehab  Referring Provider Isaias Cowman MD      Encounter Date: 12/03/2020  Check In:  Session Check In - 12/03/20 0924      Check-In   Supervising physician immediately available to respond to emergencies See telemetry face sheet for immediately available ER MD    Location [facility]-Cardiac & Pulmonary Rehab    Staff Present Birdie Sons, MPA, RN;Amanda Oletta Darter, BA, ACSM CEP, Exercise Physiologist;Jessica Luan Pulling, MA, RCEP, CCRP, CCET    Virtual Visit No    Medication changes reported     No    Fall or balance concerns reported    No    Warm-up and Cool-down Performed on first and last piece of equipment    Resistance Training Performed Yes    VAD Patient? No    PAD/SET Patient? No      Pain Assessment   Currently in Pain? No/denies              Social History   Tobacco Use  Smoking Status Former Smoker  . Quit date: 05/19/1989  . Years since quitting: 31.5  Smokeless Tobacco Former Systems developer    Goals Met:  Independence with exercise equipment Exercise tolerated well No report of cardiac concerns or symptoms Strength training completed today  Goals Unmet:  Not Applicable  Comments: First full day of exercise!  Patient was oriented to gym and equipment including functions, settings, policies, and procedures.  Patient's individual exercise prescription and treatment plan were reviewed.  All starting workloads were established based on the results of the 6 minute walk test done at initial orientation visit.  The plan for exercise progression was also introduced and progression will be customized based on patient's performance and goals.     Dr. Emily Filbert is Medical Director for [place] and [facility].

## 2020-12-05 ENCOUNTER — Other Ambulatory Visit: Payer: Self-pay

## 2020-12-05 ENCOUNTER — Encounter: Payer: Medicare Other | Admitting: *Deleted

## 2020-12-05 DIAGNOSIS — Z5189 Encounter for other specified aftercare: Secondary | ICD-10-CM | POA: Diagnosis not present

## 2020-12-05 DIAGNOSIS — Z951 Presence of aortocoronary bypass graft: Secondary | ICD-10-CM

## 2020-12-05 NOTE — Progress Notes (Signed)
Daily Session Note  Patient Details  Name: CRIXUS MCAULAY MRN: [id number] Date of Birth: 16-Oct-1940 Referring Provider:   Flowsheet Row Cardiac Rehab from 12/01/2020 in [facility] Cardiac and Pulmonary Rehab  Referring Provider Isaias Cowman MD      Encounter Date: 12/05/2020  Check In:  Session Check In - 12/05/20 0937      Check-In   Supervising physician immediately available to respond to emergencies See telemetry face sheet for immediately available ER MD    Location [facility]-Cardiac & Pulmonary Rehab    Staff Present Heath Lark, RN, BSN, CCRP;Jessica [place], MA, RCEP, CCRP, CCET;Joseph [place] RCP,RRT,BSRT    Virtual Visit No    Medication changes reported     No    Fall or balance concerns reported    No    Warm-up and Cool-down Performed on first and last piece of equipment    Resistance Training Performed Yes    VAD Patient? No    PAD/SET Patient? No      Pain Assessment   Currently in Pain? No/denies              Social History   Tobacco Use  Smoking Status Former Smoker  . Quit date: 05/19/1989  . Years since quitting: 31.5  Smokeless Tobacco Former Systems developer    Goals Met:  Independence with exercise equipment Exercise tolerated well No report of cardiac concerns or symptoms  Goals Unmet:  Not Applicable  Comments: Pt able to follow exercise prescription today without complaint.  Will continue to monitor for progression.    Dr. Emily Filbert is Medical Director for [place] and [facility].

## 2020-12-08 ENCOUNTER — Encounter: Payer: Medicare Other | Admitting: *Deleted

## 2020-12-08 ENCOUNTER — Other Ambulatory Visit: Payer: Self-pay

## 2020-12-08 DIAGNOSIS — Z951 Presence of aortocoronary bypass graft: Secondary | ICD-10-CM

## 2020-12-08 DIAGNOSIS — Z5189 Encounter for other specified aftercare: Secondary | ICD-10-CM | POA: Diagnosis not present

## 2020-12-08 NOTE — Progress Notes (Signed)
Daily Session Note  Patient Details  Name: ZAYYAN MULLEN MRN: [id number] Date of Birth: 1941-06-30 Referring Provider:   Flowsheet Row Cardiac Rehab from 12/01/2020 in [facility] Cardiac and Pulmonary Rehab  Referring Provider Isaias Cowman MD      Encounter Date: 12/08/2020  Check In:  Session Check In - 12/08/20 0939      Check-In   Supervising physician immediately available to respond to emergencies See telemetry face sheet for immediately available ER MD    Location [facility]-Cardiac & Pulmonary Rehab    Staff Present Heath Lark, RN, BSN, CCRP;Joseph Hood RCP,RRT,BSRT;Kelly [place], [place], ACSM CEP, Exercise Physiologist    Virtual Visit No    Medication changes reported     No    Fall or balance concerns reported    No    Warm-up and Cool-down Performed on first and last piece of equipment    Resistance Training Performed Yes    VAD Patient? No    PAD/SET Patient? No      Pain Assessment   Currently in Pain? No/denies              Social History   Tobacco Use  Smoking Status Former Smoker  . Quit date: 05/19/1989  . Years since quitting: 31.5  Smokeless Tobacco Former Systems developer    Goals Met:  Independence with exercise equipment Exercise tolerated well No report of cardiac concerns or symptoms  Goals Unmet:  Not Applicable  Comments: Pt able to follow exercise prescription today without complaint.  Will continue to monitor for progression.    Dr. Emily Filbert is Medical Director for [place] and [facility].

## 2020-12-10 ENCOUNTER — Other Ambulatory Visit: Payer: Self-pay

## 2020-12-10 DIAGNOSIS — Z5189 Encounter for other specified aftercare: Secondary | ICD-10-CM | POA: Diagnosis not present

## 2020-12-10 DIAGNOSIS — Z951 Presence of aortocoronary bypass graft: Secondary | ICD-10-CM

## 2020-12-10 NOTE — Progress Notes (Signed)
Daily Session Note  Patient Details  Name: Andrew Richardson MRN: [id number] Date of Birth: [birth year] Referring Provider:   Flowsheet Row Cardiac Rehab from 12/01/2020 in [facility] Cardiac and Pulmonary Rehab  Referring Provider Isaias Cowman MD      Encounter Date: 12/10/2020  Check In:  Session Check In - 12/10/20 0933      Check-In   Supervising physician immediately available to respond to emergencies See telemetry face sheet for immediately available ER MD    Location [facility]-Cardiac & Pulmonary Rehab    Staff Present Birdie Sons, MPA, RN;Joseph Foy [place], [place], ACSM CEP, Exercise Physiologist    Virtual Visit No    Medication changes reported     No    Fall or balance concerns reported    No    Warm-up and Cool-down Performed on first and last piece of equipment    Resistance Training Performed Yes    VAD Patient? No    PAD/SET Patient? No      Pain Assessment   Currently in Pain? No/denies              Social History   Tobacco Use  Smoking Status Former Smoker  . Quit date: 05/19/1989  . Years since quitting: 31.5  Smokeless Tobacco Former Systems developer    Goals Met:  Independence with exercise equipment Exercise tolerated well No report of cardiac concerns or symptoms Strength training completed today  Goals Unmet:  Not Applicable  Comments: Pt able to follow exercise prescription today without complaint.  Will continue to monitor for progression.    Dr. Emily Filbert is Medical Director for [place] and [facility].

## 2020-12-12 ENCOUNTER — Other Ambulatory Visit: Payer: Self-pay

## 2020-12-12 ENCOUNTER — Encounter: Payer: Medicare Other | Admitting: *Deleted

## 2020-12-12 DIAGNOSIS — Z5189 Encounter for other specified aftercare: Secondary | ICD-10-CM | POA: Diagnosis not present

## 2020-12-12 DIAGNOSIS — Z951 Presence of aortocoronary bypass graft: Secondary | ICD-10-CM

## 2020-12-12 NOTE — Progress Notes (Signed)
Daily Session Note  Patient Details  Name: Andrew Richardson MRN: [id number] Date of Birth: November 14, 1940 Referring Provider:   Flowsheet Row Cardiac Rehab from 12/01/2020 in [facility] Cardiac and Pulmonary Rehab  Referring Provider Isaias Cowman MD      Encounter Date: 12/12/2020  Check In:  Session Check In - 12/12/20 0936      Check-In   Supervising physician immediately available to respond to emergencies See telemetry face sheet for immediately available ER MD    Location [facility]-Cardiac & Pulmonary Rehab    Staff Present Heath Lark, RN, BSN, CCRP;Jessica Browning, MA, RCEP, CCRP, CCET;Joseph Hood RCP,RRT,BSRT;Meredith Sherryll Burger, RN BSN    Virtual Visit No    Medication changes reported     No    Fall or balance concerns reported    No    Warm-up and Cool-down Performed on first and last piece of equipment    Resistance Training Performed Yes    VAD Patient? No    PAD/SET Patient? No      Pain Assessment   Currently in Pain? No/denies              Social History   Tobacco Use  Smoking Status Former Smoker  . Quit date: 05/19/1989  . Years since quitting: 31.5  Smokeless Tobacco Former Systems developer    Goals Met:  Independence with exercise equipment Exercise tolerated well No report of cardiac concerns or symptoms  Goals Unmet:  Not Applicable  Comments: Pt able to follow exercise prescription today without complaint.  Will continue to monitor for progression.    Dr. Emily Filbert is Medical Director for [place] and [facility].

## 2020-12-15 ENCOUNTER — Other Ambulatory Visit: Payer: Self-pay

## 2020-12-15 ENCOUNTER — Encounter: Payer: Medicare Other | Admitting: *Deleted

## 2020-12-15 DIAGNOSIS — Z5189 Encounter for other specified aftercare: Secondary | ICD-10-CM | POA: Diagnosis not present

## 2020-12-15 DIAGNOSIS — Z951 Presence of aortocoronary bypass graft: Secondary | ICD-10-CM

## 2020-12-15 NOTE — Progress Notes (Signed)
Daily Session Note  Patient Details  Name: Andrew Richardson MRN: [id number] Date of Birth: 1941-05-03 Referring Provider:   Flowsheet Row Cardiac Rehab from 12/01/2020 in [facility] Cardiac and Pulmonary Rehab  Referring Provider Isaias Cowman MD      Encounter Date: 12/15/2020  Check In:  Session Check In - 12/15/20 1011      Check-In   Supervising physician immediately available to respond to emergencies See telemetry face sheet for immediately available ER MD    Location [facility]-Cardiac & Pulmonary Rehab    Staff Present Heath Lark, RN, BSN, [place]aveda Norman, BS, ACSM CEP, Exercise Physiologist;Joseph Tessie Fass RCP,RRT,BSRT    Virtual Visit No    Medication changes reported     No    Fall or balance concerns reported    No    Warm-up and Cool-down Performed on first and last piece of equipment    Resistance Training Performed Yes    VAD Patient? No    PAD/SET Patient? No      Pain Assessment   Currently in Pain? No/denies              Social History   Tobacco Use  Smoking Status Former Smoker  . Quit date: 05/19/1989  . Years since quitting: 31.5  Smokeless Tobacco Former Systems developer    Goals Met:  Independence with exercise equipment Exercise tolerated well No report of cardiac concerns or symptoms  Goals Unmet:  Not Applicable  Comments: Pt able to follow exercise prescription today without complaint.  Will continue to monitor for progression.    Dr. Emily Filbert is Medical Director for [place] and [facility].

## 2020-12-17 ENCOUNTER — Other Ambulatory Visit: Payer: Self-pay

## 2020-12-17 ENCOUNTER — Encounter: Payer: Self-pay | Admitting: *Deleted

## 2020-12-17 DIAGNOSIS — Z951 Presence of aortocoronary bypass graft: Secondary | ICD-10-CM

## 2020-12-17 DIAGNOSIS — Z5189 Encounter for other specified aftercare: Secondary | ICD-10-CM | POA: Diagnosis not present

## 2020-12-17 NOTE — Progress Notes (Signed)
Daily Session Note  Patient Details  Name: Andrew Richardson MRN: [id number] Date of Birth: [birth year] Referring Provider:   Flowsheet Row Cardiac Rehab from 12/01/2020 in [facility] Cardiac and Pulmonary Rehab  Referring Provider Isaias Cowman MD      Encounter Date: 12/17/2020  Check In:  Session Check In - 12/17/20 0929      Check-In   Supervising physician immediately available to respond to emergencies See telemetry face sheet for immediately available ER MD    Location [facility]-Cardiac & Pulmonary Rehab    Staff Present Birdie Sons, MPA, Elveria Rising, BA, ACSM CEP, Exercise Physiologist;Joseph Tessie Fass RCP,RRT,BSRT    Virtual Visit No    Medication changes reported     No    Fall or balance concerns reported    No    Warm-up and Cool-down Performed on first and last piece of equipment    Resistance Training Performed Yes    VAD Patient? No    PAD/SET Patient? No      Pain Assessment   Currently in Pain? No/denies              Social History   Tobacco Use  Smoking Status Former Smoker  . Quit date: 05/19/1989  . Years since quitting: 31.6  Smokeless Tobacco Former Systems developer    Goals Met:  Independence with exercise equipment Exercise tolerated well No report of cardiac concerns or symptoms Strength training completed today  Goals Unmet:  Not Applicable  Comments: Pt able to follow exercise prescription today without complaint.  Will continue to monitor for progression.    Dr. Emily Filbert is Medical Director for [place] and [facility].

## 2020-12-17 NOTE — Progress Notes (Signed)
Cardiac Individual Treatment Plan  Patient Details  Name: Andrew Richardson MRN: [id number] Date of Birth: July 16, 1941 Referring Provider:   Flowsheet Row Cardiac Rehab from 12/01/2020 in [facility] Cardiac and Pulmonary Rehab  Referring Provider Isaias Cowman MD      Initial Encounter Date:  Flowsheet Row Cardiac Rehab from 12/01/2020 in [facility] Cardiac and Pulmonary Rehab  Date 12/01/20      Visit Diagnosis: S/P CABG x 4  Patient's Home Medications on Admission:  Current Outpatient Medications:  .  amiodarone (PACERONE) 200 MG tablet, Take 1 tablet (200 mg total) by mouth 2 (two) times daily. For 10 days;then take 200 mg daily thereafter, Disp: 60 tablet, Rfl: 1 .  amLODipine (NORVASC) 5 MG tablet, Take 5 mg by mouth every evening., Disp: , Rfl:  .  aspirin EC 325 MG EC tablet, Take 1 tablet (325 mg total) by mouth daily., Disp: 30 tablet, Rfl: 0 .  atorvastatin (LIPITOR) 10 MG tablet, Take 10 mg by mouth every evening., Disp: , Rfl:  .  levothyroxine (SYNTHROID) 50 MCG tablet, Take 50 mcg by mouth daily., Disp: , Rfl:  .  losartan (COZAAR) 100 MG tablet, Take 100 mg by mouth every evening., Disp: , Rfl:  .  metoprolol tartrate (LOPRESSOR) 25 MG tablet, Take 1 tablet (25 mg total) by mouth 2 (two) times daily., Disp: 60 tablet, Rfl: 1 .  polyvinyl alcohol (LIQUIFILM TEARS) 1.4 % ophthalmic solution, Place 1 drop into both eyes daily as needed for dry eyes., Disp: , Rfl:  .  tamsulosin (FLOMAX) 0.4 MG CAPS capsule, Take 0.4 mg by mouth daily., Disp: , Rfl:  .  traMADol (ULTRAM) 50 MG tablet, Take 1 tablet (50 mg total) by mouth every 6 (six) hours as needed for moderate pain., Disp: 50 tablet, Rfl: 0  Past Medical History: Past Medical History:  Diagnosis Date  . Colon polyps   . Coronary artery disease   . Depression   . Headache   . Hyperlipemia   . Hypertension   . Hypothyroid   . Prediabetes   . 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 Tubular adenoma of colon      Tobacco Use: Social History   Tobacco Use  Smoking Status Former Smoker  . Quit date: 05/19/1989  . 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 Latest Ref Rng & Units 10/08/2020 10/08/2020 10/08/2020 10/08/2020 10/09/2020   Cholestrol 0 - 200 mg/dL - - - - -   LDLCALC 0 - 99 mg/dL - - - - -   HDL >40 mg/dL - - - - -   Trlycerides <150 mg/dL - - - - -   Hemoglobin A1c 4.8 - 5.6 % - - - - -   PHART 7.350 - 7.450 - 7.387 7.282(L) 7.259(L) 7.327(L)   PCO2ART 32.0 - 48.0 mmHg - 38.9 46.1 49.5(H) 40.7   HCO3 20.0 - 28.0 mmol/L - 23.5 21.8 22.1 21.1   TCO2 22 - 32 mmol/L 28 25 23 24 22    ACIDBASEDEF 0.0 - 2.0 mmol/L - 1.0 5.0(H) 5.0(H) 4.0(H)   O2SAT % - 100.0 97.0 98.0 97.0       Exercise Target Goals: Exercise Program Goal: Individual exercise prescription set using results from initial 6 min walk test and THRR while considering  patient's activity barriers and safety.   Exercise Prescription Goal: Initial exercise prescription builds to  30-45 minutes a day of aerobic activity, 2-3 days per week.  Home exercise guidelines will be given to patient during program as part of exercise prescription that the participant will acknowledge.   Education: Aerobic Exercise: - Group verbal and visual presentation on the components of exercise prescription. Introduces F.I.T.T principle from ACSM for exercise prescriptions.  Reviews F.I.T.T. principles of aerobic exercise including progression. Written material given at graduation. Flowsheet Row Cardiac Rehab from 12/10/2020 in [facility] Cardiac and Pulmonary Rehab  Education need identified 12/01/20      Education: Resistance Exercise: - Group verbal and visual presentation on the components of exercise prescription. Introduces F.I.T.T principle from ACSM for exercise prescriptions  Reviews F.I.T.T. principles of resistance exercise including  progression. Written material given at graduation.    Education: Exercise & Equipment Safety: - Individual verbal instruction and demonstration of equipment use and safety with use of the equipment. Flowsheet Row Cardiac Rehab from 12/10/2020 in [facility] Cardiac and Pulmonary Rehab  Date 12/01/20  Educator [facility]  Instruction Review Code 1- Verbalizes Understanding      Education: Exercise Physiology & General Exercise Guidelines: - Group verbal and written instruction with models to review the exercise physiology of the cardiovascular system and associated critical values. Provides general exercise guidelines with specific guidelines to those with heart or lung disease.    Education: Flexibility, Balance, Mind/Body Relaxation: - Group verbal and visual presentation with interactive activity on the components of exercise prescription. Introduces F.I.T.T principle from ACSM for exercise prescriptions. Reviews F.I.T.T. principles of flexibility and balance exercise training including progression. Also discusses the mind body connection.  Reviews various relaxation techniques to help reduce and manage stress (i.e. Deep breathing, progressive muscle relaxation, and visualization). Balance handout provided to take home. Written material given at graduation.   Activity Barriers & Risk Stratification:  Activity Barriers & Cardiac Risk Stratification - 12/01/20 1209      Activity Barriers & Cardiac Risk Stratification   Activity Barriers Incisional Pain;Deconditioning;Muscular Weakness;Balance Concerns    Cardiac Risk Stratification High           6 Minute Walk:  6 Minute Walk    Row Name 12/01/20 1207         6 Minute Walk   Phase Initial     Distance 785 feet     Walk Time 6 minutes     # of Rest Breaks 0     MPH 1.49     METS 1.17     RPE 13     Perceived Dyspnea  1     VO2 Peak 4.09     Symptoms Yes (comment)     Comments some leg weakness 1/10, using handrail     Resting HR 52  bpm     Resting BP 132/62     Resting Oxygen Saturation  97 %     Exercise Oxygen Saturation  during 6 min walk 96 %     Max Ex. HR 83 bpm     Max Ex. BP 136/76     2 Minute Post BP 132/74            Oxygen Initial Assessment:   Oxygen Re-Evaluation:   Oxygen Discharge (Final Oxygen Re-Evaluation):   Initial Exercise Prescription:  Initial Exercise Prescription - 12/01/20 1200      Date of Initial Exercise RX and Referring Provider   Date 12/01/20    Referring Provider Isaias Cowman MD      Treadmill   MPH 1.4  Grade 0    Minutes 15    METs 2.07      NuStep   Level 1    SPM 80    Minutes 15    METs 2      T5 Nustep   Level 1    SPM 80    Minutes 15    METs 2      Biostep-RELP   Level 1    SPM 50    Minutes 15    METs 2      Prescription Details   Frequency (times per week) 3    Duration Progress to 30 minutes of continuous aerobic without signs/symptoms of physical distress      Intensity   THRR 40-80% of Max Heartrate 87-122    Ratings of Perceived Exertion 11-13    Perceived Dyspnea 0-4      Progression   Progression Continue to progress workloads to maintain intensity without signs/symptoms of physical distress.      Resistance Training   Training Prescription Yes    Weight 4 lb    Reps 10-15           Perform Capillary Blood Glucose checks as needed.  Exercise Prescription Changes:  Exercise Prescription Changes    Row Name 12/01/20 1200 12/16/20 1500           Response to Exercise   Blood Pressure (Admit) 132/62 110/60      Blood Pressure (Exercise) 136/76 130/70      Blood Pressure (Exit) 132/74 102/52      Heart Rate (Admit) 52 bpm 54 bpm      Heart Rate (Exercise) 83 bpm 86 bpm      Heart Rate (Exit) 54 bpm 57 bpm      Oxygen Saturation (Admit) 97 % --      Oxygen Saturation (Exercise) 96 % --      Rating of Perceived Exertion (Exercise) 13 12      Perceived Dyspnea (Exercise) 1 --      Symptoms leg weakness  1/10 none      Comments walk test results --      Duration -- Continue with 30 min of aerobic exercise without signs/symptoms of physical distress.      Intensity -- THRR unchanged             Progression   Progression -- Continue to progress workloads to maintain intensity without signs/symptoms of physical distress.      Average METs -- 2.37             Resistance Training   Training Prescription -- Yes      Weight -- 4 lb      Reps -- 10-15             Interval Training   Interval Training -- No             Treadmill   MPH -- 1.4      Grade -- 0      Minutes -- 15      METs -- 2.07             NuStep   Level -- 3      Minutes -- 15      METs -- 2.4             T5 Nustep   Level -- 3      Minutes -- 15      METs -- 2  Biostep-RELP   Level -- 3      Minutes -- 15      METs -- 3             Exercise Comments:  Exercise Comments    Row Name 12/03/20 0925           Exercise Comments First full day of exercise!  Patient was oriented to gym and equipment including functions, settings, policies, and procedures.  Patient's individual exercise prescription and treatment plan were reviewed.  All starting workloads were established based on the results of the 6 minute walk test done at initial orientation visit.  The plan for exercise progression was also introduced and progression will be customized based on patient's performance and goals.              Exercise Goals and Review:  Exercise Goals    Row Name 12/01/20 1213             Exercise Goals   Increase Physical Activity Yes       Intervention Provide advice, education, support and counseling about physical activity/exercise needs.;Develop an individualized exercise prescription for aerobic and resistive training based on initial evaluation findings, risk stratification, comorbidities and participant's personal goals.       Expected Outcomes Short Term: Attend rehab on a regular basis  to increase amount of physical activity.;Long Term: Add in home exercise to make exercise part of routine and to increase amount of physical activity.;Long Term: Exercising regularly at least 3-5 days a week.       Increase Strength and Stamina Yes       Intervention Provide advice, education, support and counseling about physical activity/exercise needs.;Develop an individualized exercise prescription for aerobic and resistive training based on initial evaluation findings, risk stratification, comorbidities and participant's personal goals.       Expected Outcomes Short Term: Increase workloads from initial exercise prescription for resistance, speed, and METs.;Short Term: Perform resistance training exercises routinely during rehab and add in resistance training at home;Long Term: Improve cardiorespiratory fitness, muscular endurance and strength as measured by increased METs and functional capacity (6MWT)       Able to understand and use rate of perceived exertion (RPE) scale Yes       Intervention Provide education and explanation on how to use RPE scale       Expected Outcomes Short Term: Able to use RPE daily in rehab to express subjective intensity level;Long Term:  Able to use RPE to guide intensity level when exercising independently       Able to understand and use Dyspnea scale Yes       Intervention Provide education and explanation on how to use Dyspnea scale       Expected Outcomes Long Term: Able to use Dyspnea scale to guide intensity level when exercising independently;Short Term: Able to use Dyspnea scale daily in rehab to express subjective sense of shortness of breath during exertion       Knowledge and understanding of Target Heart Rate Range (THRR) Yes       Intervention Provide education and explanation of THRR including how the numbers were predicted and where they are located for reference       Expected Outcomes Short Term: Able to state/look up THRR;Short Term: Able to use  daily as guideline for intensity in rehab;Long Term: Able to use THRR to govern intensity when exercising independently       Able to check pulse independently Yes  Intervention Provide education and demonstration on how to check pulse in carotid and radial arteries.;Review the importance of being able to check your own pulse for safety during independent exercise       Expected Outcomes Short Term: Able to explain why pulse checking is important during independent exercise;Long Term: Able to check pulse independently and accurately       Understanding of Exercise Prescription Yes       Intervention Provide education, explanation, and written materials on patient's individual exercise prescription       Expected Outcomes Short Term: Able to explain program exercise prescription;Long Term: Able to explain home exercise prescription to exercise independently              Exercise Goals Re-Evaluation :  Exercise Goals Re-Evaluation    Row Name 12/03/20 0925 12/16/20 1527           Exercise Goal Re-Evaluation   Exercise Goals Review Increase Physical Activity;Able to understand and use rate of perceived exertion (RPE) scale;Knowledge and understanding of Target Heart Rate Range (THRR);Understanding of Exercise Prescription;Increase Strength and Stamina;Able to understand and use Dyspnea scale;Able to check pulse independently Increase Physical Activity;Increase Strength and Stamina;Understanding of Exercise Prescription      Comments Reviewed RPE and dyspnea scales, THR and program prescription with pt today.  Pt voiced understanding and was given a copy of goals to take home. Timmothy Sours has been doing well in rehab.  He is up to level 3 on the seated equipment.  We will continue to monitor his porgress.      Expected Outcomes Short: Use RPE daily to regulate intensity. Long: Follow program prescription in THR. Short: Try to add incline to treadmill Long: Continue to improve stamina              Discharge Exercise Prescription (Final Exercise Prescription Changes):  Exercise Prescription Changes - 12/16/20 1500      Response to Exercise   Blood Pressure (Admit) 110/60    Blood Pressure (Exercise) 130/70    Blood Pressure (Exit) 102/52    Heart Rate (Admit) 54 bpm    Heart Rate (Exercise) 86 bpm    Heart Rate (Exit) 57 bpm    Rating of Perceived Exertion (Exercise) 12    Symptoms none    Duration Continue with 30 min of aerobic exercise without signs/symptoms of physical distress.    Intensity THRR unchanged      Progression   Progression Continue to progress workloads to maintain intensity without signs/symptoms of physical distress.    Average METs 2.37      Resistance Training   Training Prescription Yes    Weight 4 lb    Reps 10-15      Interval Training   Interval Training No      Treadmill   MPH 1.4    Grade 0    Minutes 15    METs 2.07      NuStep   Level 3    Minutes 15    METs 2.4      T5 Nustep   Level 3    Minutes 15    METs 2      Biostep-RELP   Level 3    Minutes 15    METs 3           Nutrition:  Target Goals: Understanding of nutrition guidelines, daily intake of sodium 1500mg , cholesterol 200mg , calories 30% from fat and 7% or less from saturated fats, daily to have  5 or more servings of fruits and vegetables.  Education: All About Nutrition: -Group instruction provided by verbal, written material, interactive activities, discussions, models, and posters to present general guidelines for heart healthy nutrition including fat, fiber, MyPlate, the role of sodium in heart healthy nutrition, utilization of the nutrition label, and utilization of this knowledge for meal planning. Follow up email sent as well. Written material given at graduation.   Biometrics:  Pre Biometrics - 12/01/20 1213      Pre Biometrics   Height 5' 7.25" (1.708 m)    Weight 187 lb 14.4 oz (85.2 kg)    BMI (Calculated) 29.22    Single Leg Stand 1.2  seconds            Nutrition Therapy Plan and Nutrition Goals:  Nutrition Therapy & Goals - 12/01/20 1214      Intervention Plan   Intervention Prescribe, educate and counsel regarding individualized specific dietary modifications aiming towards targeted core components such as weight, hypertension, lipid management, diabetes, heart failure and other comorbidities.;Nutrition handout(s) given to patient.    Expected Outcomes Short Term Goal: Understand basic principles of dietary content, such as calories, fat, sodium, cholesterol and nutrients.;Short Term Goal: A plan has been developed with personal nutrition goals set during dietitian appointment.;Long Term Goal: Adherence to prescribed nutrition plan.           Nutrition Assessments:  MEDIFICTS Score Key:  ?70 Need to make dietary changes   40-70 Heart Healthy Diet  ? 40 Therapeutic Level Cholesterol Diet  Flowsheet Row Cardiac Rehab from 12/01/2020 in [facility] Cardiac and Pulmonary Rehab  Picture Your Plate Total Score on Admission 75     Picture Your Plate Scores:  <44 Unhealthy dietary pattern with much room for improvement.  41-50 Dietary pattern unlikely to meet recommendations for good health and room for improvement.  51-60 More healthful dietary pattern, with some room for improvement.   >60 Healthy dietary pattern, although there may be some specific behaviors that could be improved.    Nutrition Goals Re-Evaluation:   Nutrition Goals Discharge (Final Nutrition Goals Re-Evaluation):   Psychosocial: Target Goals: Acknowledge presence or absence of significant depression and/or stress, maximize coping skills, provide positive support system. Participant is able to verbalize types and ability to use techniques and skills needed for reducing stress and depression.   Education: Stress, Anxiety, and Depression - Group verbal and visual presentation to define topics covered.  Reviews how body is impacted by stress,  anxiety, and depression.  Also discusses healthy ways to reduce stress and to treat/manage anxiety and depression.  Written material given at graduation.   Education: Sleep Hygiene -Provides group verbal and written instruction about how sleep can affect your health.  Define sleep hygiene, discuss sleep cycles and impact of sleep habits. Review good sleep hygiene tips.    Initial Review & Psychosocial Screening:  Initial Psych Review & Screening - 11/19/20 1405      Initial Review   Current issues with Current Abuse or Neglect to Report;Current Anxiety/Panic;Current Stress Concerns    Source of Stress Concerns Unable to participate in former interests or hobbies;Unable to perform yard/household activities      [place]? Yes   wife and good friends     Barriers   Psychosocial barriers to participate in program There are no identifiable barriers or psychosocial needs.;The patient should benefit from training in stress management and relaxation.      Screening Interventions  Interventions Encouraged to exercise;Provide feedback about the scores to participant;To provide support and resources with identified psychosocial needs    Expected Outcomes Short Term goal: Utilizing psychosocial counselor, staff and physician to assist with identification of specific Stressors or current issues interfering with healing process. Setting desired goal for each stressor or current issue identified.;Long Term Goal: Stressors or current issues are controlled or eliminated.;Short Term goal: Identification and review with participant of any Quality of Life or Depression concerns found by scoring the questionnaire.;Long Term goal: The participant improves quality of Life and PHQ9 Scores as seen by post scores and/or verbalization of changes           Quality of Life Scores:   Quality of Life - 12/01/20 1214      Quality of Life   Select Quality of Life      Quality of Life  Scores   Health/Function Pre 17.27 %    Socioeconomic Pre 25 %    Psych/Spiritual Pre 14.71 %    Family Pre 23.13 %    GLOBAL Pre 19.08 %          Scores of 19 and below usually indicate a poorer quality of life in these areas.  A difference of  2-3 points is a clinically meaningful difference.  A difference of 2-3 points in the total score of the Quality of Life Index has been associated with significant improvement in overall quality of life, self-image, physical symptoms, and general health in studies assessing change in quality of life.  PHQ-9: Recent Review Flowsheet Data    Depression screen [facility] 2/9 12/01/2020   Decreased Interest 1   Down, Depressed, Hopeless 1   PHQ - 2 Score 2   Altered sleeping 0   Tired, decreased energy 1   Change in appetite 1   Feeling bad or failure about yourself  2   Trouble concentrating 2   Moving slowly or fidgety/restless 1   Suicidal thoughts 1   PHQ-9 Score 10   Difficult doing work/chores Somewhat difficult     Interpretation of Total Score  Total Score Depression Severity:  1-4 = Minimal depression, 5-9 = Mild depression, 10-14 = Moderate depression, 15-19 = Moderately severe depression, 20-27 = Severe depression   Psychosocial Evaluation and Intervention:  Psychosocial Evaluation - 11/19/20 1415      Psychosocial Evaluation & Interventions   Interventions Encouraged to exercise with the program and follow exercise prescription;Relaxation education    Comments Mr. Brittian reports healing well after his CABG x 4. His wife and friends have helped take care of him and let him heal well. He was quite active before his surgery and hopes to gain back some of his stamina. He wants to maintain independent as long as possible. He did share that his chart says he has a history of depression but he doesn't remember ever being diagnosed with it. He does state that his anxiety has increased since surgery but is getting better. Sometimes he wakes up  anxious and worrying about life but he focuses on relaxing and taking deep breaths. He wants to attend cardiac rehab to boost his stamina and gain knowledge to help maintain a heart healthy lifestyle.    Expected Outcomes Short: attend cardiac rehab for education and exercise. Long: develop and maintain positive self care habits.    Continue Psychosocial Services  Follow up required by staff           Psychosocial Re-Evaluation:   Psychosocial Discharge (Final  Psychosocial Re-Evaluation):   Vocational Rehabilitation: Provide vocational rehab assistance to qualifying candidates.   Vocational Rehab Evaluation & Intervention:  Vocational Rehab - 11/19/20 1415      Initial Vocational Rehab Evaluation & Intervention   Assessment shows need for Vocational Rehabilitation No           Education: Education Goals: Education classes will be provided on a variety of topics geared toward better understanding of heart health and risk factor modification. Participant will state understanding/return demonstration of topics presented as noted by education test scores.  Learning Barriers/Preferences:  Learning Barriers/Preferences - 11/19/20 1415      Learning Barriers/Preferences   Learning Barriers Hearing    Learning Preferences None           General Cardiac Education Topics:  AED/CPR: - Group verbal and written instruction with the use of models to demonstrate the basic use of the AED with the basic ABC's of resuscitation.   Anatomy and Cardiac Procedures: - Group verbal and visual presentation and models provide information about basic cardiac anatomy and function. Reviews the testing methods done to diagnose heart disease and the outcomes of the test results. Describes the treatment choices: Medical Management, Angioplasty, or Coronary Bypass Surgery for treating various heart conditions including Myocardial Infarction, Angina, Valve Disease, and Cardiac Arrhythmias.  Written  material given at graduation.   Medication Safety: - Group verbal and visual instruction to review commonly prescribed medications for heart and lung disease. Reviews the medication, class of the drug, and side effects. Includes the steps to properly store meds and maintain the prescription regimen.  Written material given at graduation. Flowsheet Row Cardiac Rehab from 12/10/2020 in [facility] Cardiac and Pulmonary Rehab  Date 12/10/20  Educator SB  Instruction Review Code 1- Verbalizes Understanding      Intimacy: - Group verbal instruction through game format to discuss how heart and lung disease can affect sexual intimacy. Written material given at graduation..   Know Your Numbers and Heart Failure: - Group verbal and visual instruction to discuss disease risk factors for cardiac and pulmonary disease and treatment options.  Reviews associated critical values for Overweight/Obesity, Hypertension, Cholesterol, and Diabetes.  Discusses basics of heart failure: signs/symptoms and treatments.  Introduces Heart Failure Zone chart for action plan for heart failure.  Written material given at graduation.   Infection Prevention: - Provides verbal and written material to individual with discussion of infection control including proper hand washing and proper equipment cleaning during exercise session. Flowsheet Row Cardiac Rehab from 12/10/2020 in [facility] Cardiac and Pulmonary Rehab  Date 12/01/20  Educator [facility]  Instruction Review Code 1- Verbalizes Understanding      Falls Prevention: - Provides verbal and written material to individual with discussion of falls prevention and safety. Flowsheet Row Cardiac Rehab from 12/10/2020 in [facility] Cardiac and Pulmonary Rehab  Date 12/01/20  Educator [facility]  Instruction Review Code 1- Verbalizes Understanding      Other: -Provides group and verbal instruction on various topics (see comments)   Knowledge Questionnaire Score:  Knowledge Questionnaire Score -  12/01/20 1217      Knowledge Questionnaire Score   Pre Score 24/26 Education Focus: Pulse, exercise           Core Components/Risk Factors/Patient Goals at Admission:  Personal Goals and Risk Factors at Admission - 12/01/20 1217      Core Components/Risk Factors/Patient Goals on Admission    Weight Management Yes;Weight Loss    Intervention Weight Management: Develop a combined nutrition  and exercise program designed to reach desired caloric intake, while maintaining appropriate intake of nutrient and fiber, sodium and fats, and appropriate energy expenditure required for the weight goal.;Weight Management: Provide education and appropriate resources to help participant work on and attain dietary goals.;Weight Management/Obesity: Establish reasonable short term and long term weight goals.    Admit Weight 187 lb 14.4 oz (85.2 kg)    Goal Weight: Short Term 182 lb (82.6 kg)    Goal Weight: Long Term 175 lb (79.4 kg)    Expected Outcomes Short Term: Continue to assess and modify interventions until short term weight is achieved;Long Term: Adherence to nutrition and physical activity/exercise program aimed toward attainment of established weight goal;Understanding recommendations for meals to include 15-35% energy as protein, 25-35% energy from fat, 35-60% energy from carbohydrates, less than 200mg  of dietary cholesterol, 20-35 gm of total fiber daily;Understanding of distribution of calorie intake throughout the day with the consumption of 4-5 meals/snacks;Weight Loss: Understanding of general recommendations for a balanced deficit meal plan, which promotes 1-2 lb weight loss per week and includes a negative energy balance of [phone] kcal/d    Hypertension Yes    Intervention Provide education on lifestyle modifcations including regular physical activity/exercise, weight management, moderate sodium restriction and increased consumption of fresh fruit, vegetables, and low fat dairy, alcohol  moderation, and smoking cessation.;Monitor prescription use compliance.    Expected Outcomes Short Term: Continued assessment and intervention until BP is < 140/1mm HG in hypertensive participants. < 130/55mm HG in hypertensive participants with diabetes, heart failure or chronic kidney disease.;Long Term: Maintenance of blood pressure at goal levels.    Lipids Yes    Intervention Provide education and support for participant on nutrition & aerobic/resistive exercise along with prescribed medications to achieve LDL 70mg , HDL >40mg .    Expected Outcomes Short Term: Participant states understanding of desired cholesterol values and is compliant with medications prescribed. Participant is following exercise prescription and nutrition guidelines.;Long Term: Cholesterol controlled with medications as prescribed, with individualized exercise RX and with personalized nutrition plan. Value goals: LDL < 70mg , HDL > 40 mg.           Education:Diabetes - Individual verbal and written instruction to review signs/symptoms of diabetes, desired ranges of glucose level fasting, after meals and with exercise. Acknowledge that pre and post exercise glucose checks will be done for 3 sessions at entry of program.   Core Components/Risk Factors/Patient Goals Review:    Core Components/Risk Factors/Patient Goals at Discharge (Final Review):    ITP Comments:  ITP Comments    Row Name 11/19/20 1419 12/01/20 1207 12/03/20 0925 12/17/20 0717     ITP Comments Initial telephone orientation completed. Diagnosis can be found in CHL 1/10. EP orientation scheduled for Monday 3/7 at 10am. Completed 6MWT and gym orientation. Initial ITP created and sent for review to Dr. Emily Filbert, Medical Director. First full day of exercise!  Patient was oriented to gym and equipment including functions, settings, policies, and procedures.  Patient's individual exercise prescription and treatment plan were reviewed.  All starting  workloads were established based on the results of the 6 minute walk test done at initial orientation visit.  The plan for exercise progression was also introduced and progression will be customized based on patient's performance and goals. 30 Day review completed. Medical Director ITP review done, changes made as directed, and signed approval by Medical Director.           Comments:

## 2020-12-19 ENCOUNTER — Encounter: Payer: Medicare Other | Admitting: *Deleted

## 2020-12-19 ENCOUNTER — Other Ambulatory Visit: Payer: Self-pay

## 2020-12-19 DIAGNOSIS — Z951 Presence of aortocoronary bypass graft: Secondary | ICD-10-CM

## 2020-12-19 DIAGNOSIS — Z5189 Encounter for other specified aftercare: Secondary | ICD-10-CM | POA: Diagnosis not present

## 2020-12-19 NOTE — Progress Notes (Signed)
Daily Session Note  Patient Details  Name: Andrew Richardson MRN: [id number] Date of Birth: 09/10/1941 Referring Provider:   Flowsheet Row Cardiac Rehab from 12/01/2020 in [facility] Cardiac and Pulmonary Rehab  Referring Provider Paraschos, Alexander MD      Encounter Date: 12/19/2020  Check In:  Session Check In - 12/19/20 0936      Check-In   Supervising physician immediately available to respond to emergencies See telemetry face sheet for immediately available ER MD    Location [facility]-Cardiac & Pulmonary Rehab    Staff Present Susanne Bice, RN, BSN, CCRP;Jessica Hawkins, MA, RCEP, CCRP, CCET;Joseph Hood RCP,RRT,BSRT    Virtual Visit No    Medication changes reported     No    Fall or balance concerns reported    No    Warm-up and Cool-down Performed on first and last piece of equipment    Resistance Training Performed Yes    VAD Patient? No    PAD/SET Patient? No      Pain Assessment   Currently in Pain? No/denies              Social History   Tobacco Use  Smoking Status Former Smoker  . Quit date: 05/19/1989  . Years since quitting: 31.6  Smokeless Tobacco Former User    Goals Met:  Independence with exercise equipment Exercise tolerated well No report of cardiac concerns or symptoms  Goals Unmet:  Not Applicable  Comments: Pt able to follow exercise prescription today without complaint.  Will continue to monitor for progression.    Dr. Mark Miller is Medical Director for [facility] and [facility]. 

## 2020-12-22 ENCOUNTER — Encounter: Payer: Medicare Other | Admitting: *Deleted

## 2020-12-22 ENCOUNTER — Other Ambulatory Visit: Payer: Self-pay

## 2020-12-22 DIAGNOSIS — Z951 Presence of aortocoronary bypass graft: Secondary | ICD-10-CM

## 2020-12-22 DIAGNOSIS — Z5189 Encounter for other specified aftercare: Secondary | ICD-10-CM | POA: Diagnosis not present

## 2020-12-22 NOTE — Progress Notes (Signed)
Daily Session Note  Patient Details  Name: Andrew Richardson MRN: [id number] Date of Birth: 16-May-1941 Referring Provider:   Flowsheet Row Cardiac Rehab from 12/01/2020 in [facility] Cardiac and Pulmonary Rehab  Referring Provider Isaias Cowman MD      Encounter Date: 12/22/2020  Check In:  Session Check In - 12/22/20 0957      Check-In   Supervising physician immediately available to respond to emergencies See telemetry face sheet for immediately available ER MD    Location [facility]-Cardiac & Pulmonary Rehab    Staff Present Heath Lark, RN, BSN, CCRP;Joseph Hood RCP,RRT,BSRT;Kelly Hickman, [place], ACSM CEP, Exercise Physiologist    Virtual Visit No    Medication changes reported     No    Fall or balance concerns reported    No    Warm-up and Cool-down Performed on first and last piece of equipment    Resistance Training Performed Yes    VAD Patient? No    PAD/SET Patient? No      Pain Assessment   Currently in Pain? No/denies              Social History   Tobacco Use  Smoking Status Former Smoker  . Quit date: 05/19/1989  . Years since quitting: 31.6  Smokeless Tobacco Former Systems developer    Goals Met:  Independence with exercise equipment Exercise tolerated well No report of cardiac concerns or symptoms  Goals Unmet:  Not Applicable  Comments: Pt able to follow exercise prescription today without complaint.  Will continue to monitor for progression.    Dr. Emily Filbert is Medical Director for [place] and [facility].

## 2020-12-24 ENCOUNTER — Other Ambulatory Visit: Payer: Self-pay

## 2020-12-24 DIAGNOSIS — Z951 Presence of aortocoronary bypass graft: Secondary | ICD-10-CM

## 2020-12-24 DIAGNOSIS — Z5189 Encounter for other specified aftercare: Secondary | ICD-10-CM | POA: Diagnosis not present

## 2020-12-24 NOTE — Progress Notes (Signed)
Daily Session Note  Patient Details  Name: Andrew Richardson MRN: [id number] Date of Birth: [birth year] Referring Provider:   Flowsheet Row Cardiac Rehab from 12/01/2020 in [facility] Cardiac and Pulmonary Rehab  Referring Provider Isaias Cowman MD      Encounter Date: 12/24/2020  Check In:  Session Check In - 12/24/20 0933      Check-In   Supervising physician immediately available to respond to emergencies See telemetry face sheet for immediately available ER MD    Location [facility]-Cardiac & Pulmonary Rehab    Staff Present Birdie Sons, MPA, RN;Melissa Caiola RDN, Rowe Pavy, BA, ACSM CEP, Exercise Physiologist    Virtual Visit No    Medication changes reported     No    Fall or balance concerns reported    No    Warm-up and Cool-down Performed on first and last piece of equipment    Resistance Training Performed Yes    VAD Patient? No    PAD/SET Patient? No      Pain Assessment   Currently in Pain? No/denies              Social History   Tobacco Use  Smoking Status Former Smoker  . Quit date: 05/19/1989  . Years since quitting: 31.6  Smokeless Tobacco Former Systems developer    Goals Met:  Independence with exercise equipment Exercise tolerated well No report of cardiac concerns or symptoms Strength training completed today  Goals Unmet:  Not Applicable  Comments: Pt able to follow exercise prescription today without complaint.  Will continue to monitor for progression.    Dr. Emily Filbert is Medical Director for [place] and [facility].

## 2020-12-26 ENCOUNTER — Encounter: Payer: Medicare Other | Attending: Cardiology | Admitting: *Deleted

## 2020-12-26 ENCOUNTER — Other Ambulatory Visit: Payer: Self-pay

## 2020-12-26 DIAGNOSIS — Z951 Presence of aortocoronary bypass graft: Secondary | ICD-10-CM | POA: Insufficient documentation

## 2020-12-26 NOTE — Progress Notes (Signed)
Daily Session Note  Patient Details  Name: Andrew Richardson MRN: [id number] Date of Birth: 07-07-1941 Referring Provider:   Flowsheet Row Cardiac Rehab from 12/01/2020 in [facility] Cardiac and Pulmonary Rehab  Referring Provider Isaias Cowman MD      Encounter Date: 12/26/2020  Check In:  Session Check In - 12/26/20 1006      Check-In   Supervising physician immediately available to respond to emergencies See telemetry face sheet for immediately available ER MD    Location [facility]-Cardiac & Pulmonary Rehab    Staff Present Heath Lark, RN, BSN, CCRP;Jessica Baldwin, MA, RCEP, CCRP, CCET;Joseph Springer RCP,RRT,BSRT    Virtual Visit No    Medication changes reported     No    Fall or balance concerns reported    No    Warm-up and Cool-down Performed on first and last piece of equipment    Resistance Training Performed Yes    VAD Patient? No    PAD/SET Patient? No      Pain Assessment   Currently in Pain? No/denies              Social History   Tobacco Use  Smoking Status Former Smoker  . Quit date: 05/19/1989  . Years since quitting: 31.6  Smokeless Tobacco Former Systems developer    Goals Met:  Independence with exercise equipment Exercise tolerated well No report of cardiac concerns or symptoms  Goals Unmet:  Not Applicable  Comments: Pt able to follow exercise prescription today without complaint.  Will continue to monitor for progression.    Dr. Emily Filbert is Medical Director for [place] and [facility].

## 2020-12-31 ENCOUNTER — Other Ambulatory Visit: Payer: Self-pay

## 2020-12-31 DIAGNOSIS — Z951 Presence of aortocoronary bypass graft: Secondary | ICD-10-CM

## 2020-12-31 NOTE — Progress Notes (Signed)
Daily Session Note  Patient Details  Name: Mckenzie A Seyller MRN: [id number] Date of Birth: 01/15/1941 Referring Provider:   Flowsheet Row Cardiac Rehab from 12/01/2020 in [facility] Cardiac and Pulmonary Rehab  Referring Provider Paraschos, Alexander MD      Encounter Date: 12/31/2020  Check In:  Session Check In - 12/31/20 0927      Check-In   Supervising physician immediately available to respond to emergencies See telemetry face sheet for immediately available ER MD    Location [facility]-Cardiac & Pulmonary Rehab    Staff Present Kelly Bollinger, MPA, RN;Amanda Sommer, BA, ACSM CEP, Exercise Physiologist;Joseph Hood RCP,RRT,BSRT    Virtual Visit No    Medication changes reported     No    Fall or balance concerns reported    No    Warm-up and Cool-down Performed on first and last piece of equipment    Resistance Training Performed Yes    VAD Patient? No    PAD/SET Patient? No      Pain Assessment   Currently in Pain? No/denies              Social History   Tobacco Use  Smoking Status Former Smoker  . Quit date: 05/19/1989  . Years since quitting: 31.6  Smokeless Tobacco Former User    Goals Met:  Independence with exercise equipment Exercise tolerated well No report of cardiac concerns or symptoms Strength training completed today  Goals Unmet:  Not Applicable  Comments: Pt able to follow exercise prescription today without complaint.  Will continue to monitor for progression.    Dr. Mark Miller is Medical Director for [facility] and [facility]. 

## 2021-01-02 ENCOUNTER — Encounter: Payer: Medicare Other | Admitting: *Deleted

## 2021-01-02 ENCOUNTER — Other Ambulatory Visit: Payer: Self-pay

## 2021-01-02 DIAGNOSIS — Z951 Presence of aortocoronary bypass graft: Secondary | ICD-10-CM

## 2021-01-02 NOTE — Progress Notes (Signed)
Daily Session Note  Patient Details  Name: Andrew Richardson MRN: [id number] Date of Birth: [birth year] Referring Provider:   Flowsheet Row Cardiac Rehab from 12/01/2020 in [facility] Cardiac and Pulmonary Rehab  Referring Provider Isaias Cowman MD      Encounter Date: 01/02/2021  Check In:  Session Check In - 01/02/21 0947      Check-In   Supervising physician immediately available to respond to emergencies See telemetry face sheet for immediately available ER MD    Location [facility]-Cardiac & Pulmonary Rehab    Staff Present Renita Papa, RN BSN;Joseph [address] [place], [place], RCEP, CCRP, CCET    Virtual Visit No    Medication changes reported     No    Fall or balance concerns reported    No    Warm-up and Cool-down Performed on first and last piece of equipment    Resistance Training Performed Yes    VAD Patient? No    PAD/SET Patient? No      Pain Assessment   Currently in Pain? No/denies              Social History   Tobacco Use  Smoking Status Former Smoker  . Quit date: 05/19/1989  . Years since quitting: 31.6  Smokeless Tobacco Former Environmental health practitioner Met:  Independence with exercise equipment Exercise tolerated well Personal goals reviewed No report of cardiac concerns or symptoms Strength training completed today  Goals Unmet:  Not Applicable  Comments: Pt able to follow exercise prescription today without complaint.  Will continue to monitor for progression.    Dr. Emily Filbert is Medical Director for [place] and [facility].

## 2021-01-05 ENCOUNTER — Encounter: Payer: Medicare Other | Admitting: *Deleted

## 2021-01-05 ENCOUNTER — Other Ambulatory Visit: Payer: Self-pay

## 2021-01-05 DIAGNOSIS — Z951 Presence of aortocoronary bypass graft: Secondary | ICD-10-CM

## 2021-01-05 NOTE — Progress Notes (Signed)
Daily Session Note  Patient Details  Name: Andrew Richardson MRN: [id number] Date of Birth: 04/28/1941 Referring Provider:   Flowsheet Row Cardiac Rehab from 12/01/2020 in [facility] Cardiac and Pulmonary Rehab  Referring Provider Isaias Cowman MD      Encounter Date: 01/05/2021  Check In:  Session Check In - 01/05/21 1139      Check-In   Supervising physician immediately available to respond to emergencies See telemetry face sheet for immediately available ER MD    Location [facility]-Cardiac & Pulmonary Rehab    Staff Present Justin Mend RCP,RRT,BSRT;Heath Lark, RN, BSN, [employer], MPA, Mauricia Area, BS, ACSM CEP, Exercise Physiologist    Virtual Visit No    Medication changes reported     No    Fall or balance concerns reported    No    Warm-up and Cool-down Performed on first and last piece of equipment    Resistance Training Performed Yes    VAD Patient? No    PAD/SET Patient? No      Pain Assessment   Currently in Pain? No/denies              Social History   Tobacco Use  Smoking Status Former Smoker  . Quit date: 05/19/1989  . Years since quitting: 31.6  Smokeless Tobacco Former Systems developer    Goals Met:  Independence with exercise equipment Exercise tolerated well No report of cardiac concerns or symptoms Strength training completed today  Goals Unmet:  Not Applicable  Comments: Pt able to follow exercise prescription today without complaint.  Will continue to monitor for progression.    Dr. Emily Filbert is Medical Director for [place] and [facility].

## 2021-01-07 ENCOUNTER — Other Ambulatory Visit: Payer: Self-pay

## 2021-01-07 DIAGNOSIS — Z951 Presence of aortocoronary bypass graft: Secondary | ICD-10-CM | POA: Diagnosis not present

## 2021-01-07 NOTE — Progress Notes (Signed)
Daily Session Note  Patient Details  Name: Andrew Richardson MRN: [id number] Date of Birth: October 10, 1940 Referring Provider:   Flowsheet Row Cardiac Rehab from 12/01/2020 in [facility] Cardiac and Pulmonary Rehab  Referring Provider Isaias Cowman MD      Encounter Date: 01/07/2021  Check In:  Session Check In - 01/07/21 0923      Check-In   Supervising physician immediately available to respond to emergencies See telemetry face sheet for immediately available ER MD    Location [facility]-Cardiac & Pulmonary Rehab    Staff Present Birdie Sons, MPA, Elveria Rising, BA, ACSM CEP, Exercise Physiologist;Joseph Tessie Fass RCP,RRT,BSRT    Virtual Visit No    Medication changes reported     No    Fall or balance concerns reported    No    Warm-up and Cool-down Performed on first and last piece of equipment    Resistance Training Performed Yes    VAD Patient? No    PAD/SET Patient? No      Pain Assessment   Currently in Pain? No/denies              Social History   Tobacco Use  Smoking Status Former Smoker  . Quit date: 05/19/1989  . Years since quitting: 31.6  Smokeless Tobacco Former Systems developer    Goals Met:  Independence with exercise equipment Exercise tolerated well No report of cardiac concerns or symptoms Strength training completed today  Goals Unmet:  Not Applicable  Comments: Pt able to follow exercise prescription today without complaint.  Will continue to monitor for progression.    Dr. Emily Filbert is Medical Director for [place] and [facility].

## 2021-01-09 ENCOUNTER — Encounter: Payer: Medicare Other | Admitting: *Deleted

## 2021-01-09 ENCOUNTER — Other Ambulatory Visit: Payer: Self-pay

## 2021-01-09 DIAGNOSIS — Z951 Presence of aortocoronary bypass graft: Secondary | ICD-10-CM

## 2021-01-09 NOTE — Progress Notes (Signed)
Daily Session Note  Patient Details  Name: Andrew Richardson MRN: [id number] Date of Birth: 24-Nov-1940 Referring Provider:   Flowsheet Row Cardiac Rehab from 12/01/2020 in [facility] Cardiac and Pulmonary Rehab  Referring Provider Isaias Cowman MD      Encounter Date: 01/09/2021  Check In:  Session Check In - 01/09/21 0917      Check-In   Supervising physician immediately available to respond to emergencies See telemetry face sheet for immediately available ER MD    Location [facility]-Cardiac & Pulmonary Rehab    Staff Present Renita Papa, RN BSN;Joseph Hood RCP,RRT,BSRT;Melissa [place] RDN, LDN    Virtual Visit No    Medication changes reported     No    Fall or balance concerns reported    No    Warm-up and Cool-down Performed on first and last piece of equipment    Resistance Training Performed Yes    VAD Patient? No    PAD/SET Patient? No      Pain Assessment   Currently in Pain? No/denies              Social History   Tobacco Use  Smoking Status Former Smoker  . Quit date: 05/19/1989  . Years since quitting: 31.6  Smokeless Tobacco Former Systems developer    Goals Met:  Independence with exercise equipment Exercise tolerated well No report of cardiac concerns or symptoms Strength training completed today  Goals Unmet:  Not Applicable  Comments: Pt able to follow exercise prescription today without complaint.  Will continue to monitor for progression.    Dr. Emily Filbert is Medical Director for [place] and [facility].

## 2021-01-12 ENCOUNTER — Encounter: Payer: Medicare Other | Admitting: *Deleted

## 2021-01-12 ENCOUNTER — Other Ambulatory Visit: Payer: Self-pay

## 2021-01-12 DIAGNOSIS — Z951 Presence of aortocoronary bypass graft: Secondary | ICD-10-CM

## 2021-01-12 NOTE — Progress Notes (Signed)
Daily Session Note  Patient Details  Name: Andrew Richardson MRN: [id number] Date of Birth: Jan 03, 1941 Referring Provider:   Flowsheet Row Cardiac Rehab from 12/01/2020 in [facility] Cardiac and Pulmonary Rehab  Referring Provider Isaias Cowman MD      Encounter Date: 01/12/2021  Check In:  Session Check In - 01/12/21 1002      Check-In   Supervising physician immediately available to respond to emergencies See telemetry face sheet for immediately available ER MD    Location [facility]-Cardiac & Pulmonary Rehab    Staff Present Heath Lark, RN, BSN, Laveda Norman, BS, ACSM CEP, Exercise Physiologist;Joseph Tessie Fass RCP,RRT,BSRT    Virtual Visit No    Medication changes reported     No    Fall or balance concerns reported    No    Warm-up and Cool-down Performed on first and last piece of equipment    Resistance Training Performed Yes    VAD Patient? No    PAD/SET Patient? No      Pain Assessment   Currently in Pain? No/denies              Social History   Tobacco Use  Smoking Status Former Smoker  . Quit date: 05/19/1989  . Years since quitting: 31.6  Smokeless Tobacco Former Systems developer    Goals Met:  Independence with exercise equipment Exercise tolerated well No report of cardiac concerns or symptoms  Goals Unmet:  Not Applicable  Comments: Pt able to follow exercise prescription today without complaint.  Will continue to monitor for progression.    Dr. Emily Filbert is Medical Director for [place] and [facility].

## 2021-01-14 ENCOUNTER — Encounter: Payer: Self-pay | Admitting: *Deleted

## 2021-01-14 ENCOUNTER — Encounter: Payer: Medicare Other | Admitting: *Deleted

## 2021-01-14 ENCOUNTER — Other Ambulatory Visit: Payer: Self-pay

## 2021-01-14 DIAGNOSIS — Z951 Presence of aortocoronary bypass graft: Secondary | ICD-10-CM

## 2021-01-14 NOTE — Progress Notes (Signed)
Daily Session Note  Patient Details  Name: Andrew Richardson MRN: [id number] Date of Birth: 13-Feb-1941 Referring Provider:   Flowsheet Row Cardiac Rehab from 12/01/2020 in [facility] Cardiac and Pulmonary Rehab  Referring Provider Isaias Cowman MD      Encounter Date: 01/14/2021  Check In:  Session Check In - 01/14/21 0931      Check-In   Supervising physician immediately available to respond to emergencies See telemetry face sheet for immediately available ER MD    Location [facility]-Cardiac & Pulmonary Rehab    Staff Present Justin Mend RCP,RRT,BSRT;Armella Stogner Sherryll Burger, RN BSN;Melissa Caiola RDN, LDN    Virtual Visit No    Medication changes reported     No    Fall or balance concerns reported    No    Warm-up and Cool-down Performed on first and last piece of equipment    Resistance Training Performed Yes    VAD Patient? No    PAD/SET Patient? No      Pain Assessment   Currently in Pain? No/denies              Social History   Tobacco Use  Smoking Status Former Smoker  . Quit date: 05/19/1989  . Years since quitting: 31.6  Smokeless Tobacco Former Systems developer    Goals Met:  Independence with exercise equipment Exercise tolerated well No report of cardiac concerns or symptoms Strength training completed today  Goals Unmet:  Not Applicable  Comments: Pt able to follow exercise prescription today without complaint.  Will continue to monitor for progression.    Dr. Emily Filbert is Medical Director for [place] and [facility].

## 2021-01-14 NOTE — Progress Notes (Signed)
Cardiac Individual Treatment Plan  Patient Details  Name: Andrew Richardson MRN: [id number] Date of Birth: [birth year] Referring Provider:   Flowsheet Row Cardiac Rehab from 12/01/2020 in [facility] Cardiac and Pulmonary Rehab  Referring Provider Isaias Cowman MD      Initial Encounter Date:  Flowsheet Row Cardiac Rehab from 12/01/2020 in [facility] Cardiac and Pulmonary Rehab  Date 12/01/20      Visit Diagnosis: S/P CABG x 4  Patient's Home Medications on Admission:  Current Outpatient Medications:  .  amiodarone (PACERONE) 200 MG tablet, Take 1 tablet (200 mg total) by mouth 2 (two) times daily. For 10 days;then take 200 mg daily thereafter, Disp: 60 tablet, Rfl: 1 .  amLODipine (NORVASC) 5 MG tablet, Take 5 mg by mouth every evening., Disp: , Rfl:  .  aspirin EC 325 MG EC tablet, Take 1 tablet (325 mg total) by mouth daily., Disp: 30 tablet, Rfl: 0 .  atorvastatin (LIPITOR) 10 MG tablet, Take 10 mg by mouth every evening., Disp: , Rfl:  .  levothyroxine (SYNTHROID) 50 MCG tablet, Take 50 mcg by mouth daily., Disp: , Rfl:  .  losartan (COZAAR) 100 MG tablet, Take 100 mg by mouth every evening., Disp: , Rfl:  .  metoprolol tartrate (LOPRESSOR) 25 MG tablet, Take 1 tablet (25 mg total) by mouth 2 (two) times daily., Disp: 60 tablet, Rfl: 1 .  polyvinyl alcohol (LIQUIFILM TEARS) 1.4 % ophthalmic solution, Place 1 drop into both eyes daily as needed for dry eyes., Disp: , Rfl:  .  tamsulosin (FLOMAX) 0.4 MG CAPS capsule, Take 0.4 mg by mouth daily., Disp: , Rfl:  .  traMADol (ULTRAM) 50 MG tablet, Take 1 tablet (50 mg total) by mouth every 6 (six) hours as needed for moderate pain., Disp: 50 tablet, Rfl: 0  Past Medical History: Past Medical History:  Diagnosis Date  . Colon polyps   . Coronary artery disease   . Depression   . Headache   . Hyperlipemia   . Hypertension   . Hypothyroid   . Prediabetes   . 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 Tubular adenoma of colon      Tobacco Use: Social History   Tobacco Use  Smoking Status Former Smoker  . Quit date: 05/19/1989  . 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 Latest Ref Rng & Units 10/08/2020 10/08/2020 10/08/2020 10/08/2020 10/09/2020   Cholestrol 0 - 200 mg/dL - - - - -   LDLCALC 0 - 99 mg/dL - - - - -   HDL >40 mg/dL - - - - -   Trlycerides <150 mg/dL - - - - -   Hemoglobin A1c 4.8 - 5.6 % - - - - -   PHART 7.350 - 7.450 - 7.387 7.282(L) 7.259(L) 7.327(L)   PCO2ART 32.0 - 48.0 mmHg - 38.9 46.1 49.5(H) 40.7   HCO3 20.0 - 28.0 mmol/L - 23.5 21.8 22.1 21.1   TCO2 22 - 32 mmol/L 28 25 23 24 22    ACIDBASEDEF 0.0 - 2.0 mmol/L - 1.0 5.0(H) 5.0(H) 4.0(H)   O2SAT % - 100.0 97.0 98.0 97.0       Exercise Target Goals: Exercise Program Goal: Individual exercise prescription set using results from initial 6 min walk test and THRR while considering  patient's activity barriers and safety.   Exercise Prescription Goal: Initial exercise prescription builds to  30-45 minutes a day of aerobic activity, 2-3 days per week.  Home exercise guidelines will be given to patient during program as part of exercise prescription that the participant will acknowledge.   Education: Aerobic Exercise: - Group verbal and visual presentation on the components of exercise prescription. Introduces F.I.T.T principle from ACSM for exercise prescriptions.  Reviews F.I.T.T. principles of aerobic exercise including progression. Written material given at graduation. Flowsheet Row Cardiac Rehab from 01/07/2021 in [facility] Cardiac and Pulmonary Rehab  Education need identified 12/01/20      Education: Resistance Exercise: - Group verbal and visual presentation on the components of exercise prescription. Introduces F.I.T.T principle from ACSM for exercise prescriptions  Reviews F.I.T.T. principles of resistance exercise including  progression. Written material given at graduation.    Education: Exercise & Equipment Safety: - Individual verbal instruction and demonstration of equipment use and safety with use of the equipment. Flowsheet Row Cardiac Rehab from 01/07/2021 in [facility] Cardiac and Pulmonary Rehab  Date 12/01/20  Educator [facility]  Instruction Review Code 1- Verbalizes Understanding      Education: Exercise Physiology & General Exercise Guidelines: - Group verbal and written instruction with models to review the exercise physiology of the cardiovascular system and associated critical values. Provides general exercise guidelines with specific guidelines to those with heart or lung disease.  Flowsheet Row Cardiac Rehab from 01/07/2021 in [facility] Cardiac and Pulmonary Rehab  Date 01/07/21  Educator [facility]  Instruction Review Code 1- Verbalizes Understanding      Education: Flexibility, Balance, Mind/Body Relaxation: - Group verbal and visual presentation with interactive activity on the components of exercise prescription. Introduces F.I.T.T principle from ACSM for exercise prescriptions. Reviews F.I.T.T. principles of flexibility and balance exercise training including progression. Also discusses the mind body connection.  Reviews various relaxation techniques to help reduce and manage stress (i.e. Deep breathing, progressive muscle relaxation, and visualization). Balance handout provided to take home. Written material given at graduation.   Activity Barriers & Risk Stratification:  Activity Barriers & Cardiac Risk Stratification - 12/01/20 1209      Activity Barriers & Cardiac Risk Stratification   Activity Barriers Incisional Pain;Deconditioning;Muscular Weakness;Balance Concerns    Cardiac Risk Stratification High           6 Minute Walk:  6 Minute Walk    Row Name 12/01/20 1207         6 Minute Walk   Phase Initial     Distance 785 feet     Walk Time 6 minutes     # of Rest Breaks 0     MPH 1.49      METS 1.17     RPE 13     Perceived Dyspnea  1     VO2 Peak 4.09     Symptoms Yes (comment)     Comments some leg weakness 1/10, using handrail     Resting HR 52 bpm     Resting BP 132/62     Resting Oxygen Saturation  97 %     Exercise Oxygen Saturation  during 6 min walk 96 %     Max Ex. HR 83 bpm     Max Ex. BP 136/76     2 Minute Post BP 132/74            Oxygen Initial Assessment:   Oxygen Re-Evaluation:   Oxygen Discharge (Final Oxygen Re-Evaluation):   Initial Exercise Prescription:  Initial Exercise Prescription - 12/01/20 1200      Date of  Initial Exercise RX and Referring Provider   Date 12/01/20    Referring Provider Paraschos, Alexander MD      Treadmill   MPH 1.4    Grade 0    Minutes 15    METs 2.07      NuStep   Level 1    SPM 80    Minutes 15    METs 2      T5 Nustep   Level 1    SPM 80    Minutes 15    METs 2      Biostep-RELP   Level 1    SPM 50    Minutes 15    METs 2      Prescription Details   Frequency (times per week) 3    Duration Progress to 30 minutes of continuous aerobic without signs/symptoms of physical distress      Intensity   THRR 40-80% of Max Heartrate 87-122    Ratings of Perceived Exertion 11-13    Perceived Dyspnea 0-4      Progression   Progression Continue to progress workloads to maintain intensity without signs/symptoms of physical distress.      Resistance Training   Training Prescription Yes    Weight 4 lb    Reps 10-15           Perform Capillary Blood Glucose checks as needed.  Exercise Prescription Changes:  Exercise Prescription Changes    Row Name 12/01/20 1200 12/16/20 1500 12/31/20 1300         Response to Exercise   Blood Pressure (Admit) 132/62 110/60 152/72     Blood Pressure (Exercise) 136/76 130/70 118/60     Blood Pressure (Exit) 132/74 102/52 124/66     Heart Rate (Admit) 52 bpm 54 bpm 59 bpm     Heart Rate (Exercise) 83 bpm 86 bpm 86 bpm     Heart Rate (Exit) 54 bpm  57 bpm 61 bpm     Oxygen Saturation (Admit) 97 % -- --     Oxygen Saturation (Exercise) 96 % -- --     Rating of Perceived Exertion (Exercise) 13 12 12      Perceived Dyspnea (Exercise) 1 -- --     Symptoms leg weakness 1/10 none none     Comments walk test results -- --     Duration -- Continue with 30 min of aerobic exercise without signs/symptoms of physical distress. Continue with 30 min of aerobic exercise without signs/symptoms of physical distress.     Intensity -- THRR unchanged THRR unchanged           Progression   Progression -- Continue to progress workloads to maintain intensity without signs/symptoms of physical distress. Continue to progress workloads to maintain intensity without signs/symptoms of physical distress.     Average METs -- 2.37 2           Resistance Training   Training Prescription -- Yes Yes     Weight -- 4 lb 4 lb     Reps -- 10-15 10-15           Interval Training   Interval Training -- No No           Treadmill   MPH -- 1.4 1.4     Grade -- 0 --     Minutes -- 15 15     METs -- 2.07 2.07           NuStep   Level --  3 --     Minutes -- 15 --     METs -- 2.4 --           T5 Nustep   Level -- 3 5     Minutes -- 15 15     METs -- 2 2           Biostep-RELP   Level -- 3 --     Minutes -- 15 --     METs -- 3 --            Exercise Comments:  Exercise Comments    Row Name 12/03/20 7253           Exercise Comments First full day of exercise!  Patient was oriented to gym and equipment including functions, settings, policies, and procedures.  Patient's individual exercise prescription and treatment plan were reviewed.  All starting workloads were established based on the results of the 6 minute walk test done at initial orientation visit.  The plan for exercise progression was also introduced and progression will be customized based on patient's performance and goals.              Exercise Goals and Review:  Exercise Goals     Row Name 12/01/20 1213             Exercise Goals   Increase Physical Activity Yes       Intervention Provide advice, education, support and counseling about physical activity/exercise needs.;Develop an individualized exercise prescription for aerobic and resistive training based on initial evaluation findings, risk stratification, comorbidities and participant's personal goals.       Expected Outcomes Short Term: Attend rehab on a regular basis to increase amount of physical activity.;Long Term: Add in home exercise to make exercise part of routine and to increase amount of physical activity.;Long Term: Exercising regularly at least 3-5 days a week.       Increase Strength and Stamina Yes       Intervention Provide advice, education, support and counseling about physical activity/exercise needs.;Develop an individualized exercise prescription for aerobic and resistive training based on initial evaluation findings, risk stratification, comorbidities and participant's personal goals.       Expected Outcomes Short Term: Increase workloads from initial exercise prescription for resistance, speed, and METs.;Short Term: Perform resistance training exercises routinely during rehab and add in resistance training at home;Long Term: Improve cardiorespiratory fitness, muscular endurance and strength as measured by increased METs and functional capacity (6MWT)       Able to understand and use rate of perceived exertion (RPE) scale Yes       Intervention Provide education and explanation on how to use RPE scale       Expected Outcomes Short Term: Able to use RPE daily in rehab to express subjective intensity level;Long Term:  Able to use RPE to guide intensity level when exercising independently       Able to understand and use Dyspnea scale Yes       Intervention Provide education and explanation on how to use Dyspnea scale       Expected Outcomes Long Term: Able to use Dyspnea scale to guide intensity level  when exercising independently;Short Term: Able to use Dyspnea scale daily in rehab to express subjective sense of shortness of breath during exertion       Knowledge and understanding of Target Heart Rate Range (THRR) Yes       Intervention Provide education and explanation of THRR including  how the numbers were predicted and where they are located for reference       Expected Outcomes Short Term: Able to state/look up THRR;Short Term: Able to use daily as guideline for intensity in rehab;Long Term: Able to use THRR to govern intensity when exercising independently       Able to check pulse independently Yes       Intervention Provide education and demonstration on how to check pulse in carotid and radial arteries.;Review the importance of being able to check your own pulse for safety during independent exercise       Expected Outcomes Short Term: Able to explain why pulse checking is important during independent exercise;Long Term: Able to check pulse independently and accurately       Understanding of Exercise Prescription Yes       Intervention Provide education, explanation, and written materials on patient's individual exercise prescription       Expected Outcomes Short Term: Able to explain program exercise prescription;Long Term: Able to explain home exercise prescription to exercise independently              Exercise Goals Re-Evaluation :  Exercise Goals Re-Evaluation    Row Name 12/03/20 0925 12/16/20 1527 12/31/20 1357         Exercise Goal Re-Evaluation   Exercise Goals Review Increase Physical Activity;Able to understand and use rate of perceived exertion (RPE) scale;Knowledge and understanding of Target Heart Rate Range (THRR);Understanding of Exercise Prescription;Increase Strength and Stamina;Able to understand and use Dyspnea scale;Able to check pulse independently Increase Physical Activity;Increase Strength and Stamina;Understanding of Exercise Prescription Increase Physical  Activity;Increase Strength and Stamina     Comments Reviewed RPE and dyspnea scales, THR and program prescription with pt today.  Pt voiced understanding and was given a copy of goals to take home. Timmothy Sours has been doing well in rehab.  He is up to level 3 on the seated equipment.  We will continue to monitor his porgress. Timmothy Sours has increased to level 5 on T5.  He wants to build leg strength.Walking is still challenging for him.  Staff will monitor progress.     Expected Outcomes Short: Use RPE daily to regulate intensity. Long: Follow program prescription in THR. Short: Try to add incline to treadmill Long: Continue to improve stamina Short:  work on TM/walking Long:  Conitnue to build stamina            Discharge Exercise Prescription (Final Exercise Prescription Changes):  Exercise Prescription Changes - 12/31/20 1300      Response to Exercise   Blood Pressure (Admit) 152/72    Blood Pressure (Exercise) 118/60    Blood Pressure (Exit) 124/66    Heart Rate (Admit) 59 bpm    Heart Rate (Exercise) 86 bpm    Heart Rate (Exit) 61 bpm    Rating of Perceived Exertion (Exercise) 12    Symptoms none    Duration Continue with 30 min of aerobic exercise without signs/symptoms of physical distress.    Intensity THRR unchanged      Progression   Progression Continue to progress workloads to maintain intensity without signs/symptoms of physical distress.    Average METs 2      Resistance Training   Training Prescription Yes    Weight 4 lb    Reps 10-15      Interval Training   Interval Training No      Treadmill   MPH 1.4    Minutes 15    METs  2.07      T5 Nustep   Level 5    Minutes 15    METs 2           Nutrition:  Target Goals: Understanding of nutrition guidelines, daily intake of sodium 1500mg , cholesterol 200mg , calories 30% from fat and 7% or less from saturated fats, daily to have 5 or more servings of fruits and vegetables.  Education: All About Nutrition: -Group  instruction provided by verbal, written material, interactive activities, discussions, models, and posters to present general guidelines for heart healthy nutrition including fat, fiber, MyPlate, the role of sodium in heart healthy nutrition, utilization of the nutrition label, and utilization of this knowledge for meal planning. Follow up email sent as well. Written material given at graduation.   Biometrics:  Pre Biometrics - 12/01/20 1213      Pre Biometrics   Height 5' 7.25" (1.708 m)    Weight 187 lb 14.4 oz (85.2 kg)    BMI (Calculated) 29.22    Single Leg Stand 1.2 seconds            Nutrition Therapy Plan and Nutrition Goals:  Nutrition Therapy & Goals - 12/01/20 1214      Intervention Plan   Intervention Prescribe, educate and counsel regarding individualized specific dietary modifications aiming towards targeted core components such as weight, hypertension, lipid management, diabetes, heart failure and other comorbidities.;Nutrition handout(s) given to patient.    Expected Outcomes Short Term Goal: Understand basic principles of dietary content, such as calories, fat, sodium, cholesterol and nutrients.;Short Term Goal: A plan has been developed with personal nutrition goals set during dietitian appointment.;Long Term Goal: Adherence to prescribed nutrition plan.           Nutrition Assessments:  MEDIFICTS Score Key:  ?70 Need to make dietary changes   40-70 Heart Healthy Diet  ? 40 Therapeutic Level Cholesterol Diet  Flowsheet Row Cardiac Rehab from 12/01/2020 in [facility] Cardiac and Pulmonary Rehab  Picture Your Plate Total Score on Admission 75     Picture Your Plate Scores:  <17 Unhealthy dietary pattern with much room for improvement.  41-50 Dietary pattern unlikely to meet recommendations for good health and room for improvement.  51-60 More healthful dietary pattern, with some room for improvement.   >60 Healthy dietary pattern, although there may be some  specific behaviors that could be improved.    Nutrition Goals Re-Evaluation:  Nutrition Goals Re-Evaluation    [place] Name 01/02/21 0935             Goals   Current Weight 192 lb (87.1 kg)       Nutrition Goal Lose weight. Cut back on meat intake.       Comment Timmothy Sours wants to lose some weight but feels like he eats to much meat. His portions sizes may be larger than what he needs. He is willing to try to cut back on his portion sizes. Informed Timmothy Sours that using a smaller plate may help with his portion control.       Expected Outcome Short: cut back on portion sizes. Long: lose weight.              Nutrition Goals Discharge (Final Nutrition Goals Re-Evaluation):  Nutrition Goals Re-Evaluation - 01/02/21 0935      Goals   Current Weight 192 lb (87.1 kg)    Nutrition Goal Lose weight. Cut back on meat intake.    Comment Timmothy Sours wants to lose some weight but feels like  he eats to much meat. His portions sizes may be larger than what he needs. He is willing to try to cut back on his portion sizes. Informed Timmothy Sours that using a smaller plate may help with his portion control.    Expected Outcome Short: cut back on portion sizes. Long: lose weight.           Psychosocial: Target Goals: Acknowledge presence or absence of significant depression and/or stress, maximize coping skills, provide positive support system. Participant is able to verbalize types and ability to use techniques and skills needed for reducing stress and depression.   Education: Stress, Anxiety, and Depression - Group verbal and visual presentation to define topics covered.  Reviews how body is impacted by stress, anxiety, and depression.  Also discusses healthy ways to reduce stress and to treat/manage anxiety and depression.  Written material given at graduation. Flowsheet Row Cardiac Rehab from 01/07/2021 in [facility] Cardiac and Pulmonary Rehab  Date 12/31/20  Educator [facility]  Instruction Review Code 1- [employer] Understanding       Education: Sleep Hygiene -Provides group verbal and written instruction about how sleep can affect your health.  Define sleep hygiene, discuss sleep cycles and impact of sleep habits. Review good sleep hygiene tips.    Initial Review & Psychosocial Screening:  Initial Psych Review & Screening - 11/19/20 1405      Initial Review   Current issues with Current Abuse or Neglect to Report;Current Anxiety/Panic;Current Stress Concerns    Source of Stress Concerns Unable to participate in former interests or hobbies;Unable to perform yard/household activities      [place]? Yes   wife and good friends     Barriers   Psychosocial barriers to participate in program There are no identifiable barriers or psychosocial needs.;The patient should benefit from training in stress management and relaxation.      Screening Interventions   Interventions Encouraged to exercise;Provide feedback about the scores to participant;To provide support and resources with identified psychosocial needs    Expected Outcomes Short Term goal: Utilizing psychosocial counselor, staff and physician to assist with identification of specific Stressors or current issues interfering with healing process. Setting desired goal for each stressor or current issue identified.;Long Term Goal: Stressors or current issues are controlled or eliminated.;Short Term goal: Identification and review with participant of any Quality of Life or Depression concerns found by scoring the questionnaire.;Long Term goal: The participant improves quality of Life and PHQ9 Scores as seen by post scores and/or verbalization of changes           Quality of Life Scores:   Quality of Life - 12/01/20 1214      Quality of Life   Select Quality of Life      Quality of Life Scores   Health/Function Pre 17.27 %    Socioeconomic Pre 25 %    Psych/Spiritual Pre 14.71 %    Family Pre 23.13 %    GLOBAL Pre 19.08 %           Scores of 19 and below usually indicate a poorer quality of life in these areas.  A difference of  2-3 points is a clinically meaningful difference.  A difference of 2-3 points in the total score of the Quality of Life Index has been associated with significant improvement in overall quality of life, self-image, physical symptoms, and general health in studies assessing change in quality of life.  PHQ-9: Recent Review Flowsheet Data  Depression screen [facility] 2/9 12/31/2020 12/01/2020   Decreased Interest 1 1   Down, Depressed, Hopeless 1 1   PHQ - 2 Score 2 2   Altered sleeping 0 0   Tired, decreased energy 2 1   Change in appetite 0 1   Feeling bad or failure about yourself  1 2   Trouble concentrating 0 2   Moving slowly or fidgety/restless 0 1   Suicidal thoughts 0 1   PHQ-9 Score 5 10   Difficult doing work/chores Not difficult at all Somewhat difficult     Interpretation of Total Score  Total Score Depression Severity:  1-4 = Minimal depression, 5-9 = Mild depression, 10-14 = Moderate depression, 15-19 = Moderately severe depression, 20-27 = Severe depression   Psychosocial Evaluation and Intervention:  Psychosocial Evaluation - 11/19/20 1415      Psychosocial Evaluation & Interventions   Interventions Encouraged to exercise with the program and follow exercise prescription;Relaxation education    Comments Mr. Hocevar reports healing well after his CABG x 4. His wife and friends have helped take care of him and let him heal well. He was quite active before his surgery and hopes to gain back some of his stamina. He wants to maintain independent as long as possible. He did share that his chart says he has a history of depression but he doesn't remember ever being diagnosed with it. He does state that his anxiety has increased since surgery but is getting better. Sometimes he wakes up anxious and worrying about life but he focuses on relaxing and taking deep breaths. He wants to attend  cardiac rehab to boost his stamina and gain knowledge to help maintain a heart healthy lifestyle.    Expected Outcomes Short: attend cardiac rehab for education and exercise. Long: develop and maintain positive self care habits.    Continue Psychosocial Services  Follow up required by staff           Psychosocial Re-Evaluation:  Psychosocial Re-Evaluation    [place] Name 12/31/20 0935             Psychosocial Re-Evaluation   Comments Reviewed patient health questionnaire (PHQ-9) with patient for follow up. Previously, patients score indicated signs/symptoms of depression.  Reviewed to see if patient is improving symptom wise while in program.  Score improved and patient states that it is because he feels less anxious after his surgery and heart event.       Expected Outcomes Short: Continue to attend [facility] regularly for regular exercise and social engagement. Long: Continue to improve symptoms and manage a positive mental state.       Interventions Encouraged to attend Cardiac Rehabilitation for the exercise       Continue Psychosocial Services  Follow up required by staff              Psychosocial Discharge (Final Psychosocial Re-Evaluation):  Psychosocial Re-Evaluation - 12/31/20 0935      Psychosocial Re-Evaluation   Comments Reviewed patient health questionnaire (PHQ-9) with patient for follow up. Previously, patients score indicated signs/symptoms of depression.  Reviewed to see if patient is improving symptom wise while in program.  Score improved and patient states that it is because he feels less anxious after his surgery and heart event.    Expected Outcomes Short: Continue to attend [facility] regularly for regular exercise and social engagement. Long: Continue to improve symptoms and manage a positive mental state.    Interventions Encouraged to attend Cardiac Rehabilitation for the  exercise    Continue Psychosocial Services  Follow up required by staff            Vocational Rehabilitation: Provide vocational rehab assistance to qualifying candidates.   Vocational Rehab Evaluation & Intervention:  Vocational Rehab - 11/19/20 1415      Initial Vocational Rehab Evaluation & Intervention   Assessment shows need for Vocational Rehabilitation No           Education: Education Goals: Education classes will be provided on a variety of topics geared toward better understanding of heart health and risk factor modification. Participant will state understanding/return demonstration of topics presented as noted by education test scores.  Learning Barriers/Preferences:  Learning Barriers/Preferences - 11/19/20 1415      Learning Barriers/Preferences   Learning Barriers Hearing    Learning Preferences None           General Cardiac Education Topics:  AED/CPR: - Group verbal and written instruction with the use of models to demonstrate the basic use of the AED with the basic ABC's of resuscitation.   Anatomy and Cardiac Procedures: - Group verbal and visual presentation and models provide information about basic cardiac anatomy and function. Reviews the testing methods done to diagnose heart disease and the outcomes of the test results. Describes the treatment choices: Medical Management, Angioplasty, or Coronary Bypass Surgery for treating various heart conditions including Myocardial Infarction, Angina, Valve Disease, and Cardiac Arrhythmias.  Written material given at graduation.   Medication Safety: - Group verbal and visual instruction to review commonly prescribed medications for heart and lung disease. Reviews the medication, class of the drug, and side effects. Includes the steps to properly store meds and maintain the prescription regimen.  Written material given at graduation. Flowsheet Row Cardiac Rehab from 01/07/2021 in [facility] Cardiac and Pulmonary Rehab  Date 12/10/20  Educator SB  Instruction Review Code 1- Verbalizes Understanding       Intimacy: - Group verbal instruction through game format to discuss how heart and lung disease can affect sexual intimacy. Written material given at graduation..   Know Your Numbers and Heart Failure: - Group verbal and visual instruction to discuss disease risk factors for cardiac and pulmonary disease and treatment options.  Reviews associated critical values for Overweight/Obesity, Hypertension, Cholesterol, and Diabetes.  Discusses basics of heart failure: signs/symptoms and treatments.  Introduces Heart Failure Zone chart for action plan for heart failure.  Written material given at graduation. Flowsheet Row Cardiac Rehab from 01/07/2021 in [facility] Cardiac and Pulmonary Rehab  Date 12/17/20  Educator SB  Instruction Review Code 1- Verbalizes Understanding      Infection Prevention: - Provides verbal and written material to individual with discussion of infection control including proper hand washing and proper equipment cleaning during exercise session. Flowsheet Row Cardiac Rehab from 01/07/2021 in [facility] Cardiac and Pulmonary Rehab  Date 12/01/20  Educator [facility]  Instruction Review Code 1- Verbalizes Understanding      Falls Prevention: - Provides verbal and written material to individual with discussion of falls prevention and safety. Flowsheet Row Cardiac Rehab from 01/07/2021 in [facility] Cardiac and Pulmonary Rehab  Date 12/01/20  Educator [facility]  Instruction Review Code 1- Verbalizes Understanding      Other: -Provides group and verbal instruction on various topics (see comments)   Knowledge Questionnaire Score:  Knowledge Questionnaire Score - 12/01/20 1217      Knowledge Questionnaire Score   Pre Score 24/26 Education Focus: Pulse, exercise  Core Components/Risk Factors/Patient Goals at Admission:  Personal Goals and Risk Factors at Admission - 12/01/20 1217      Core Components/Risk Factors/Patient Goals on Admission    Weight Management Yes;Weight Loss     Intervention Weight Management: Develop a combined nutrition and exercise program designed to reach desired caloric intake, while maintaining appropriate intake of nutrient and fiber, sodium and fats, and appropriate energy expenditure required for the weight goal.;Weight Management: Provide education and appropriate resources to help participant work on and attain dietary goals.;Weight Management/Obesity: Establish reasonable short term and long term weight goals.    Admit Weight 187 lb 14.4 oz (85.2 kg)    Goal Weight: Short Term 182 lb (82.6 kg)    Goal Weight: Long Term 175 lb (79.4 kg)    Expected Outcomes Short Term: Continue to assess and modify interventions until short term weight is achieved;Long Term: Adherence to nutrition and physical activity/exercise program aimed toward attainment of established weight goal;Understanding recommendations for meals to include 15-35% energy as protein, 25-35% energy from fat, 35-60% energy from carbohydrates, less than 200mg  of dietary cholesterol, 20-35 gm of total fiber daily;Understanding of distribution of calorie intake throughout the day with the consumption of 4-5 meals/snacks;Weight Loss: Understanding of general recommendations for a balanced deficit meal plan, which promotes 1-2 lb weight loss per week and includes a negative energy balance of [phone] kcal/d    Hypertension Yes    Intervention Provide education on lifestyle modifcations including regular physical activity/exercise, weight management, moderate sodium restriction and increased consumption of fresh fruit, vegetables, and low fat dairy, alcohol moderation, and smoking cessation.;Monitor prescription use compliance.    Expected Outcomes Short Term: Continued assessment and intervention until BP is < 140/28mm HG in hypertensive participants. < 130/36mm HG in hypertensive participants with diabetes, heart failure or chronic kidney disease.;Long Term: Maintenance of blood pressure at goal  levels.    Lipids Yes    Intervention Provide education and support for participant on nutrition & aerobic/resistive exercise along with prescribed medications to achieve LDL 70mg , HDL >40mg .    Expected Outcomes Short Term: Participant states understanding of desired cholesterol values and is compliant with medications prescribed. Participant is following exercise prescription and nutrition guidelines.;Long Term: Cholesterol controlled with medications as prescribed, with individualized exercise RX and with personalized nutrition plan. Value goals: LDL < 70mg , HDL > 40 mg.           Education:Diabetes - Individual verbal and written instruction to review signs/symptoms of diabetes, desired ranges of glucose level fasting, after meals and with exercise. Acknowledge that pre and post exercise glucose checks will be done for 3 sessions at entry of program.   Core Components/Risk Factors/Patient Goals Review:   Goals and Risk Factor Review    Row Name 01/02/21 0933             Core Components/Risk Factors/Patient Goals Review   Personal Goals Review Weight Management/Obesity;Hypertension       Review Timmothy Sours states that he is eating too much lately. He has a good appetite and wants to lose weight. Dons blood pressure has been stable in the upper 120s over 60s. Timmothy Sours would like to lose 10 pounds.       Expected Outcomes Short: lose 5 pounds in the couple weeks. Long: Lose 10 pounds.              Core Components/Risk Factors/Patient Goals at Discharge (Final Review):   Goals and Risk Factor Review - 01/02/21 6387  Core Components/Risk Factors/Patient Goals Review   Personal Goals Review Weight Management/Obesity;Hypertension    Review Timmothy Sours states that he is eating too much lately. He has a good appetite and wants to lose weight. Dons blood pressure has been stable in the upper 120s over 60s. Timmothy Sours would like to lose 10 pounds.    Expected Outcomes Short: lose 5 pounds in the couple weeks.  Long: Lose 10 pounds.           ITP Comments:  ITP Comments    Row Name 11/19/20 1419 12/01/20 1207 12/03/20 0925 12/17/20 0717 01/14/21 0929   ITP Comments Initial telephone orientation completed. Diagnosis can be found in [facility] 1/10. EP orientation scheduled for Monday 3/7 at 10am. Completed 6MWT and gym orientation. Initial ITP created and sent for review to Dr. Emily Filbert, Medical Director. First full day of exercise!  Patient was oriented to gym and equipment including functions, settings, policies, and procedures.  Patient's individual exercise prescription and treatment plan were reviewed.  All starting workloads were established based on the results of the 6 minute walk test done at initial orientation visit.  The plan for exercise progression was also introduced and progression will be customized based on patient's performance and goals. 30 Day review completed. Medical Director ITP review done, changes made as directed, and signed approval by Medical Director. 30 Day review completed. Medical Director ITP review done, changes made as directed, and signed approval by Medical Director.          Comments:

## 2021-01-16 ENCOUNTER — Encounter: Payer: Medicare Other | Admitting: *Deleted

## 2021-01-16 ENCOUNTER — Other Ambulatory Visit: Payer: Self-pay

## 2021-01-16 DIAGNOSIS — Z951 Presence of aortocoronary bypass graft: Secondary | ICD-10-CM | POA: Diagnosis not present

## 2021-01-16 NOTE — Progress Notes (Signed)
Daily Session Note  Patient Details  Name: Andrew Richardson MRN: [id number] Date of Birth: 12/08/1940 Referring Provider:   Flowsheet Row Cardiac Rehab from 12/01/2020 in [facility] Cardiac and Pulmonary Rehab  Referring Provider Paraschos, Alexander MD      Encounter Date: 01/16/2021  Check In:  Session Check In - 01/16/21 1002      Check-In   Supervising physician immediately available to respond to emergencies See telemetry face sheet for immediately available ER MD    Location [facility]-Cardiac & Pulmonary Rehab    Staff Present Susanne Bice, RN, BSN, CCRP;Joseph Hood RCP,RRT,BSRT;Kara Langdon, MS Exercise Physiologist    Virtual Visit No    Medication changes reported     No    Fall or balance concerns reported    No    Warm-up and Cool-down Performed on first and last piece of equipment    Resistance Training Performed Yes    VAD Patient? No    PAD/SET Patient? No      Pain Assessment   Currently in Pain? No/denies              Social History   Tobacco Use  Smoking Status Former Smoker  . Quit date: 05/19/1989  . Years since quitting: 31.6  Smokeless Tobacco Former User    Goals Met:  Independence with exercise equipment Exercise tolerated well No report of cardiac concerns or symptoms  Goals Unmet:  Not Applicable  Comments: Pt able to follow exercise prescription today without complaint.  Will continue to monitor for progression.    Dr. Mark Miller is Medical Director for [facility] and [facility]. 

## 2021-01-19 ENCOUNTER — Other Ambulatory Visit: Payer: Self-pay

## 2021-01-19 ENCOUNTER — Encounter: Payer: Medicare Other | Admitting: *Deleted

## 2021-01-19 DIAGNOSIS — Z951 Presence of aortocoronary bypass graft: Secondary | ICD-10-CM | POA: Diagnosis not present

## 2021-01-19 NOTE — Progress Notes (Signed)
Daily Session Note  Patient Details  Name: DISHAWN BHARGAVA MRN: [id number] Date of Birth: 1941/07/25 Referring Provider:   Flowsheet Row Cardiac Rehab from 12/01/2020 in [facility] Cardiac and Pulmonary Rehab  Referring Provider Isaias Cowman MD      Encounter Date: 01/19/2021  Check In:  Session Check In - 01/19/21 1017      Check-In   Supervising physician immediately available to respond to emergencies See telemetry face sheet for immediately available ER MD    Location [facility]-Cardiac & Pulmonary Rehab    Staff Present Heath Lark, RN, BSN, CCRP;Joseph Hood RCP,RRT,BSRT;Kelly [place], [place], ACSM CEP, Exercise Physiologist    Virtual Visit No    Medication changes reported     No    Fall or balance concerns reported    No    Warm-up and Cool-down Performed on first and last piece of equipment    Resistance Training Performed Yes    VAD Patient? No    PAD/SET Patient? No      Pain Assessment   Currently in Pain? No/denies              Social History   Tobacco Use  Smoking Status Former Smoker  . Quit date: 05/19/1989  . Years since quitting: 31.6  Smokeless Tobacco Former Systems developer    Goals Met:  Independence with exercise equipment Exercise tolerated well No report of cardiac concerns or symptoms  Goals Unmet:  Not Applicable  Comments: Pt able to follow exercise prescription today without complaint.  Will continue to monitor for progression.    Dr. Emily Filbert is Medical Director for [place] and [facility].

## 2021-01-21 ENCOUNTER — Other Ambulatory Visit: Payer: Self-pay

## 2021-01-21 DIAGNOSIS — Z951 Presence of aortocoronary bypass graft: Secondary | ICD-10-CM

## 2021-01-21 NOTE — Progress Notes (Signed)
Daily Session Note  Patient Details  Name: LUZ BURCHER MRN: [id number] Date of Birth: 09/05/1941 Referring Provider:   Flowsheet Row Cardiac Rehab from 12/01/2020 in [facility] Cardiac and Pulmonary Rehab  Referring Provider Isaias Cowman MD      Encounter Date: 01/21/2021  Check In:  Session Check In - 01/21/21 0914      Check-In   Supervising physician immediately available to respond to emergencies See telemetry face sheet for immediately available ER MD    Location [facility]-Cardiac & Pulmonary Rehab    Staff Present Birdie Sons, MPA, Elveria Rising, BA, ACSM CEP, Exercise Physiologist;Joseph Tessie Fass RCP,RRT,BSRT    Virtual Visit No    Medication changes reported     No    Fall or balance concerns reported    No    Warm-up and Cool-down Performed on first and last piece of equipment    Resistance Training Performed Yes    VAD Patient? No    PAD/SET Patient? No      Pain Assessment   Currently in Pain? No/denies              Social History   Tobacco Use  Smoking Status Former Smoker  . Quit date: 05/19/1989  . Years since quitting: 31.6  Smokeless Tobacco Former Systems developer    Goals Met:  Independence with exercise equipment Exercise tolerated well No report of cardiac concerns or symptoms Strength training completed today  Goals Unmet:  Not Applicable  Comments: Pt able to follow exercise prescription today without complaint.  Will continue to monitor for progression.    Dr. Emily Filbert is Medical Director for [place] and [facility].

## 2021-01-23 ENCOUNTER — Other Ambulatory Visit: Payer: Self-pay

## 2021-01-23 DIAGNOSIS — Z951 Presence of aortocoronary bypass graft: Secondary | ICD-10-CM

## 2021-01-23 NOTE — Progress Notes (Signed)
Daily Session Note  Patient Details  Name: Andrew Richardson MRN: [id number] Date of Birth: [birth year] Referring Provider:   Flowsheet Row Cardiac Rehab from 12/01/2020 in [facility] Cardiac and Pulmonary Rehab  Referring Provider Isaias Cowman MD      Encounter Date: 01/23/2021  Check In:  Session Check In - 01/23/21 0949      Check-In   Supervising physician immediately available to respond to emergencies See telemetry face sheet for immediately available ER MD    Location [facility]-Cardiac & Pulmonary Rehab    Staff Present Basilia Jumbo, RN, BSN;Jessica [place], MA, RCEP, CCRP, CCET;Joseph Alcus Dad, RN BSN    Virtual Visit No    Medication changes reported     No    Fall or balance concerns reported    No    Tobacco Cessation No Change    Warm-up and Cool-down Performed on first and last piece of equipment    Resistance Training Performed Yes    VAD Patient? No    PAD/SET Patient? No      Pain Assessment   Currently in Pain? No/denies              Social History   Tobacco Use  Smoking Status Former Smoker  . Quit date: 05/19/1989  . Years since quitting: 31.7  Smokeless Tobacco Former Systems developer    Goals Met:  Independence with exercise equipment Exercise tolerated well No report of cardiac concerns or symptoms  Goals Unmet:  Not Applicable  Comments: Pt able to follow exercise prescription today without complaint.  Will continue to monitor for progression.    Dr. Emily Filbert is Medical Director for [place] and [facility].

## 2021-01-26 ENCOUNTER — Other Ambulatory Visit: Payer: Self-pay

## 2021-01-26 ENCOUNTER — Encounter: Payer: Medicare Other | Attending: Cardiology

## 2021-01-26 DIAGNOSIS — Z951 Presence of aortocoronary bypass graft: Secondary | ICD-10-CM | POA: Insufficient documentation

## 2021-01-26 NOTE — Progress Notes (Signed)
Daily Session Note  Patient Details  Name: Andrew Richardson MRN: [id number] Date of Birth: 02/22/1941 Referring Provider:   Flowsheet Row Cardiac Rehab from 12/01/2020 in [facility] Cardiac and Pulmonary Rehab  Referring Provider Isaias Cowman MD      Encounter Date: 01/26/2021  Check In:  Session Check In - 01/26/21 0949      Check-In   Supervising physician immediately available to respond to emergencies See telemetry face sheet for immediately available ER MD    Location [facility]-Cardiac & Pulmonary Rehab    Staff Present Alberteen Sam, MA, RCEP, CCRP, CCET;Amanda Sommer, BA, ACSM CEP, Exercise Physiologist;Amadu Schlageter RN, Moises Blood, BS, ACSM CEP, Exercise Physiologist;Joseph  [place]    Virtual Visit No    Medication changes reported     No    Fall or balance concerns reported    No    Tobacco Cessation No Change    Warm-up and Cool-down Performed on first and last piece of equipment    Resistance Training Performed Yes    VAD Patient? No    PAD/SET Patient? No      Pain Assessment   Currently in Pain? No/denies              Social History   Tobacco Use  Smoking Status Former Smoker  . Quit date: 05/19/1989  . Years since quitting: 31.7  Smokeless Tobacco Former Systems developer    Goals Met:  Proper associated with RPD/PD & O2 Sat Independence with exercise equipment Exercise tolerated well No report of cardiac concerns or symptoms Strength training completed today  Goals Unmet:  Not Applicable  Comments: Pt able to follow exercise prescription today without complaint.  Will continue to monitor for progression.   Dr. Emily Filbert is Medical Director for [place] and [facility].

## 2021-01-28 ENCOUNTER — Other Ambulatory Visit: Payer: Self-pay

## 2021-01-28 DIAGNOSIS — Z951 Presence of aortocoronary bypass graft: Secondary | ICD-10-CM | POA: Diagnosis not present

## 2021-01-28 NOTE — Progress Notes (Signed)
Daily Session Note  Patient Details  Name: Andrew Richardson MRN: [id number] Date of Birth: [birth year] Referring Provider:   Flowsheet Row Cardiac Rehab from 12/01/2020 in [facility] Cardiac and Pulmonary Rehab  Referring Provider Isaias Cowman MD      Encounter Date: 01/28/2021  Check In:  Session Check In - 01/28/21 0918      Check-In   Supervising physician immediately available to respond to emergencies See telemetry face sheet for immediately available ER MD    Location [facility]-Cardiac & Pulmonary Rehab    Staff Present Birdie Sons, MPA, RN;Joseph Darrin Ni[place], [place], RCEP, CCRP, CCET    Virtual Visit No    Medication changes reported     No    Fall or balance concerns reported    No    Tobacco Cessation No Change    Warm-up and Cool-down Performed on first and last piece of equipment    Resistance Training Performed Yes    VAD Patient? No    PAD/SET Patient? No      Pain Assessment   Currently in Pain? No/denies              Social History   Tobacco Use  Smoking Status Former Smoker  . Quit date: 05/19/1989  . Years since quitting: 31.7  Smokeless Tobacco Former Systems developer    Goals Met:  Independence with exercise equipment Exercise tolerated well No report of cardiac concerns or symptoms Strength training completed today  Goals Unmet:  Not Applicable  Comments: Pt able to follow exercise prescription today without complaint.  Will continue to monitor for progression.    Dr. Emily Filbert is Medical Director for [place] and [facility].

## 2021-01-30 ENCOUNTER — Encounter: Payer: Medicare Other | Admitting: *Deleted

## 2021-01-30 ENCOUNTER — Other Ambulatory Visit: Payer: Self-pay

## 2021-01-30 DIAGNOSIS — Z951 Presence of aortocoronary bypass graft: Secondary | ICD-10-CM

## 2021-01-30 NOTE — Progress Notes (Signed)
Daily Session Note  Patient Details  Name: Andrew Richardson MRN: [id number] Date of Birth: [birth year] Referring Provider:   Flowsheet Row Cardiac Rehab from 12/01/2020 in [facility] Cardiac and Pulmonary Rehab  Referring Provider Isaias Cowman MD      Encounter Date: 01/30/2021  Check In:  Session Check In - 01/30/21 0926      Check-In   Supervising physician immediately available to respond to emergencies See telemetry face sheet for immediately available ER MD    Location [facility]-Cardiac & Pulmonary Rehab    Staff Present Heath Lark, RN, BSN, CCRP;Jessica Adams, MA, RCEP, CCRP, CCET;Joseph [place] RCP,RRT,BSRT    Virtual Visit No    Medication changes reported     No    Fall or balance concerns reported    No    Warm-up and Cool-down Performed on first and last piece of equipment    Resistance Training Performed Yes    VAD Patient? No    PAD/SET Patient? No      Pain Assessment   Currently in Pain? No/denies              Social History   Tobacco Use  Smoking Status Former Smoker  . Quit date: 05/19/1989  . Years since quitting: 31.7  Smokeless Tobacco Former Systems developer    Goals Met:  Independence with exercise equipment Exercise tolerated well No report of cardiac concerns or symptoms  Goals Unmet:  Not Applicable  Comments: Pt able to follow exercise prescription today without complaint.  Will continue to monitor for progression.    Dr. Emily Filbert is Medical Director for [place] and [facility].

## 2021-02-02 ENCOUNTER — Encounter: Payer: Medicare Other | Admitting: *Deleted

## 2021-02-02 ENCOUNTER — Other Ambulatory Visit: Payer: Self-pay

## 2021-02-02 DIAGNOSIS — Z951 Presence of aortocoronary bypass graft: Secondary | ICD-10-CM

## 2021-02-02 NOTE — Progress Notes (Signed)
Daily Session Note  Patient Details  Name: Andrew Richardson MRN: [id number] Date of Birth: 06/09/1941 Referring Provider:   Flowsheet Row Cardiac Rehab from 12/01/2020 in [facility] Cardiac and Pulmonary Rehab  Referring Provider Paraschos, Alexander MD      Encounter Date: 02/02/2021  Check In:  Session Check In - 02/02/21 0939      Check-In   Supervising physician immediately available to respond to emergencies See telemetry face sheet for immediately available ER MD    Location [facility]-Cardiac & Pulmonary Rehab    Staff Present Susanne Bice, RN, BSN, CCRP;Joseph Hood RCP,RRT,BSRT;Kelly Hayes, BS, ACSM CEP, Exercise Physiologist    Virtual Visit No    Medication changes reported     No    Fall or balance concerns reported    No    Warm-up and Cool-down Performed on first and last piece of equipment    Resistance Training Performed Yes    VAD Patient? No    PAD/SET Patient? No      Pain Assessment   Currently in Pain? No/denies              Social History   Tobacco Use  Smoking Status Former Smoker  . Quit date: 05/19/1989  . Years since quitting: 31.7  Smokeless Tobacco Former User    Goals Met:  Independence with exercise equipment Exercise tolerated well No report of cardiac concerns or symptoms  Goals Unmet:  Not Applicable  Comments: Pt able to follow exercise prescription today without complaint.  Will continue to monitor for progression.    Dr. Mark Miller is Medical Director for [facility] and [facility]. 

## 2021-02-03 NOTE — Patient Instructions (Signed)
Discharge Patient Instructions  Patient Details  Name: Andrew Richardson MRN: [id number] Date of Birth: [birth year] Referring Provider:  Isaias Cowman, MD   Number of Visits: 65  Reason for Discharge:  Patient reached a stable level of exercise. Patient independent in their exercise. Patient has met program and personal goals.  Smoking History:  Social History   Tobacco Use  Smoking Status Former Smoker  . Quit date: 05/19/1989  . Years since quitting: 31.7  Smokeless Tobacco Former User    Diagnosis:  S/P CABG x 4  Initial Exercise Prescription:  Initial Exercise Prescription - 12/01/20 1200      Date of Initial Exercise RX and Referring Provider   Date 12/01/20    Referring Provider Paraschos, Alexander MD      Treadmill   MPH 1.4    Grade 0    Minutes 15    METs 2.07      NuStep   Level 1    SPM 80    Minutes 15    METs 2      T5 Nustep   Level 1    SPM 80    Minutes 15    METs 2      Biostep-RELP   Level 1    SPM 50    Minutes 15    METs 2      Prescription Details   Frequency (times per week) 3    Duration Progress to 30 minutes of continuous aerobic without signs/symptoms of physical distress      Intensity   THRR 40-80% of Max Heartrate 87-122    Ratings of Perceived Exertion 11-13    Perceived Dyspnea 0-4      Progression   Progression Continue to progress workloads to maintain intensity without signs/symptoms of physical distress.      Resistance Training   Training Prescription Yes    Weight 4 lb    Reps 10-15           Discharge Exercise Prescription (Final Exercise Prescription Changes):  Exercise Prescription Changes - 01/28/21 0800      Response to Exercise   Blood Pressure (Admit) 126/74    Blood Pressure (Exercise) 128/74    Blood Pressure (Exit) 118/60    Heart Rate (Admit) 63 bpm    Heart Rate (Exercise) 93 bpm    Heart Rate (Exit) 64 bpm    Rating of Perceived Exertion (Exercise) 15    Symptoms none     Duration Continue with 30 min of aerobic exercise without signs/symptoms of physical distress.    Intensity THRR unchanged      Progression   Progression Continue to progress workloads to maintain intensity without signs/symptoms of physical distress.    Average METs 2.65      Resistance Training   Training Prescription Yes    Weight 5 lb    Reps 10-15           Functional Capacity:  6 Minute Walk    Row Name 12/01/20 1207 01/26/21 1030       6 Minute Walk   Phase Initial Discharge    Distance 785 feet 915 feet    Distance % Change -- 130 %    Distance Feet Change -- 16.5 ft    Walk Time 6 minutes 6 minutes    # of Rest Breaks 0 0    MPH 1.49 1.73    METS 1.17 1.63    RPE 13 13    Perceived  Dyspnea  1 --    VO2 Peak 4.09 5.7    Symptoms Yes (comment) Yes (comment)    Comments some leg weakness 1/10, using handrail hip pain 3/10 (chronic)    Resting HR 52 bpm 58 bpm    Resting BP 132/62 130/70    Resting Oxygen Saturation  97 % --    Exercise Oxygen Saturation  during 6 min walk 96 % --    Max Ex. HR 83 bpm 91 bpm    Max Ex. BP 136/76 162/66    2 Minute Post BP 132/74 --

## 2021-02-04 ENCOUNTER — Other Ambulatory Visit: Payer: Self-pay

## 2021-02-04 DIAGNOSIS — Z951 Presence of aortocoronary bypass graft: Secondary | ICD-10-CM

## 2021-02-04 NOTE — Progress Notes (Signed)
Daily Session Note  Patient Details  Name: Andrew Richardson MRN: [id number] Date of Birth: 1941/01/16 Referring Provider:   Flowsheet Row Cardiac Rehab from 12/01/2020 in [facility] Cardiac [facility]  Referring Provider Isaias Cowman MD      Encounter Date: 02/04/2021  Check In:  Session Check In - 02/04/21 0921      Check-In   Supervising physician immediately available to respond to emergencies See telemetry face sheet for immediately available ER MD    Location [facility]-Cardiac & Pulmonary Rehab    Staff Present Birdie Sons, MPA, Elveria Rising, BA, ACSM CEP, Exercise Physiologist;Joseph Tessie Fass RCP,RRT,BSRT    Virtual Visit No    Medication changes reported     No    Fall or balance concerns reported    No    Tobacco Cessation No Change    Warm-up and Cool-down Performed on first and last piece of equipment    Resistance Training Performed Yes    VAD Patient? No    PAD/SET Patient? No      Pain Assessment   Currently in Pain? No/denies              Social History   Tobacco Use  Smoking Status Former Smoker  . Quit date: 05/19/1989  . Years since quitting: 31.7  Smokeless Tobacco Former Systems developer    Goals Met:  Independence with exercise equipment Exercise tolerated well No report of cardiac concerns or symptoms Strength training completed today  Goals Unmet:  Not Applicable  Comments: Pt able to follow exercise prescription today without complaint.  Will continue to monitor for progression.    Dr. Emily Filbert is Medical Director for [place] and [facility].

## 2021-02-09 ENCOUNTER — Other Ambulatory Visit: Payer: Self-pay

## 2021-02-09 ENCOUNTER — Encounter: Payer: Medicare Other | Admitting: *Deleted

## 2021-02-09 DIAGNOSIS — Z951 Presence of aortocoronary bypass graft: Secondary | ICD-10-CM | POA: Diagnosis not present

## 2021-02-09 NOTE — Progress Notes (Signed)
Discharge Progress Report  Patient Details  Name: Andrew Richardson MRN: [id number] Date of Birth: October 03, 1940 Referring Provider:   Flowsheet Row Cardiac Rehab from 12/01/2020 in [facility]  Referring Provider Isaias Cowman MD       Number of Visits: 36  Reason for Discharge:  Patient reached a stable level of exercise. Patient independent in their exercise.  Diagnosis:  S/P CABG x 4   Initial Exercise Prescription:  Initial Exercise Prescription - 12/01/20 1200      Date of Initial Exercise RX and Referring Provider   Date 12/01/20    Referring Provider Isaias Cowman MD      Treadmill   MPH 1.4    Grade 0    Minutes 15    METs 2.07      NuStep   Level 1    SPM 80    Minutes 15    METs 2      T5 Nustep   Level 1    SPM 80    Minutes 15    METs 2      Biostep-RELP   Level 1    SPM 50    Minutes 15    METs 2      Prescription Details   Frequency (times per week) 3    Duration Progress to 30 minutes of continuous aerobic without signs/symptoms of physical distress      Intensity   THRR 40-80% of Max Heartrate 87-122    Ratings of Perceived Exertion 11-13    Perceived Dyspnea 0-4      Progression   Progression Continue to progress workloads to maintain intensity without signs/symptoms of physical distress.      Resistance Training   Training Prescription Yes    Weight 4 lb    Reps 10-15           Discharge Exercise Prescription (Final Exercise Prescription Changes):  Exercise Prescription Changes - 01/28/21 0800      Response to Exercise   Blood Pressure (Admit) 126/74    Blood Pressure (Exercise) 128/74    Blood Pressure (Exit) 118/60    Heart Rate (Admit) 63 bpm    Heart Rate (Exercise) 93 bpm    Heart Rate (Exit) 64 bpm    Rating of Perceived Exertion (Exercise) 15    Symptoms none    Duration Continue with 30 min of aerobic exercise without signs/symptoms of physical distress.    Intensity THRR  unchanged      Progression   Progression Continue to progress workloads to maintain intensity without signs/symptoms of physical distress.    Average METs 2.65      Resistance Training   Training Prescription Yes    Weight 5 lb    Reps 10-15           Functional Capacity:  6 Minute Walk    Row Name 12/01/20 1207 01/26/21 1030       6 Minute Walk   Phase Initial Discharge    Distance 785 feet 915 feet    Distance % Change -- 130 %    Distance Feet Change -- 16.5 ft    Walk Time 6 minutes 6 minutes    # of Rest Breaks 0 0    MPH 1.49 1.73    METS 1.17 1.63    RPE 13 13    Perceived Dyspnea  1 --    VO2 Peak 4.09 5.7    Symptoms Yes (comment) Yes (comment)  Comments some leg weakness 1/10, using handrail hip pain 3/10 (chronic)    Resting HR 52 bpm 58 bpm    Resting BP 132/62 130/70    Resting Oxygen Saturation  97 % --    Exercise Oxygen Saturation  during 6 min walk 96 % --    Max Ex. HR 83 bpm 91 bpm    Max Ex. BP 136/76 162/66    2 Minute Post BP 132/74 --            Nutrition & Weight - Outcomes:  Pre Biometrics - 12/01/20 1213      Pre Biometrics   Height 5' 7.25" (1.708 m)    Weight 187 lb 14.4 oz (85.2 kg)    BMI (Calculated) 29.22    Single Leg Stand 1.2 seconds            Nutrition:  Nutrition Therapy & Goals - 12/01/20 1214      Intervention Plan   Intervention Prescribe, educate and counsel regarding individualized specific dietary modifications aiming towards targeted core components such as weight, hypertension, lipid management, diabetes, heart failure and other comorbidities.;Nutrition handout(s) given to patient.    Expected Outcomes Short Term Goal: Understand basic principles of dietary content, such as calories, fat, sodium, cholesterol and nutrients.;Short Term Goal: A plan has been developed with personal nutrition goals set during dietitian appointment.;Long Term Goal: Adherence to prescribed nutrition plan.          Goals  reviewed with patient; copy given to patient.

## 2021-02-09 NOTE — Progress Notes (Signed)
Daily Session Note  Patient Details  Name: Andrew Richardson MRN: [id number] Date of Birth: [birth year] Referring Provider:   Flowsheet Row Cardiac Rehab from 12/01/2020 in [facility] Cardiac and Pulmonary Rehab  Referring Provider Isaias Cowman MD      Encounter Date: 02/09/2021  Check In:  Session Check In - 02/09/21 0921      Check-In   Supervising physician immediately available to respond to emergencies See telemetry face sheet for immediately available ER MD    Location [facility]-Cardiac & Pulmonary Rehab    Staff Present Heath Lark, RN, BSN, CCRP;Joseph Hood RCP,RRT,BSRT;Kelly [place], [place], ACSM CEP, Exercise Physiologist    Virtual Visit No    Medication changes reported     No    Fall or balance concerns reported    No    Warm-up and Cool-down Performed on first and last piece of equipment    Resistance Training Performed Yes    VAD Patient? No    PAD/SET Patient? No      Pain Assessment   Currently in Pain? No/denies              Social History   Tobacco Use  Smoking Status Former Smoker  . Quit date: 05/19/1989  . Years since quitting: 31.7  Smokeless Tobacco Former Systems developer    Goals Met:  Independence with exercise equipment Exercise tolerated well Personal goals reviewed No report of cardiac concerns or symptoms  Goals Unmet:  Not Applicable  Comments:  Andrew Richardson graduated today from  rehab with 36 sessions completed.  Details of the patient's exercise prescription and what He needs to do in order to continue the prescription and progress were discussed with patient.  Patient was given a copy of prescription and goals.  Patient verbalized understanding.  Andrew Richardson plans to continue to exercise by walkimg at home, gardening and joining [employer].    Dr. Emily Filbert is Medical Director for [name] and [facility].

## 2021-02-09 NOTE — Progress Notes (Signed)
Cardiac Individual Treatment Plan  Patient Details  Name: MELFORD SAWHNEY MRN: [id number] Date of Birth: 01/27/1941 Referring Provider:   Flowsheet Row Cardiac Rehab from 12/01/2020 in [facility] Cardiac and Pulmonary Rehab  Referring Provider Isaias Cowman MD      Initial Encounter Date:  Flowsheet Row Cardiac Rehab from 12/01/2020 in [facility] Cardiac and Pulmonary Rehab  Date 12/01/20      Visit Diagnosis: S/P CABG x 4  Patient's Home Medications on Admission:  Current Outpatient Medications:  .  amiodarone (PACERONE) 200 MG tablet, Take 1 tablet (200 mg total) by mouth 2 (two) times daily. For 10 days;then take 200 mg daily thereafter, Disp: 60 tablet, Rfl: 1 .  amLODipine (NORVASC) 5 MG tablet, Take 5 mg by mouth every evening., Disp: , Rfl:  .  aspirin EC 325 MG EC tablet, Take 1 tablet (325 mg total) by mouth daily., Disp: 30 tablet, Rfl: 0 .  atorvastatin (LIPITOR) 10 MG tablet, Take 10 mg by mouth every evening., Disp: , Rfl:  .  levothyroxine (SYNTHROID) 50 MCG tablet, Take 50 mcg by mouth daily., Disp: , Rfl:  .  losartan (COZAAR) 100 MG tablet, Take 100 mg by mouth every evening., Disp: , Rfl:  .  metoprolol tartrate (LOPRESSOR) 25 MG tablet, Take 1 tablet (25 mg total) by mouth 2 (two) times daily., Disp: 60 tablet, Rfl: 1 .  polyvinyl alcohol (LIQUIFILM TEARS) 1.4 % ophthalmic solution, Place 1 drop into both eyes daily as needed for dry eyes., Disp: , Rfl:  .  tamsulosin (FLOMAX) 0.4 MG CAPS capsule, Take 0.4 mg by mouth daily., Disp: , Rfl:  .  traMADol (ULTRAM) 50 MG tablet, Take 1 tablet (50 mg total) by mouth every 6 (six) hours as needed for moderate pain., Disp: 50 tablet, Rfl: 0  Past Medical History: Past Medical History:  Diagnosis Date  . Colon polyps   . Coronary artery disease   . Depression   . Headache   . Hyperlipemia   . Hypertension   . Hypothyroid   . Prediabetes   . 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 Tubular adenoma of colon      Tobacco Use: Social History   Tobacco Use  Smoking Status Former Smoker  . Quit date: 05/19/1989  . 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 Latest Ref Rng & Units 10/08/2020 10/08/2020 10/08/2020 10/08/2020 10/09/2020   Cholestrol 0 - 200 mg/dL - - - - -   LDLCALC 0 - 99 mg/dL - - - - -   HDL >40 mg/dL - - - - -   Trlycerides <150 mg/dL - - - - -   Hemoglobin A1c 4.8 - 5.6 % - - - - -   PHART 7.350 - 7.450 - 7.387 7.282(L) 7.259(L) 7.327(L)   PCO2ART 32.0 - 48.0 mmHg - 38.9 46.1 49.5(H) 40.7   HCO3 20.0 - 28.0 mmol/L - 23.5 21.8 22.1 21.1   TCO2 22 - 32 mmol/L 28 25 23 24 22    ACIDBASEDEF 0.0 - 2.0 mmol/L - 1.0 5.0(H) 5.0(H) 4.0(H)   O2SAT % - 100.0 97.0 98.0 97.0       Exercise Target Goals: Exercise Program Goal: Individual exercise prescription set using results from initial 6 min walk test and THRR while considering  patient's activity barriers and safety.   Exercise Prescription Goal: Initial exercise prescription builds to  30-45 minutes a day of aerobic activity, 2-3 days per week.  Home exercise guidelines will be given to patient during program as part of exercise prescription that the participant will acknowledge.   Education: Aerobic Exercise: - Group verbal and visual presentation on the components of exercise prescription. Introduces F.I.T.T principle from ACSM for exercise prescriptions.  Reviews F.I.T.T. principles of aerobic exercise including progression. Written material given at graduation. Flowsheet Row Cardiac Rehab from 01/28/2021 in [facility] Cardiac and Pulmonary Rehab  Education need identified 12/01/20  Date 01/14/21  Educator AS  Instruction Review Code 1- Verbalizes Understanding      Education: Resistance Exercise: - Group verbal and visual presentation on the components of exercise prescription. Introduces F.I.T.T principle from ACSM for exercise  prescriptions  Reviews F.I.T.T. principles of resistance exercise including progression. Written material given at graduation. Flowsheet Row Cardiac Rehab from 01/28/2021 in [facility] Cardiac and Pulmonary Rehab  Date 01/21/21  Educator AS  Instruction Review Code 1- Verbalizes Understanding       Education: Exercise & Equipment Safety: - Individual verbal instruction and demonstration of equipment use and safety with use of the equipment. Flowsheet Row Cardiac Rehab from 01/28/2021 in [facility] Cardiac and Pulmonary Rehab  Date 12/01/20  Educator [facility]  Instruction Review Code 1- Verbalizes Understanding      Education: Exercise Physiology & General Exercise Guidelines: - Group verbal and written instruction with models to review the exercise physiology of the cardiovascular system and associated critical values. Provides general exercise guidelines with specific guidelines to those with heart or lung disease.  Flowsheet Row Cardiac Rehab from 01/28/2021 in [facility] Cardiac and Pulmonary Rehab  Date 01/07/21  Educator [facility]  Instruction Review Code 1- Verbalizes Understanding      Education: Flexibility, Balance, Mind/Body Relaxation: - Group verbal and visual presentation with interactive activity on the components of exercise prescription. Introduces F.I.T.T principle from ACSM for exercise prescriptions. Reviews F.I.T.T. principles of flexibility and balance exercise training including progression. Also discusses the mind body connection.  Reviews various relaxation techniques to help reduce and manage stress (i.e. Deep breathing, progressive muscle relaxation, and visualization). Balance handout provided to take home. Written material given at graduation. Flowsheet Row Cardiac Rehab from 01/28/2021 in [facility] Cardiac and Pulmonary Rehab  Date 01/28/21  Educator AS  Instruction Review Code 1- Verbalizes Understanding      Activity Barriers & Risk Stratification:  Activity Barriers & Cardiac Risk  Stratification - 12/01/20 1209      Activity Barriers & Cardiac Risk Stratification   Activity Barriers Incisional Pain;Deconditioning;Muscular Weakness;Balance Concerns    Cardiac Risk Stratification High           6 Minute Walk:  6 Minute Walk    Row Name 12/01/20 1207 01/26/21 1030       6 Minute Walk   Phase Initial Discharge    Distance 785 feet 915 feet    Distance % Change -- 130 %    Distance Feet Change -- 16.5 ft    Walk Time 6 minutes 6 minutes    # of Rest Breaks 0 0    MPH 1.49 1.73    METS 1.17 1.63    RPE 13 13    Perceived Dyspnea  1 --    VO2 Peak 4.09 5.7    Symptoms Yes (comment) Yes (comment)    Comments some leg weakness 1/10, using handrail hip pain 3/10 (chronic)    Resting HR 52 bpm 58 bpm    Resting BP 132/62  130/70    Resting Oxygen Saturation  97 % --    Exercise Oxygen Saturation  during 6 min walk 96 % --    Max Ex. HR 83 bpm 91 bpm    Max Ex. BP 136/76 162/66    2 Minute Post BP 132/74 --           Oxygen Initial Assessment:   Oxygen Re-Evaluation:   Oxygen Discharge (Final Oxygen Re-Evaluation):   Initial Exercise Prescription:  Initial Exercise Prescription - 12/01/20 1200      Date of Initial Exercise RX and Referring Provider   Date 12/01/20    Referring Provider Isaias Cowman MD      Treadmill   MPH 1.4    Grade 0    Minutes 15    METs 2.07      NuStep   Level 1    SPM 80    Minutes 15    METs 2      T5 Nustep   Level 1    SPM 80    Minutes 15    METs 2      Biostep-RELP   Level 1    SPM 50    Minutes 15    METs 2      Prescription Details   Frequency (times per week) 3    Duration Progress to 30 minutes of continuous aerobic without signs/symptoms of physical distress      Intensity   THRR 40-80% of Max Heartrate 87-122    Ratings of Perceived Exertion 11-13    Perceived Dyspnea 0-4      Progression   Progression Continue to progress workloads to maintain intensity without  signs/symptoms of physical distress.      Resistance Training   Training Prescription Yes    Weight 4 lb    Reps 10-15           Perform Capillary Blood Glucose checks as needed.  Exercise Prescription Changes:  Exercise Prescription Changes    Row Name 12/01/20 1200 12/16/20 1500 12/31/20 1300 01/14/21 1700 01/28/21 0800     Response to Exercise   Blood Pressure (Admit) 132/62 110/60 152/72 138/82 126/74   Blood Pressure (Exercise) 136/76 130/70 118/60 150/82 128/74   Blood Pressure (Exit) 132/74 102/52 124/66 122/60 118/60   Heart Rate (Admit) 52 bpm 54 bpm 59 bpm 57 bpm 63 bpm   Heart Rate (Exercise) 83 bpm 86 bpm 86 bpm 92 bpm 93 bpm   Heart Rate (Exit) 54 bpm 57 bpm 61 bpm 66 bpm 64 bpm   Oxygen Saturation (Admit) 97 % -- -- -- --   Oxygen Saturation (Exercise) 96 % -- -- -- --   Rating of Perceived Exertion (Exercise) 13 12 12 12 15    Perceived Dyspnea (Exercise) 1 -- -- -- --   Symptoms leg weakness 1/10 none none none none   Comments walk test results -- -- -- --   Duration -- Continue with 30 min of aerobic exercise without signs/symptoms of physical distress. Continue with 30 min of aerobic exercise without signs/symptoms of physical distress. Continue with 30 min of aerobic exercise without signs/symptoms of physical distress. Continue with 30 min of aerobic exercise without signs/symptoms of physical distress.   Intensity -- THRR unchanged THRR unchanged THRR unchanged THRR unchanged     Progression   Progression -- Continue to progress workloads to maintain intensity without signs/symptoms of physical distress. Continue to progress workloads to maintain intensity without signs/symptoms of physical distress.  Continue to progress workloads to maintain intensity without signs/symptoms of physical distress. Continue to progress workloads to maintain intensity without signs/symptoms of physical distress.   Average METs -- 2.37 2 2.6 2.65     Resistance Training    Training Prescription -- Yes Yes Yes Yes   Weight -- 4 lb 4 lb 4 lb 5 lb   Reps -- 10-15 10-15 10-15 10-15     Interval Training   Interval Training -- No No No --     Treadmill   MPH -- 1.4 1.4 1.6 --   Grade -- 0 -- -- --   Minutes -- 15 15 15  --   METs -- 2.07 2.07 2.23 --     Recumbant Bike   Level -- -- -- 1 --   Minutes -- -- -- 15 --   METs -- -- -- 2.89 --     NuStep   Level -- 3 -- -- --   Minutes -- 15 -- -- --   METs -- 2.4 -- -- --     T5 Nustep   Level -- 3 5 -- --   Minutes -- 15 15 -- --   METs -- 2 2 -- --     Biostep-RELP   Level -- 3 -- -- --   Minutes -- 15 -- -- --   METs -- 3 -- -- --          Exercise Comments:  Exercise Comments    Row Name 12/03/20 [phone]           Exercise Comments First full day of exercise!  Patient was oriented to gym and equipment including functions, settings, policies, and procedures.  Patient's individual exercise prescription and treatment plan were reviewed.  All starting workloads were established based on the results of the 6 minute walk test done at initial orientation visit.  The plan for exercise progression was also introduced and progression will be customized based on patient's performance and goals.              Exercise Goals and Review:  Exercise Goals    Row Name 12/01/20 1213             Exercise Goals   Increase Physical Activity Yes       Intervention Provide advice, education, support and counseling about physical activity/exercise needs.;Develop an individualized exercise prescription for aerobic and resistive training based on initial evaluation findings, risk stratification, comorbidities and participant's personal goals.       Expected Outcomes Short Term: Attend rehab on a regular basis to increase amount of physical activity.;Long Term: Add in home exercise to make exercise part of routine and to increase amount of physical activity.;Long Term: Exercising regularly at least 3-5 days a  week.       Increase Strength and Stamina Yes       Intervention Provide advice, education, support and counseling about physical activity/exercise needs.;Develop an individualized exercise prescription for aerobic and resistive training based on initial evaluation findings, risk stratification, comorbidities and participant's personal goals.       Expected Outcomes Short Term: Increase workloads from initial exercise prescription for resistance, speed, and METs.;Short Term: Perform resistance training exercises routinely during rehab and add in resistance training at home;Long Term: Improve cardiorespiratory fitness, muscular endurance and strength as measured by increased METs and functional capacity (6MWT)       Able to understand and use rate of perceived exertion (RPE) scale Yes  Intervention Provide education and explanation on how to use RPE scale       Expected Outcomes Short Term: Able to use RPE daily in rehab to express subjective intensity level;Long Term:  Able to use RPE to guide intensity level when exercising independently       Able to understand and use Dyspnea scale Yes       Intervention Provide education and explanation on how to use Dyspnea scale       Expected Outcomes Long Term: Able to use Dyspnea scale to guide intensity level when exercising independently;Short Term: Able to use Dyspnea scale daily in rehab to express subjective sense of shortness of breath during exertion       Knowledge and understanding of Target Heart Rate Range (THRR) Yes       Intervention Provide education and explanation of THRR including how the numbers were predicted and where they are located for reference       Expected Outcomes Short Term: Able to state/look up THRR;Short Term: Able to use daily as guideline for intensity in rehab;Long Term: Able to use THRR to govern intensity when exercising independently       Able to check pulse independently Yes       Intervention Provide education and  demonstration on how to check pulse in carotid and radial arteries.;Review the importance of being able to check your own pulse for safety during independent exercise       Expected Outcomes Short Term: Able to explain why pulse checking is important during independent exercise;Long Term: Able to check pulse independently and accurately       Understanding of Exercise Prescription Yes       Intervention Provide education, explanation, and written materials on patient's individual exercise prescription       Expected Outcomes Short Term: Able to explain program exercise prescription;Long Term: Able to explain home exercise prescription to exercise independently              Exercise Goals Re-Evaluation :  Exercise Goals Re-Evaluation    Row Name 12/03/20 0925 12/16/20 1527 12/31/20 1357 01/14/21 1729 01/26/21 0949     Exercise Goal Re-Evaluation   Exercise Goals Review Increase Physical Activity;Able to understand and use rate of perceived exertion (RPE) scale;Knowledge and understanding of Target Heart Rate Range (THRR);Understanding of Exercise Prescription;Increase Strength and Stamina;Able to understand and use Dyspnea scale;Able to check pulse independently Increase Physical Activity;Increase Strength and Stamina;Understanding of Exercise Prescription Increase Physical Activity;Increase Strength and Stamina Increase Physical Activity;Increase Strength and Stamina Increase Physical Activity;Increase Strength and Stamina   Comments Reviewed RPE and dyspnea scales, THR and program prescription with pt today.  Pt voiced understanding and was given a copy of goals to take home. Timmothy Sours has been doing well in rehab.  He is up to level 3 on the seated equipment.  We will continue to monitor his porgress. Timmothy Sours has increased to level 5 on T5.  He wants to build leg strength.Walking is still challenging for him.  Staff will monitor progress. Timmothy Sours has increased TM speed to 1.6.  He is also using 5 lb weights for  strength work.  We will continue to monitor progress. Don improved 6MWT.  He plans to join [employer] to maintain exercise.   Expected Outcomes Short: Use RPE daily to regulate intensity. Long: Follow program prescription in THR. Short: Try to add incline to treadmill Long: Continue to improve stamina Short:  work on TM/walking Long:  Conitnue to build  stamina Short:  work on walking stamina Long: follow program prescription Short:  join [place]:  maintain exercise on his own          Discharge Exercise Prescription (Final Exercise Prescription Changes):  Exercise Prescription Changes - 01/28/21 0800      Response to Exercise   Blood Pressure (Admit) 126/74    Blood Pressure (Exercise) 128/74    Blood Pressure (Exit) 118/60    Heart Rate (Admit) 63 bpm    Heart Rate (Exercise) 93 bpm    Heart Rate (Exit) 64 bpm    Rating of Perceived Exertion (Exercise) 15    Symptoms none    Duration Continue with 30 min of aerobic exercise without signs/symptoms of physical distress.    Intensity THRR unchanged      Progression   Progression Continue to progress workloads to maintain intensity without signs/symptoms of physical distress.    Average METs 2.65      Resistance Training   Training Prescription Yes    Weight 5 lb    Reps 10-15           Nutrition:  Target Goals: Understanding of nutrition guidelines, daily intake of sodium 1500mg , cholesterol 200mg , calories 30% from fat and 7% or less from saturated fats, daily to have 5 or more servings of fruits and vegetables.  Education: All About Nutrition: -Group instruction provided by verbal, written material, interactive activities, discussions, models, and posters to present general guidelines for heart healthy nutrition including fat, fiber, MyPlate, the role of sodium in heart healthy nutrition, utilization of the nutrition label, and utilization of this knowledge for meal planning. Follow up email sent as well.  Written material given at graduation.   Biometrics:  Pre Biometrics - 12/01/20 1213      Pre Biometrics   Height 5' 7.25" (1.708 m)    Weight 187 lb 14.4 oz (85.2 kg)    BMI (Calculated) 29.22    Single Leg Stand 1.2 seconds            Nutrition Therapy Plan and Nutrition Goals:  Nutrition Therapy & Goals - 12/01/20 1214      Intervention Plan   Intervention Prescribe, educate and counsel regarding individualized specific dietary modifications aiming towards targeted core components such as weight, hypertension, lipid management, diabetes, heart failure and other comorbidities.;Nutrition handout(s) given to patient.    Expected Outcomes Short Term Goal: Understand basic principles of dietary content, such as calories, fat, sodium, cholesterol and nutrients.;Short Term Goal: A plan has been developed with personal nutrition goals set during dietitian appointment.;Long Term Goal: Adherence to prescribed nutrition plan.           Nutrition Assessments:  MEDIFICTS Score Key:  ?70 Need to make dietary changes   40-70 Heart Healthy Diet  ? 40 Therapeutic Level Cholesterol Diet  Flowsheet Row Cardiac Rehab from 02/02/2021 in [facility] Cardiac and Pulmonary Rehab  Picture Your Plate Total Score on Admission 75  Picture Your Plate Total Score on Discharge 71     Picture Your Plate Scores:  [id number] Unhealthy dietary pattern with much room for improvement.  41-50 Dietary pattern unlikely to meet recommendations for good health and room for improvement.  51-60 More healthful dietary pattern, with some room for improvement.   >60 Healthy dietary pattern, although there may be some specific behaviors that could be improved.    Nutrition Goals Re-Evaluation:  Nutrition Goals Re-Evaluation    [place] Name 01/02/21 [phone]  Goals   Current Weight 192 lb (87.1 kg)       Nutrition Goal Lose weight. Cut back on meat intake.       Comment Timmothy Sours wants to lose some weight but feels  like he eats to much meat. His portions sizes may be larger than what he needs. He is willing to try to cut back on his portion sizes. Informed Timmothy Sours that using a smaller plate may help with his portion control.       Expected Outcome Short: cut back on portion sizes. Long: lose weight.              Nutrition Goals Discharge (Final Nutrition Goals Re-Evaluation):  Nutrition Goals Re-Evaluation - 01/02/21 0935      Goals   Current Weight 192 lb (87.1 kg)    Nutrition Goal Lose weight. Cut back on meat intake.    Comment Timmothy Sours wants to lose some weight but feels like he eats to much meat. His portions sizes may be larger than what he needs. He is willing to try to cut back on his portion sizes. Informed Timmothy Sours that using a smaller plate may help with his portion control.    Expected Outcome Short: cut back on portion sizes. Long: lose weight.           Psychosocial: Target Goals: Acknowledge presence or absence of significant depression and/or stress, maximize coping skills, provide positive support system. Participant is able to verbalize types and ability to use techniques and skills needed for reducing stress and depression.   Education: Stress, Anxiety, and Depression - Group verbal and visual presentation to define topics covered.  Reviews how body is impacted by stress, anxiety, and depression.  Also discusses healthy ways to reduce stress and to treat/manage anxiety and depression.  Written material given at graduation. Flowsheet Row Cardiac Rehab from 01/28/2021 in [facility] Cardiac and Pulmonary Rehab  Date 12/31/20  Educator [facility]  Instruction Review Code 1- [employer] Understanding      Education: Sleep Hygiene -Provides group verbal and written instruction about how sleep can affect your health.  Define sleep hygiene, discuss sleep cycles and impact of sleep habits. Review good sleep hygiene tips.    Initial Review & Psychosocial Screening:  Initial Psych Review & Screening - 11/19/20  1405      Initial Review   Current issues with Current Abuse or Neglect to Report;Current Anxiety/Panic;Current Stress Concerns    Source of Stress Concerns Unable to participate in former interests or hobbies;Unable to perform yard/household activities      [place]? Yes   wife and good friends     Barriers   Psychosocial barriers to participate in program There are no identifiable barriers or psychosocial needs.;The patient should benefit from training in stress management and relaxation.      Screening Interventions   Interventions Encouraged to exercise;Provide feedback about the scores to participant;To provide support and resources with identified psychosocial needs    Expected Outcomes Short Term goal: Utilizing psychosocial counselor, staff and physician to assist with identification of specific Stressors or current issues interfering with healing process. Setting desired goal for each stressor or current issue identified.;Long Term Goal: Stressors or current issues are controlled or eliminated.;Short Term goal: Identification and review with participant of any Quality of Life or Depression concerns found by scoring the questionnaire.;Long Term goal: The participant improves quality of Life and PHQ9 Scores as seen by post scores and/or verbalization of changes  Quality of Life Scores:   Quality of Life - 02/02/21 0940      Quality of Life Scores   Health/Function Pre 17.27 %    Health/Function Post 23.17 %    Health/Function % Change 34.16 %    Socioeconomic Pre 25 %    Socioeconomic Post 27.29 %    Socioeconomic % Change  9.16 %    Psych/Spiritual Pre 14.71 %    Psych/Spiritual Post 20.21 %    Psych/Spiritual % Change 37.39 %    Family Pre 23.13 %    Family Post 24.63 %    Family % Change 6.49 %    GLOBAL Pre 19.08 %    GLOBAL Post 23.59 %    GLOBAL % Change 23.64 %          Scores of 19 and below usually indicate a poorer  quality of life in these areas.  A difference of  2-3 points is a clinically meaningful difference.  A difference of 2-3 points in the total score of the Quality of Life Index has been associated with significant improvement in overall quality of life, self-image, physical symptoms, and general health in studies assessing change in quality of life.  PHQ-9: Recent Review Flowsheet Data    Depression screen [facility] 2/9 02/02/2021 01/26/2021 12/31/2020 12/01/2020   Decreased Interest 0 0 1 1   Down, Depressed, Hopeless 0 0 1 1   PHQ - 2 Score 0 0 2 2   Altered sleeping 0 0 0 0   Tired, decreased energy 2 3 2 1    Change in appetite 1 0 0 1   Feeling bad or failure about yourself  1 1 1 2    Trouble concentrating 1 0 0 2   Moving slowly or fidgety/restless 0 0 0 1   Suicidal thoughts 0 0 0 1   PHQ-9 Score 5 4 5 10    Difficult doing work/chores Not difficult at all Not difficult at all Not difficult at all Somewhat difficult     Interpretation of Total Score  Total Score Depression Severity:  1-4 = Minimal depression, 5-9 = Mild depression, 10-14 = Moderate depression, 15-19 = Moderately severe depression, 20-27 = Severe depression   Psychosocial Evaluation and Intervention:  Psychosocial Evaluation - 11/19/20 1415      Psychosocial Evaluation & Interventions   Interventions Encouraged to exercise with the program and follow exercise prescription;Relaxation education    Comments Mr. 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 His wife and friends have helped take care of him and let him heal well. He was quite active before his surgery and hopes to gain back some of his stamina. He wants to maintain independent as long as possible. He did share that his chart says he has a history of depression but he doesn't remember ever being diagnosed with it. He does state that his anxiety has increased since surgery but is getting better. Sometimes he wakes up anxious and worrying about life but he focuses on  relaxing and taking deep breaths. He wants to attend cardiac rehab to boost his stamina and gain knowledge to help maintain a heart healthy lifestyle.    Expected Outcomes Short: attend cardiac rehab for education and exercise. Long: develop and maintain positive self care habits.    Continue Psychosocial Services  Follow up required by staff           Psychosocial Re-Evaluation:  Psychosocial Re-Evaluation    Byram Name 12/31/20 0935 01/26/21  0944           Psychosocial Re-Evaluation   Current issues with -- Current Stress Concerns      Comments Reviewed patient health questionnaire (PHQ-9) with patient for follow up. Previously, patients score indicated signs/symptoms of depression.  Reviewed to see if patient is improving symptom wise while in program.  Score improved and patient states that it is because he feels less anxious after his surgery and heart event. Reviewed PHQ - score improved to 4.  His main concern is feeling tired.  He says he "plugs along".      Expected Outcomes Short: Continue to attend [facility] regularly for regular exercise and social engagement. Long: Continue to improve symptoms and manage a positive mental state. Short:  maintain exercise to help with stress  Long: maintain positive outlook      Interventions Encouraged to attend Cardiac Rehabilitation for the exercise --      Continue Psychosocial Services  Follow up required by staff --             Psychosocial Discharge (Final Psychosocial Re-Evaluation):  Psychosocial Re-Evaluation - 01/26/21 0944      Psychosocial Re-Evaluation   Current issues with Current Stress Concerns    Comments Reviewed PHQ - score improved to 4.  His main concern is feeling tired.  He says he "plugs along".    Expected Outcomes Short:  maintain exercise to help with stress  Long: maintain positive outlook           Vocational Rehabilitation: Provide vocational rehab assistance to qualifying candidates.   Vocational  Rehab Evaluation & Intervention:  Vocational Rehab - 02/02/21 0941      Initial Vocational Rehab Evaluation & Intervention   Assessment shows need for Vocational Rehabilitation No      Discharge Vocational Rehab   Discharge Vocational Rehabilitation no need           Education: Education Goals: Education classes will be provided on a variety of topics geared toward better understanding of heart health and risk factor modification. Participant will state understanding/return demonstration of topics presented as noted by education test scores.  Learning Barriers/Preferences:  Learning Barriers/Preferences - 11/19/20 1415      Learning Barriers/Preferences   Learning Barriers Hearing    Learning Preferences None           General Cardiac Education Topics:  AED/CPR: - Group verbal and written instruction with the use of models to demonstrate the basic use of the AED with the basic ABC's of resuscitation.   Anatomy and Cardiac Procedures: - Group verbal and visual presentation and models provide information about basic cardiac anatomy and function. Reviews the testing methods done to diagnose heart disease and the outcomes of the test results. Describes the treatment choices: Medical Management, Angioplasty, or Coronary Bypass Surgery for treating various heart conditions including Myocardial Infarction, Angina, Valve Disease, and Cardiac Arrhythmias.  Written material given at graduation. Flowsheet Row Cardiac Rehab from 01/28/2021 in [facility] Cardiac and Pulmonary Rehab  Date 01/21/21  Educator SB  Instruction Review Code 1- Verbalizes Understanding      Medication Safety: - Group verbal and visual instruction to review commonly prescribed medications for heart and lung disease. Reviews the medication, class of the drug, and side effects. Includes the steps to properly store meds and maintain the prescription regimen.  Written material given at graduation. Flowsheet Row Cardiac  Rehab from 01/28/2021 in [facility] Cardiac and Pulmonary Rehab  Date 12/10/20  Educator SB  Instruction Review Code 1- Verbalizes Understanding      Intimacy: - Group verbal instruction through game format to discuss how heart and lung disease can affect sexual intimacy. Written material given at graduation.. Flowsheet Row Cardiac Rehab from 01/28/2021 in [facility] Cardiac and Pulmonary Rehab  Date 01/14/21  Educator AS  Instruction Review Code 1- Verbalizes Understanding      Know Your Numbers and Heart Failure: - Group verbal and visual instruction to discuss disease risk factors for cardiac and pulmonary disease and treatment options.  Reviews associated critical values for Overweight/Obesity, Hypertension, Cholesterol, and Diabetes.  Discusses basics of heart failure: signs/symptoms and treatments.  Introduces Heart Failure Zone chart for action plan for heart failure.  Written material given at graduation. Flowsheet Row Cardiac Rehab from 01/28/2021 in [facility] Cardiac and Pulmonary Rehab  Date 12/17/20  Educator SB  Instruction Review Code 1- Verbalizes Understanding      Infection Prevention: - Provides verbal and written material to individual with discussion of infection control including proper hand washing and proper equipment cleaning during exercise session. Flowsheet Row Cardiac Rehab from 01/28/2021 in [facility] Cardiac and Pulmonary Rehab  Date 12/01/20  Educator [facility]  Instruction Review Code 1- Verbalizes Understanding      Falls Prevention: - Provides verbal and written material to individual with discussion of falls prevention and safety. Flowsheet Row Cardiac Rehab from 01/28/2021 in [facility] Cardiac and Pulmonary Rehab  Date 12/01/20  Educator [facility]  Instruction Review Code 1- Verbalizes Understanding      Other: -Provides group and verbal instruction on various topics (see comments)   Knowledge Questionnaire Score:  Knowledge Questionnaire Score - 02/02/21 0941      Knowledge  Questionnaire Score   Pre Score 24/26 Education Focus: Pulse, exercise    Post Score 26/26           Core Components/Risk Factors/Patient Goals at Admission:  Personal Goals and Risk Factors at Admission - 12/01/20 1217      Core Components/Risk Factors/Patient Goals on Admission    Weight Management Yes;Weight Loss    Intervention Weight Management: Develop a combined nutrition and exercise program designed to reach desired caloric intake, while maintaining appropriate intake of nutrient and fiber, sodium and fats, and appropriate energy expenditure required for the weight goal.;Weight Management: Provide education and appropriate resources to help participant work on and attain dietary goals.;Weight Management/Obesity: Establish reasonable short term and long term weight goals.    Admit Weight 187 lb 14.4 oz (85.2 kg)    Goal Weight: Short Term 182 lb (82.6 kg)    Goal Weight: Long Term 175 lb (79.4 kg)    Expected Outcomes Short Term: Continue to assess and modify interventions until short term weight is achieved;Long Term: Adherence to nutrition and physical activity/exercise program aimed toward attainment of established weight goal;Understanding recommendations for meals to include 15-35% energy as protein, 25-35% energy from fat, 35-60% energy from carbohydrates, less than 200mg  of dietary cholesterol, 20-35 gm of total fiber daily;Understanding of distribution of calorie intake throughout the day with the consumption of 4-5 meals/snacks;Weight Loss: Understanding of general recommendations for a balanced deficit meal plan, which promotes 1-2 lb weight loss per week and includes a negative energy balance of [phone] kcal/d    Hypertension Yes    Intervention Provide education on lifestyle modifcations including regular physical activity/exercise, weight management, moderate sodium restriction and increased consumption of fresh fruit, vegetables, and low fat dairy, alcohol moderation, and  smoking cessation.;Monitor prescription use compliance.  Expected Outcomes Short Term: Continued assessment and intervention until BP is < 140/62mm HG in hypertensive participants. < 130/11mm HG in hypertensive participants with diabetes, heart failure or chronic kidney disease.;Long Term: Maintenance of blood pressure at goal levels.    Lipids Yes    Intervention Provide education and support for participant on nutrition & aerobic/resistive exercise along with prescribed medications to achieve LDL 70mg , HDL >40mg .    Expected Outcomes Short Term: Participant states understanding of desired cholesterol values and is compliant with medications prescribed. Participant is following exercise prescription and nutrition guidelines.;Long Term: Cholesterol controlled with medications as prescribed, with individualized exercise RX and with personalized nutrition plan. Value goals: LDL < 70mg , HDL > 40 mg.           Education:Diabetes - Individual verbal and written instruction to review signs/symptoms of diabetes, desired ranges of glucose level fasting, after meals and with exercise. Acknowledge that pre and post exercise glucose checks will be done for 3 sessions at entry of program.   Core Components/Risk Factors/Patient Goals Review:   Goals and Risk Factor Review    Row Name 01/02/21 0933 01/26/21 0941           Core Components/Risk Factors/Patient Goals Review   Personal Goals Review Weight Management/Obesity;Hypertension --      Review Timmothy Sours states that he is eating too much lately. He has a good appetite and wants to lose weight. Dons blood pressure has been stable in the upper 120s over 60s. Timmothy Sours would like to lose 10 pounds. Timmothy Sours will graduate soon.  He enjoyed learning structured exercise which he had not done before.  He has lost a couple pounds lately.  He has modified his diet and eats less red meat.      Expected Outcomes Short: lose 5 pounds in the couple weeks. Long: Lose 10 pounds.  Short:complete program Long maintain heart healthy habits once he completes program             Core Components/Risk Factors/Patient Goals at Discharge (Final Review):   Goals and Risk Factor Review - 01/26/21 0941      Core Components/Risk Factors/Patient Goals Review   Review Timmothy Sours will graduate soon.  He enjoyed learning structured exercise which he had not done before.  He has lost a couple pounds lately.  He has modified his diet and eats less red meat.    Expected Outcomes Short:complete program Long maintain heart healthy habits once he completes program           ITP Comments:  ITP Comments    Row Name 11/19/20 1419 12/01/20 1207 12/03/20 0925 12/17/20 0717 01/14/21 0929   ITP Comments Initial telephone orientation completed. Diagnosis can be found in [facility] 1/10. EP orientation scheduled for Monday 3/7 at 10am. Completed 6MWT and gym orientation. Initial ITP created and sent for review to Dr. Emily Filbert, Medical Director. First full day of exercise!  Patient was oriented to gym and equipment including functions, settings, policies, and procedures.  Patient's individual exercise prescription and treatment plan were reviewed.  All starting workloads were established based on the results of the 6 minute walk test done at initial orientation visit.  The plan for exercise progression was also introduced and progression will be customized based on patient's performance and goals. 30 Day review completed. Medical Director ITP review done, changes made as directed, and signed approval by Medical Director. 30 Day review completed. Medical Director ITP review done, changes made as directed, and signed approval by  Medical Director.          Comments: Discharge ITP

## 2021-03-01 ENCOUNTER — Other Ambulatory Visit: Payer: Self-pay | Admitting: Physician Assistant

## 2021-04-07 ENCOUNTER — Other Ambulatory Visit: Payer: Self-pay | Admitting: Physician Assistant

## 2021-04-15 ENCOUNTER — Other Ambulatory Visit: Payer: Self-pay | Admitting: Physician Assistant

## 2021-08-24 ENCOUNTER — Other Ambulatory Visit: Payer: Self-pay | Admitting: Physician Assistant

## 2022-09-24 IMAGING — DX DG CHEST 1V PORT
1 series · 1 of 1 positions shown · non-contrast
Comparison: Radiograph 10/08/2020

CLINICAL DATA: Post open heart surgery, chest soreness

EXAM:
PORTABLE CHEST 1 VIEW

[chest]
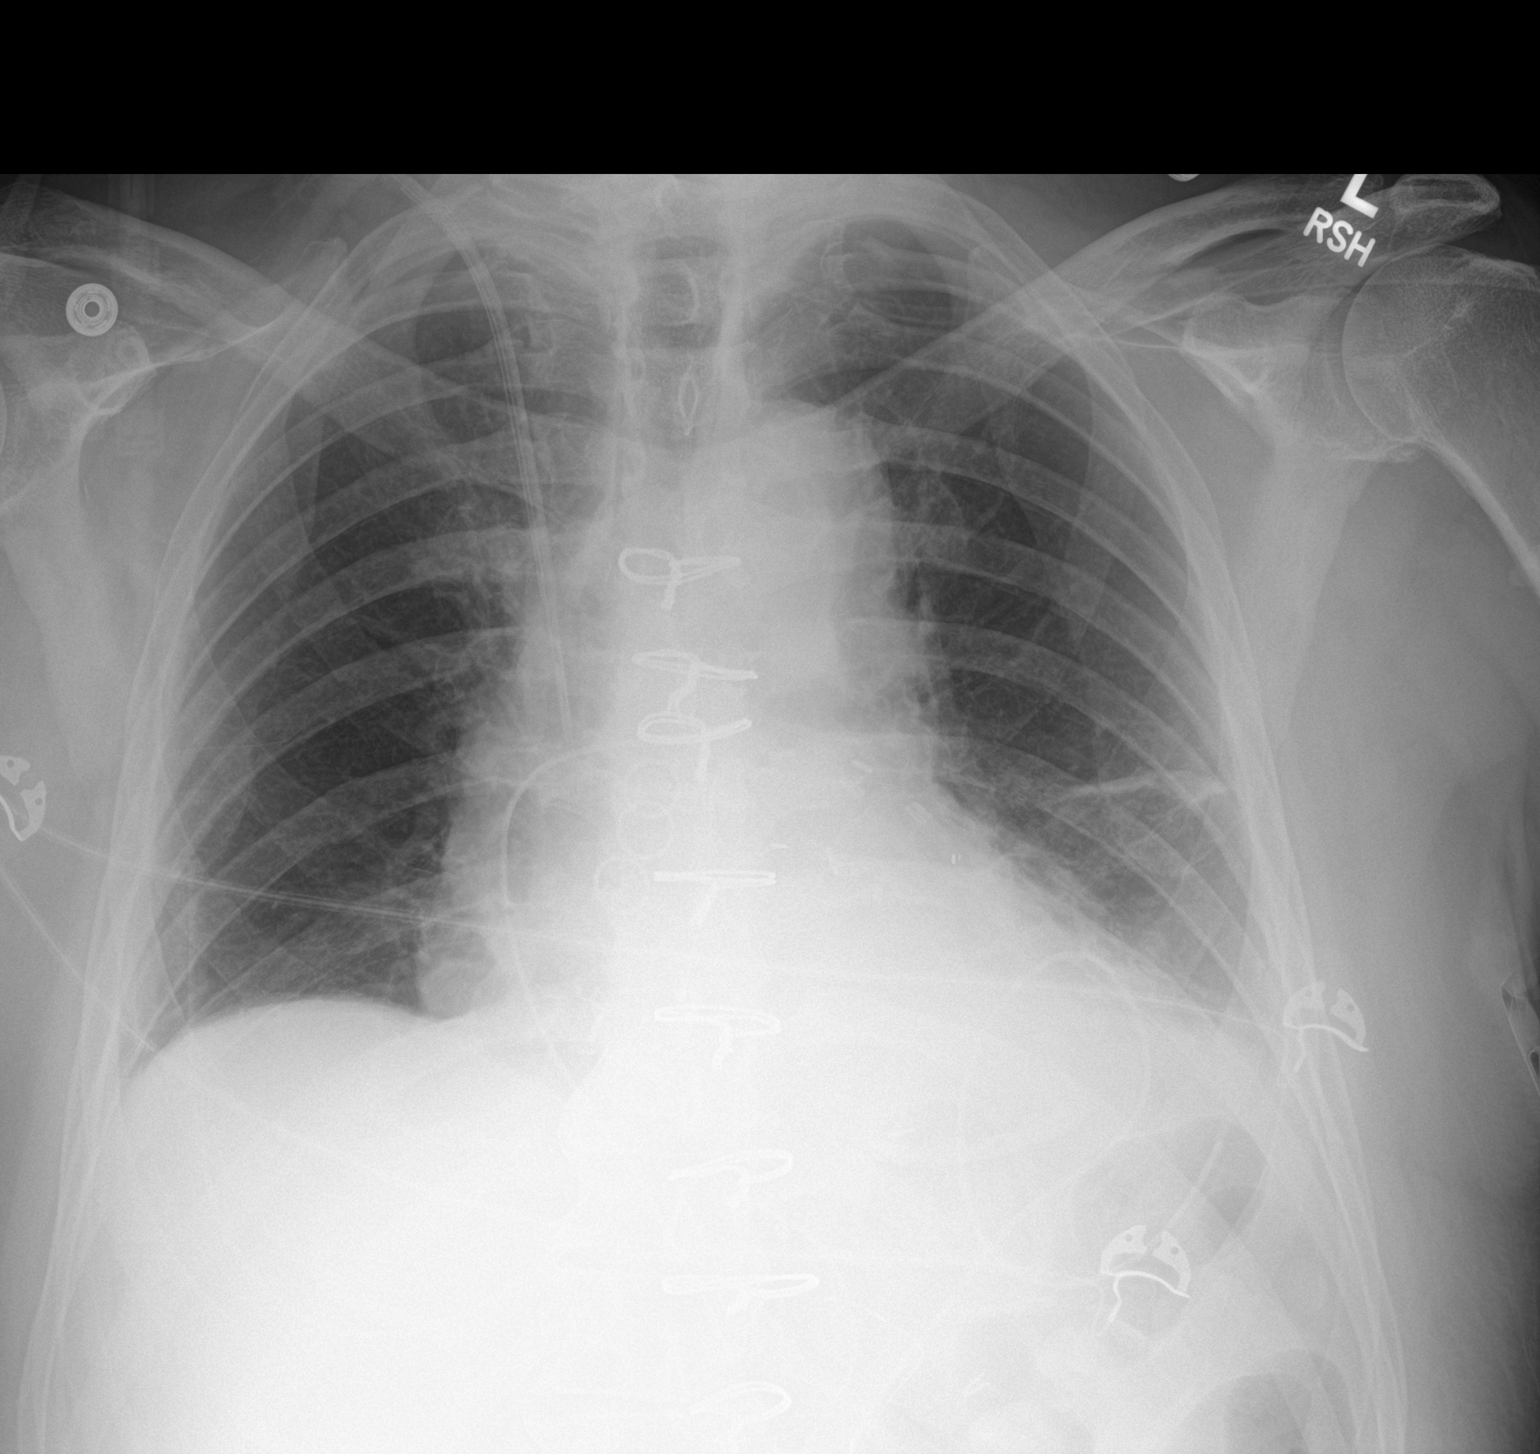

[1 of 1 positions shown; findings below may reference images not displayed]

FINDINGS: *Right IJ catheter terminates at the superior cavoatrial junction.
*Removal of the previously seen endotracheal tube.
*Mediastinal and left basilar pleural drains remain in place.
*Postsurgical changes from sternotomy and CABG.
*Telemetry leads and external support devices overlie the chest.

Some new bandlike opacity in left mid lung is likely
subsegmental/platelike atelectasis with additional hazy atelectatic
changes in the lung bases. Likely small layering left and trace
right pleural effusions. No pneumothorax is seen. Cardiomediastinal
contours are similar to prior counting for differences in technique.
No other acute osseous or soft tissue abnormalities.
IMPRESSION: 1. Interval extubation. Remaining lines and tubes as detailed above.
2. New bandlike opacity in the left mid lung, likely
subsegmental/platelike atelectasis with additional hazy atelectatic
changes in the lung bases.
3. Some layering left pleural effusion with trace right effusion as
well.
4. Stable cardiomediastinal contours.

## 2022-09-28 IMAGING — DX DG CHEST 2V
2 series · 2 of 2 positions shown · non-contrast
Comparison: October 10, 2020

CLINICAL DATA: Pleural effusion

EXAM:
CHEST - 2 VIEW

[chest pa]
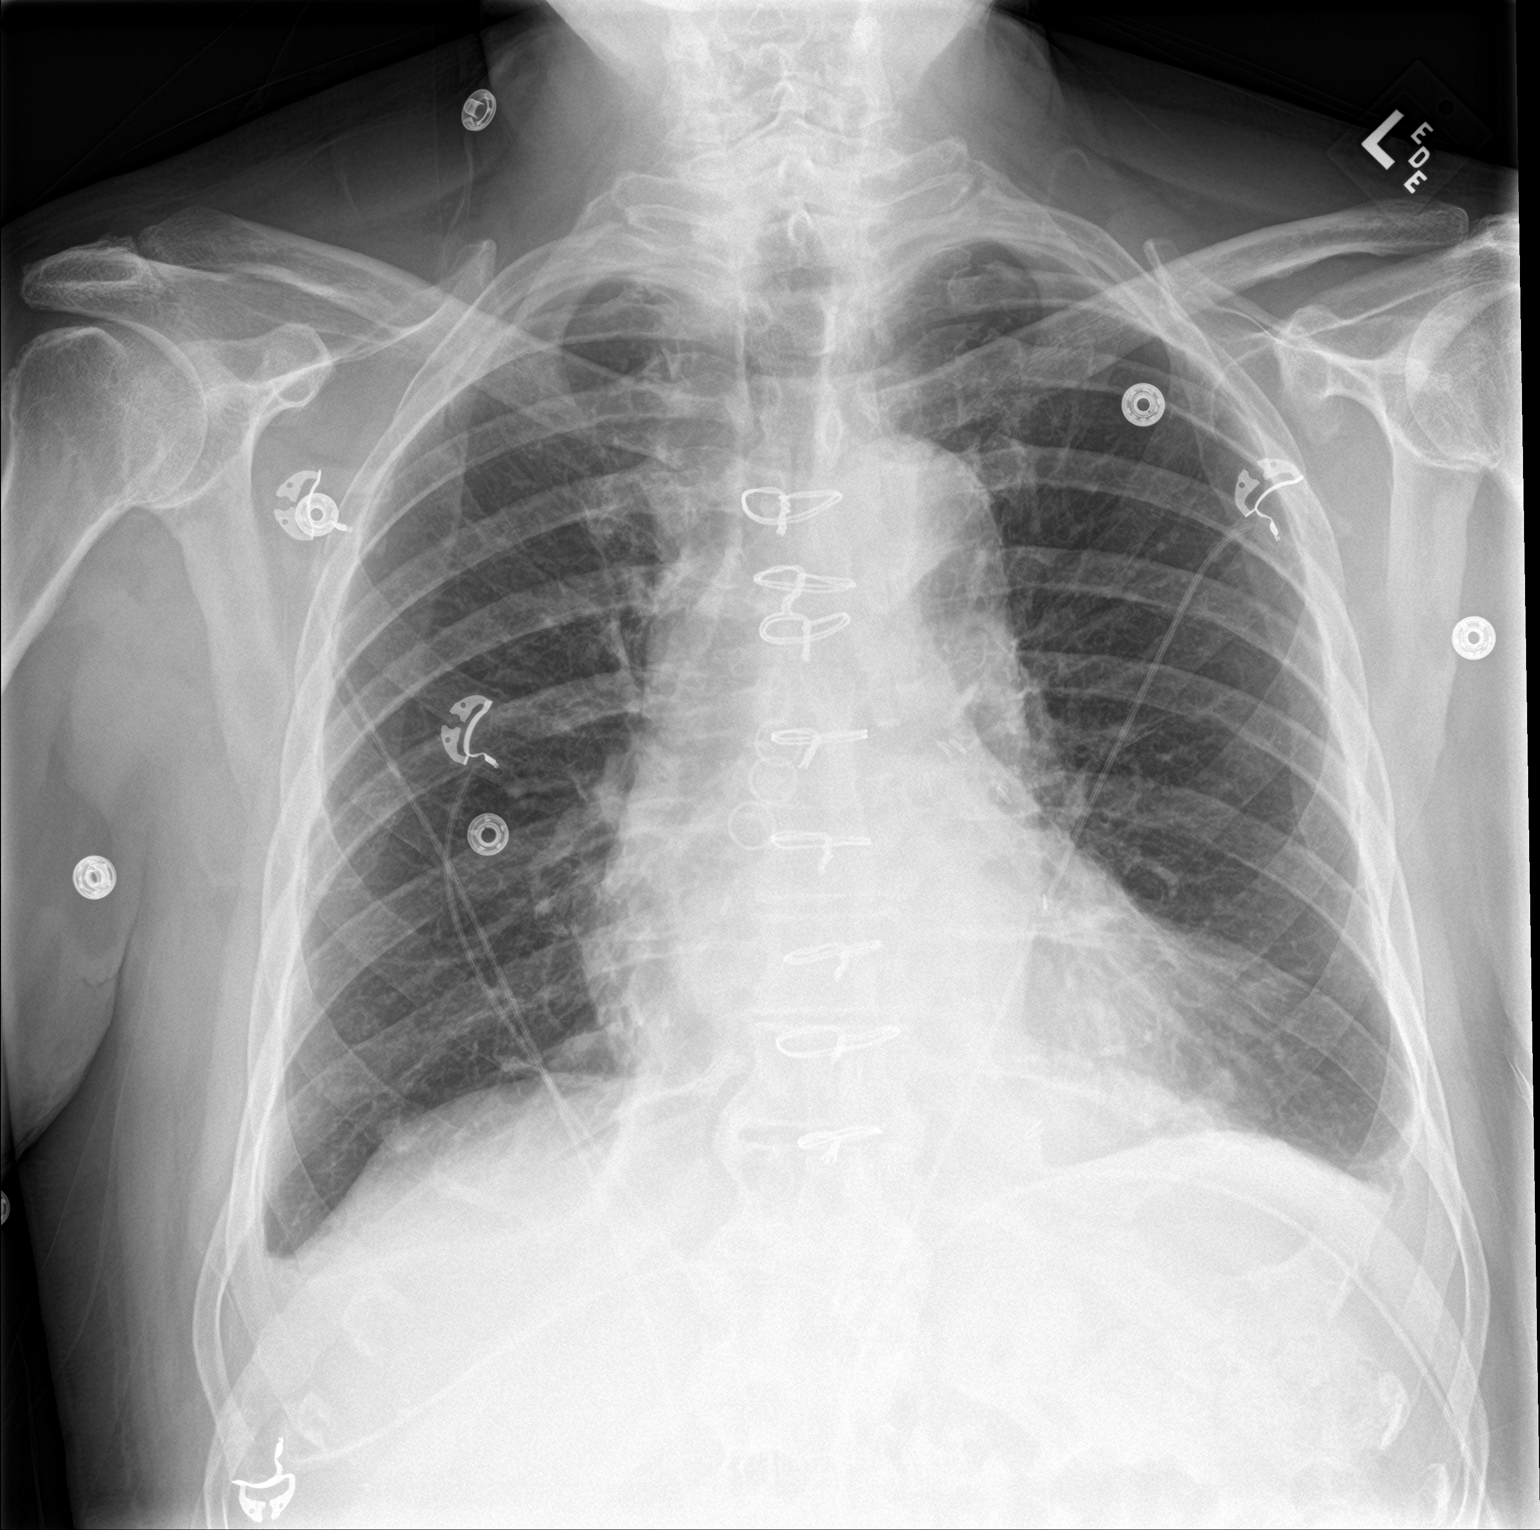

[chest lat]
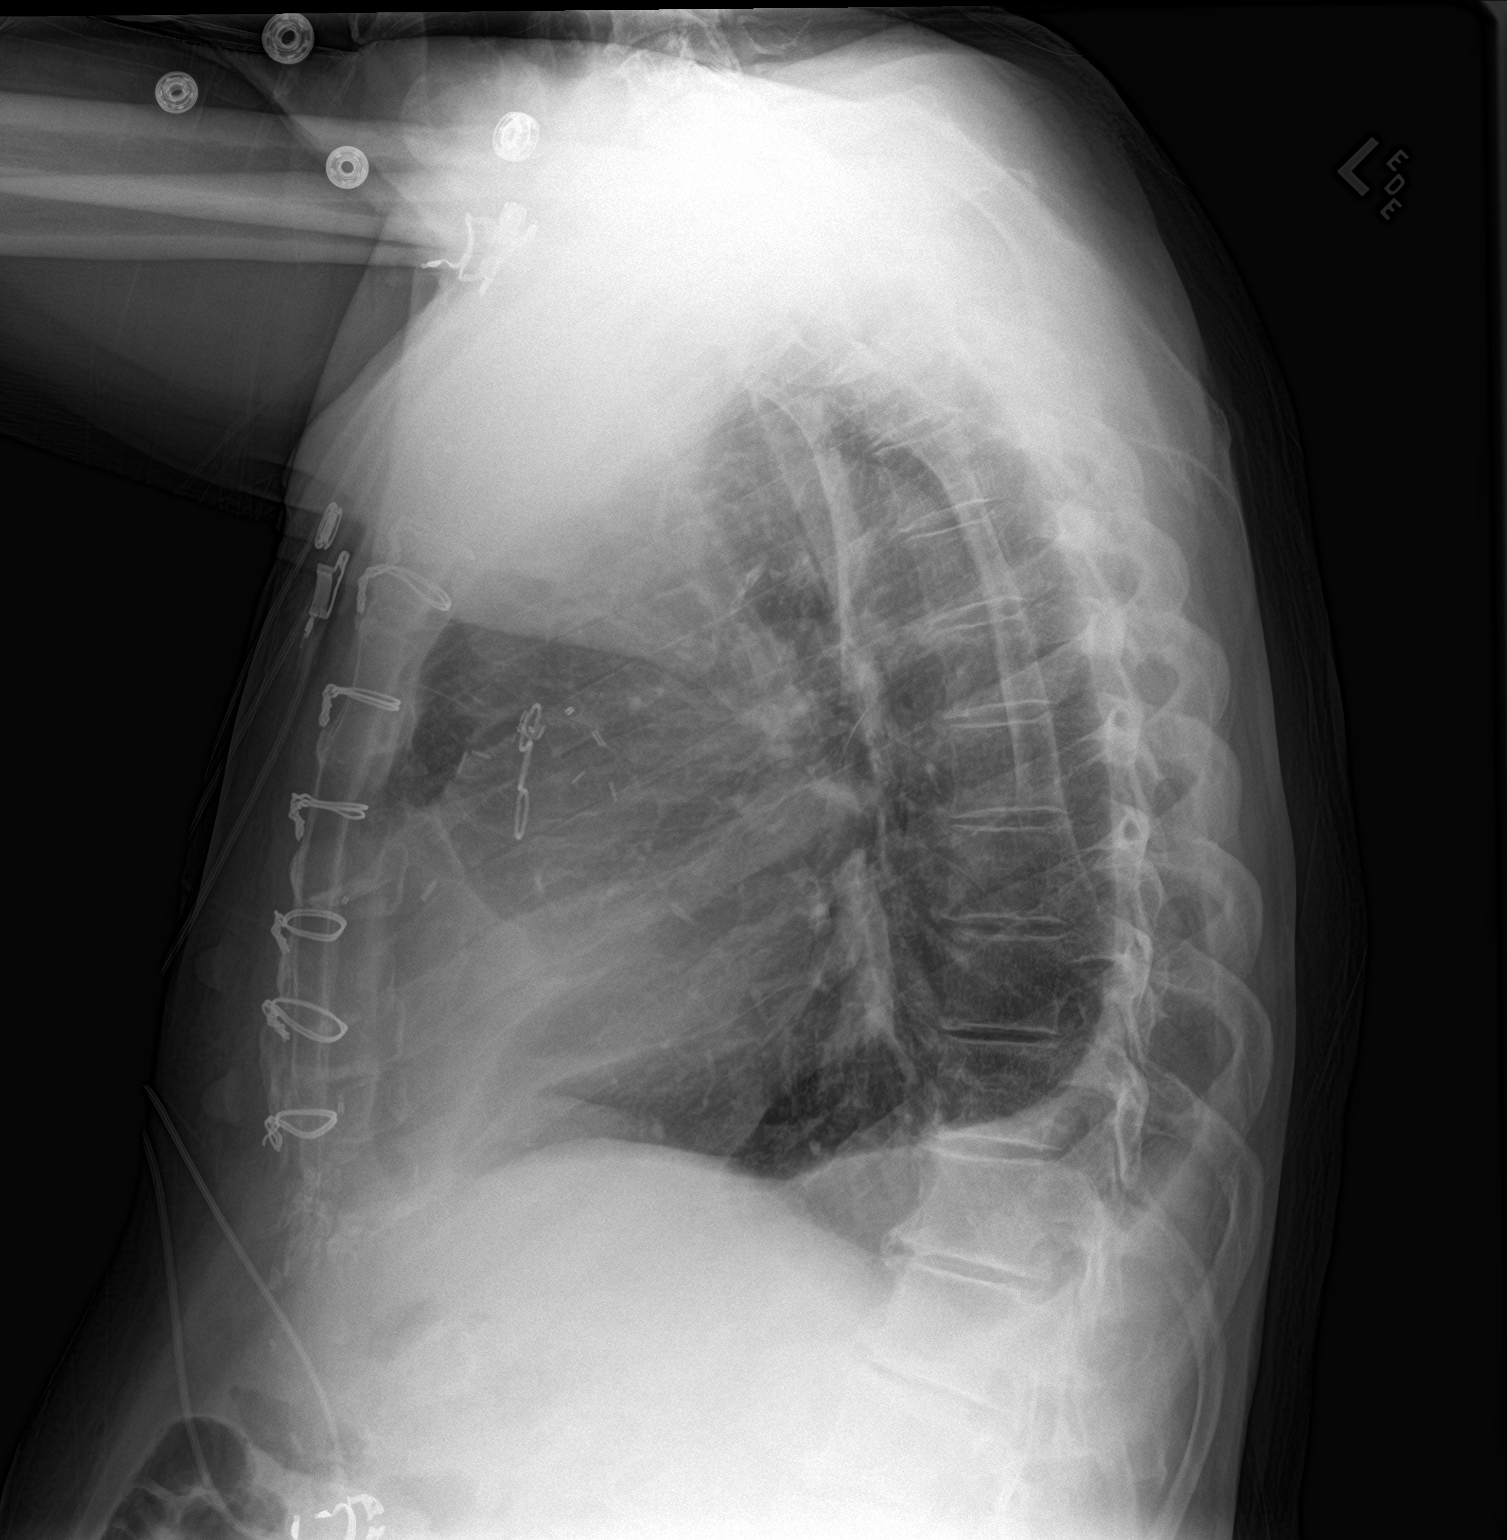

[2 of 2 positions shown; findings below may reference images not displayed]

FINDINGS: There are small pleural effusions bilaterally. There is no edema or
airspace opacity. Heart is upper normal in size with pulmonary
vascularity normal. Patient is status post coronary artery bypass
grafting. There is aortic atherosclerosis. No adenopathy. No bone
lesions. No pneumothorax.
IMPRESSION: Small pleural effusion on each side. No edema or airspace opacity.
Heart upper normal in size. Status post coronary artery bypass
grafting.

Aortic Atherosclerosis ([id number]).

## 2023-01-19 DIAGNOSIS — I739 Peripheral vascular disease, unspecified: Secondary | ICD-10-CM | POA: Insufficient documentation

## 2023-01-19 NOTE — Progress Notes (Signed)
MRN : [id number]  Andrew Richardson is a 82 y.o. (03/10/1941) male who presents with chief complaint of check circulation.  History of Present Illness:   The patient is seen for evaluation of weak and tired lower extremities. Patient notes the problem is always associated with activity.  The pain is somewhat consistent day to day occurring on most days. The patient notes the discomfort does not occur with standing or laying down. The discomfort has been progressive over the past several years.  He states as he continues to walk his legs become so tired heavy and weak that he must stop at which point if he rests for a few minutes he can walk again the patient states these symptoms are causing  a negative impact on quality of life and daily activities which was a factor in the referral.  The patient denies a  history of back problems and DJD of the lumbar and sacral spine.   The patient denies rest pain or dangling of an extremity off the side of the bed during the night for relief. No open wounds or sores at this time. No history of DVT or phlebitis. No prior vascular interventions or surgeries.  ABIs Rt=1.07 and Lt=1.00 (triphasic bilaterally) and with exercise there is no change or decrease at all.   No outpatient medications have been marked as taking for the 01/20/23 encounter (Appointment) with Gilda Crease, Latina Craver, MD.    Past Medical History:  Diagnosis Date   Colon polyps    Coronary artery disease    Depression    Headache    Hyperlipemia    Hypertension    Hypothyroid    Prediabetes    Transitional cell carcinoma of bladder (HCC)    Tubular adenoma of colon     Past Surgical History:  Procedure Laterality Date   ANGIOPLASTY     COLONOSCOPY     COLONOSCOPY WITH PROPOFOL N/A 05/20/2015   Procedure: COLONOSCOPY WITH PROPOFOL;  Surgeon: Christena Deem, MD;  Location: [facility] ENDOSCOPY;  Service: Endoscopy;  Laterality: N/A;   CORONARY ARTERY BYPASS GRAFT  N/A 10/08/2020   Procedure: CORONARY ARTERY BYPASS GRAFTING TIMES  FOUR  WITH LEFT INTERNAL MAMMARY ARTERY AND ENDOSCOPIC HARVEST OF BILATERAL  LEG SAPHENOUS VEIN;  Surgeon: Corliss Skains, MD;  Location: [facility] OR;  Service: Open Heart Surgery;  Laterality: N/A;   INGUINAL HERNIA REPAIR Left 10/31/2015   Procedure: HERNIA REPAIR INGUINAL ADULT;  Surgeon: Nadeen Landau, MD;  Location: [facility] ORS;  Service: General;  Laterality: Left;   LEFT HEART CATH N/A 10/06/2020   Procedure: Left Heart Cath and Coronary Angiography;  Surgeon: Marcina Millard, MD;  Location: [facility] INVASIVE CV LAB;  Service: Cardiovascular;  Laterality: N/A;   TEE WITHOUT CARDIOVERSION N/A 10/08/2020   Procedure: TRANSESOPHAGEAL ECHOCARDIOGRAM (TEE);  Surgeon: Corliss Skains, MD;  Location: [facility] OR;  Service: Open Heart Surgery;  Laterality: N/A;    Social History Social History   Tobacco Use   Smoking status: Former    Types: Cigarettes    Quit date: 05/19/1989    Years since quitting: 33.6   Smokeless tobacco: Former  Substance Use Topics   Alcohol use: Yes    Alcohol/week: 3.0 standard drinks of alcohol    Types: 3 Shots of liquor per week   Drug use: No    Family History Family History  Problem Relation Age of Onset  Rheumatic fever Mother     Allergies  Allergen Reactions   Simvastatin Other (See Comments)    Fatigue and muscle pain/weakness   Tetracyclines & Related Diarrhea   Nitrofurantoin Rash     REVIEW OF SYSTEMS (Negative unless checked)  Constitutional: [] Weight loss  [] Fever  [] Chills Cardiac: [] Chest pain   [] Chest pressure   [] Palpitations   [] Shortness of breath when laying flat   [] Shortness of breath with exertion. Vascular:  [x] Pain in legs with walking   [] Pain in legs at rest  [] History of DVT   [] Phlebitis   [] Swelling in legs   [] Varicose veins   [] Non-healing ulcers Pulmonary:   [] Uses home oxygen   [] Productive cough   [] Hemoptysis   [] Wheeze  [] COPD    [] Asthma Neurologic:  [] Dizziness   [] Seizures   [] History of stroke   [] History of TIA  [] Aphasia   [] Vissual changes   [] Weakness or numbness in arm   [] Weakness or numbness in leg Musculoskeletal:   [] Joint swelling   [] Joint pain   [] Low back pain Hematologic:  [] Easy bruising  [] Easy bleeding   [] Hypercoagulable state   [] Anemic Gastrointestinal:  [] Diarrhea   [] Vomiting  [] Gastroesophageal reflux/heartburn   [] Difficulty swallowing. Genitourinary:  [] Chronic kidney disease   [] Difficult urination  [] Frequent urination   [] Blood in urine Skin:  [] Rashes   [] Ulcers  Psychological:  [] History of anxiety   []  History of major depression.  Physical Examination  There were no vitals filed for this visit. There is no height or weight on file to calculate BMI. Gen: WD/WN, NAD Head: [place]/AT, No temporalis wasting.  Ear/Nose/Throat: Hearing grossly intact, nares w/o erythema or drainage Eyes: PER, EOMI, sclera nonicteric.  Neck: Supple, no masses.  No bruit or JVD.  Pulmonary:  Good air movement, no audible wheezing, no use of accessory muscles.  Cardiac: RRR, normal S1, S2, no Murmurs. Vascular:  mild trophic changes, no open wounds Vessel Right Left  Radial Palpable Palpable  PT Not Palpable Not Palpable  DP Trace Palpable Trace Palpable  Gastrointestinal: soft, non-distended. No guarding/no peritoneal signs.  Musculoskeletal: M/S 5/5 throughout.  No visible deformity.  Neurologic: CN 2-12 intact. Pain and light touch intact in extremities.  Symmetrical.  Speech is fluent. Motor exam as listed above. Psychiatric: Judgment intact, Mood & affect appropriate for pt's clinical situation. Dermatologic: No rashes or ulcers noted.  No changes consistent with cellulitis.   CBC Lab Results  Component Value Date   WBC 4.9 10/12/2020   HGB 8.7 (L) 10/12/2020   HCT 25.0 (L) 10/12/2020   MCV 101.6 (H) 10/12/2020   PLT 184 10/12/2020    BMET    Component Value Date/Time   NA 132 (L)  10/12/2020 0149   K 4.4 10/12/2020 0149   CL 97 (L) 10/12/2020 0149   CO2 26 10/12/2020 0149   GLUCOSE 112 (H) 10/12/2020 0149   BUN 21 10/12/2020 0149   CREATININE 0.85 10/12/2020 0149   CALCIUM 8.2 (L) 10/12/2020 0149   GFRNONAA >60 10/12/2020 0149   GFRAA >60 10/23/2015 0857   CrCl cannot be calculated (Patient's most recent lab result is older than the maximum 21 days allowed.).  COAG Lab Results  Component Value Date   INR 1.4 (H) 10/08/2020   INR 1.0 10/05/2020    Radiology No results found.   Assessment/Plan 1. PAD (peripheral artery disease) (HCC) Recommend:  The patient has atypical pain symptoms for vascular disease and on exam I do not find evidence of  vascular pathology that would explain the patient's symptoms.  Noninvasive studies do not identify significant vascular problems, even ABI's with exercise which reproduced his symptoms did not decrease.  I suspect the patient is c/o pseudoclaudication.  Patient should have an evaluation of the LS spine which I defer to the Spine service.  I have placed an ambulatory referral.  The patient should continue walking and begin a more formal exercise program. The patient should continue his antiplatelet therapy and aggressive treatment of the lipid abnormalities.  Patient will follow-up with me if his spine workup is negative.  I have discussed with the patient that in the event we do not find spinal pathology that would explain his symptoms the neck step would be a CT angiogram of the abdomen pelvis with bilateral runoff.  - Ambulatory referral to Neurosurgery  2. CAD S/P percutaneous coronary angioplasty Continue cardiac and antihypertensive medications as already ordered and reviewed, no changes at this time.  Continue statin as ordered and reviewed, no changes at this time  Nitrates PRN for chest pain  3. Primary hypertension Continue antihypertensive medications as already ordered, these medications have been  reviewed and there are no changes at this time.  4. Hyperlipidemia, unspecified hyperlipidemia type Continue statin as ordered and reviewed, no changes at this time    Levora Dredge, MD  01/19/2023 10:32 AM

## 2023-01-20 ENCOUNTER — Other Ambulatory Visit (INDEPENDENT_AMBULATORY_CARE_PROVIDER_SITE_OTHER): Payer: Medicare Other

## 2023-01-20 ENCOUNTER — Other Ambulatory Visit (INDEPENDENT_AMBULATORY_CARE_PROVIDER_SITE_OTHER): Payer: Self-pay | Admitting: Vascular Surgery

## 2023-01-20 ENCOUNTER — Ambulatory Visit (INDEPENDENT_AMBULATORY_CARE_PROVIDER_SITE_OTHER): Payer: Medicare Other | Admitting: Vascular Surgery

## 2023-01-20 VITALS — BP 125/74 | HR 61 | Resp 17 | Ht 67.0 in | Wt 195.2 lb

## 2023-01-20 DIAGNOSIS — Z9861 Coronary angioplasty status: Secondary | ICD-10-CM

## 2023-01-20 DIAGNOSIS — I10 Essential (primary) hypertension: Secondary | ICD-10-CM

## 2023-01-20 DIAGNOSIS — I739 Peripheral vascular disease, unspecified: Secondary | ICD-10-CM

## 2023-01-20 DIAGNOSIS — E785 Hyperlipidemia, unspecified: Secondary | ICD-10-CM

## 2023-01-20 DIAGNOSIS — I2510 Atherosclerotic heart disease of native coronary artery without angina pectoris: Secondary | ICD-10-CM

## 2023-01-21 ENCOUNTER — Encounter (INDEPENDENT_AMBULATORY_CARE_PROVIDER_SITE_OTHER): Payer: Self-pay | Admitting: Vascular Surgery

## 2023-01-21 ENCOUNTER — Telehealth (INDEPENDENT_AMBULATORY_CARE_PROVIDER_SITE_OTHER): Payer: Self-pay | Admitting: Vascular Surgery

## 2023-01-21 NOTE — Telephone Encounter (Signed)
Pt called in stating that he was supposed to receive a referral from Dr. Gilda Crease to " the back doctor". Please advise.

## 2023-01-21 NOTE — Telephone Encounter (Signed)
Will address with Dr Gilda Crease when he returns in the office on Monday

## 2023-01-24 ENCOUNTER — Encounter (INDEPENDENT_AMBULATORY_CARE_PROVIDER_SITE_OTHER): Payer: Medicare Other

## 2023-01-24 ENCOUNTER — Encounter (INDEPENDENT_AMBULATORY_CARE_PROVIDER_SITE_OTHER): Payer: Self-pay

## 2023-01-24 ENCOUNTER — Ambulatory Visit (INDEPENDENT_AMBULATORY_CARE_PROVIDER_SITE_OTHER): Payer: Medicare Other | Admitting: Vascular Surgery

## 2023-01-24 LAB — VAS US ABI WITH/WO TBI
Left ABI: 1
Right ABI: 1.07

## 2023-01-24 NOTE — Telephone Encounter (Signed)
Left a detailed message on patient voicemail that referral has been placed

## 2023-02-24 NOTE — Progress Notes (Signed)
Referring Physician:  Renford Dills, MD [address] [place],  [place] [address]  Primary Physician:  Kandyce Rud, MD  History of Present Illness: 02/28/2023 Mr. Carrell Gentilcore has a history of CAD, NSTEMI, HTN, hyperlipidemia, CABG x 4.   Seen by vascular for "weak and tired" legs and no vascular pathology was found. He was referred here for further evaluation.   He is a retired International aid/development worker.   He has no complaints of back or leg pain. 5-6 year history of his legs feeling weak and tired after any walking, even short distances. He can't even walk 100 feet without legs feeling this way. He does okay with standing. Grocery cart helps. He has some intermittent left lateral hip pain- no groin pain. No numbness or tingling. He has weakness in the legs.   Bowel/Bladder Dysfunction: none  Conservative measures:  Physical therapy: none for lumbar spine. Did cardiac rehab previously Multimodal medical therapy including regular antiinflammatories: none  Injections: has not received any epidural steroid injections  Past Surgery: denies  [name] has no symptoms of cervical myelopathy.  The symptoms are causing a significant impact on the patient's life.   Review of Systems:  A 10 point review of systems is negative, except for the pertinent positives and negatives detailed in the HPI.  Past Medical History: Past Medical History:  Diagnosis Date   Colon polyps    Coronary artery disease    Depression    Headache    Hyperlipemia    Hypertension    Hypothyroid    Prediabetes    Transitional cell carcinoma of bladder (HCC)    Tubular adenoma of colon     Past Surgical History: Past Surgical History:  Procedure Laterality Date   ANGIOPLASTY     COLONOSCOPY     COLONOSCOPY WITH PROPOFOL N/A 05/20/2015   Procedure: COLONOSCOPY WITH PROPOFOL;  Surgeon: Christena Deem, MD;  Location: [facility] ENDOSCOPY;  Service: Endoscopy;  Laterality: N/A;   CORONARY ARTERY  BYPASS GRAFT N/A 10/08/2020   Procedure: CORONARY ARTERY BYPASS GRAFTING TIMES  FOUR  WITH LEFT INTERNAL MAMMARY ARTERY AND ENDOSCOPIC HARVEST OF BILATERAL  LEG SAPHENOUS VEIN;  Surgeon: Corliss Skains, MD;  Location: [facility] OR;  Service: Open Heart Surgery;  Laterality: N/A;   INGUINAL HERNIA REPAIR Left 10/31/2015   Procedure: HERNIA REPAIR INGUINAL ADULT;  Surgeon: Nadeen Landau, MD;  Location: [facility] ORS;  Service: General;  Laterality: Left;   LEFT HEART CATH N/A 10/06/2020   Procedure: Left Heart Cath and Coronary Angiography;  Surgeon: Marcina Millard, MD;  Location: [facility] INVASIVE CV LAB;  Service: Cardiovascular;  Laterality: N/A;   TEE WITHOUT CARDIOVERSION N/A 10/08/2020   Procedure: TRANSESOPHAGEAL ECHOCARDIOGRAM (TEE);  Surgeon: Corliss Skains, MD;  Location: [facility] OR;  Service: Open Heart Surgery;  Laterality: N/A;    Allergies: Allergies as of 02/28/2023 - Review Complete 02/28/2023  Allergen Reaction Noted   Simvastatin Other (See Comments) 04/13/2018   Tetracyclines & related Diarrhea 05/19/2015   Nitrofurantoin Rash 10/23/2015    Medications: Outpatient Encounter Medications as of 02/28/2023  Medication Sig   amLODipine (NORVASC) 5 MG tablet Take 5 mg by mouth every evening.   aspirin EC 325 MG EC tablet Take 1 tablet (325 mg total) by mouth daily.   atorvastatin (LIPITOR) 10 MG tablet Take 10 mg by mouth every evening.   furosemide (LASIX) 20 MG tablet Take by mouth.   levothyroxine (SYNTHROID) 50 MCG tablet Take 50 mcg by mouth daily.  losartan (COZAAR) 100 MG tablet Take 100 mg by mouth every evening.   metoprolol tartrate (LOPRESSOR) 25 MG tablet Take 1 tablet (25 mg total) by mouth 2 (two) times daily.   tamsulosin (FLOMAX) 0.4 MG CAPS capsule Take 0.4 mg by mouth daily.   No facility-administered encounter medications on file as of 02/28/2023.    Social History: Social History   Tobacco Use   Smoking status: Former    Types: Cigarettes    Quit date:  05/19/1989    Years since quitting: 33.8   Smokeless tobacco: Former  Substance Use Topics   Alcohol use: Yes    Alcohol/week: 3.0 standard drinks of alcohol    Types: 3 Shots of liquor per week   Drug use: No    Family Medical History: Family History  Problem Relation Age of Onset   Rheumatic fever Mother     Physical Examination: Vitals:   02/28/23 1316  BP: 132/82    General: Patient is well developed, well nourished, calm, collected, and in no apparent distress. Attention to examination is appropriate.  Respiratory: Patient is breathing without any difficulty.   NEUROLOGICAL:     Awake, alert, oriented to person, place, and time.  Speech is clear and fluent. Fund of knowledge is appropriate.   Cranial Nerves: Pupils equal round and reactive to light.  Facial tone is symmetric.    No posterior lumbar tenderness.   No abnormal lesions on exposed skin.   Strength: Side Biceps Triceps Deltoid Interossei Grip Wrist Ext. Wrist Flex.  R 5 5 5 5 5 5 5   L 5 5 5 5 5 5 5    Side Iliopsoas Quads Hamstring PF DF EHL  R 5 5 5 5 5 5   L 5 5 5 5 5 5    Reflexes are 1+ and symmetric at the biceps, triceps, brachioradialis, patella and achilles.   Hoffman's is absent.  Clonus is not present.   Bilateral upper and lower extremity sensation is intact to light touch.     He walks hunched forward.   Medical Decision Making  Imaging: No lumbar imaging.   Assessment and Plan: Mr. Normil is a pleasant 82 y.o. male has no complaints of back or leg pain. 5-6 year history of his legs feeling weak and tired after any walking, even short distances. He can't even walk 100 feet without legs feeling this way. No numbness or tingling. He has weakness in the legs.   No lumbar imaging, but symptoms are suspicious for spinal stenosis.   Treatment options discussed with patient and following plan made:   - MRI of lumbar spine to evaluate for spinal stenosis.  - Depending on results of  MRI, may consider PT and/or injections.  - Will schedule follow up visit to review MRI results once I get them back.   I spent a total of 30 minutes in face-to-face and non-face-to-face activities related to this patient's care today including review of outside records, review of imaging, review of symptoms, physical exam, discussion of differential diagnosis, discussion of treatment options, and documentation.   Thank you for involving me in the care of this patient.   Drake Leach PA-C Dept. of Neurosurgery

## 2023-02-28 ENCOUNTER — Ambulatory Visit (INDEPENDENT_AMBULATORY_CARE_PROVIDER_SITE_OTHER): Payer: Medicare Other | Admitting: Orthopedic Surgery

## 2023-02-28 ENCOUNTER — Encounter: Payer: Self-pay | Admitting: Orthopedic Surgery

## 2023-02-28 VITALS — BP 132/82 | Ht 67.0 in | Wt 189.4 lb

## 2023-02-28 DIAGNOSIS — M5416 Radiculopathy, lumbar region: Secondary | ICD-10-CM | POA: Diagnosis not present

## 2023-02-28 NOTE — Patient Instructions (Signed)
It was so nice to see you today. Thank you so much for coming in.    The feelings of fatigue and weakness in your legs may be coming from your back. Spinal stenosis can cause symptoms like this.   I want to get an MRI of your lower back to look into things further. We will get this approved through your insurance and [facility] will call you to schedule the appointment.   Once I get the MRI results back, we will call you to schedule a follow up with me to review them.   Please do not hesitate to call if you have any questions or concerns. You can also message me in MyChart.   If you have not heard back about the MRI in the next week, please call the office so we can help you get it scheduled.   Drake Leach PA-C [phone]

## 2023-03-08 ENCOUNTER — Emergency Department: Payer: Medicare Other

## 2023-03-08 ENCOUNTER — Other Ambulatory Visit: Payer: Self-pay

## 2023-03-08 ENCOUNTER — Encounter: Payer: Self-pay | Admitting: Emergency Medicine

## 2023-03-08 ENCOUNTER — Emergency Department
Admission: EM | Admit: 2023-03-08 | Discharge: 2023-03-08 | Disposition: A | Discharge status: Home / Self Care | Payer: Medicare Other | Attending: Emergency Medicine | Admitting: Emergency Medicine

## 2023-03-08 DIAGNOSIS — Z7982 Long term (current) use of aspirin: Secondary | ICD-10-CM | POA: Diagnosis not present

## 2023-03-08 DIAGNOSIS — Z79899 Other long term (current) drug therapy: Secondary | ICD-10-CM | POA: Diagnosis not present

## 2023-03-08 DIAGNOSIS — Z951 Presence of aortocoronary bypass graft: Secondary | ICD-10-CM | POA: Insufficient documentation

## 2023-03-08 DIAGNOSIS — M7989 Other specified soft tissue disorders: Secondary | ICD-10-CM | POA: Diagnosis present

## 2023-03-08 DIAGNOSIS — L03116 Cellulitis of left lower limb: Secondary | ICD-10-CM | POA: Insufficient documentation

## 2023-03-08 LAB — BASIC METABOLIC PANEL
Anion gap: 11 (ref 5–15)
BUN: 21 mg/dL (ref 8–23)
CO2: 25 mmol/L (ref 22–32)
Calcium: 8.7 mg/dL — ABNORMAL LOW (ref 8.9–10.3)
Chloride: 96 mmol/L — ABNORMAL LOW (ref 98–111)
Creatinine, Ser: 0.98 mg/dL (ref 0.61–1.24)
GFR, Estimated: >60 mL/min (ref >60)
Glucose, Bld: 122 mg/dL — ABNORMAL HIGH (ref 70–99)
Potassium: 3.9 mmol/L (ref 3.5–5.1)
Sodium: 132 mmol/L — ABNORMAL LOW (ref 135–145)

## 2023-03-08 LAB — CBC
HCT: 39.1 % (ref 39.0–52.0)
Hemoglobin: 12.9 g/dL — ABNORMAL LOW (ref 13.0–17.0)
MCH: 33.2 pg (ref 26.0–34.0)
MCHC: 33 g/dL (ref 30.0–36.0)
MCV: 100.5 fL — ABNORMAL HIGH (ref 80.0–100.0)
Platelets: 166 10*3/uL (ref 150–400)
RBC: 3.89 MIL/uL — ABNORMAL LOW (ref 4.22–5.81)
RDW: 13.4 % (ref 11.5–15.5)
WBC: 9.3 10*3/uL (ref 4.0–10.5)
nRBC: 0 % (ref 0.0–0.2)

## 2023-03-08 MED ORDER — CEPHALEXIN 500 MG PO CAPS: 500.0000 mg | ORAL_CAPSULE | Freq: Four times a day (QID) | ORAL | 0 refills | Status: AC

## 2023-03-08 MED ORDER — CEPHALEXIN 500 MG PO CAPS
500.0000 mg | ORAL_CAPSULE | Freq: Once | ORAL | Status: AC
  Administered 2023-03-08: 500 mg via ORAL
  Filled 2023-03-08: qty 1

## 2023-03-08 MED ORDER — IOHEXOL 350 MG/ML SOLN
125.0000 mL | Freq: Once | INTRAVENOUS | Status: AC | PRN
  Administered 2023-03-08: 125 mL via INTRAVENOUS

## 2023-03-08 NOTE — ED Notes (Signed)
Pt has notable left leg swelling. Pulses 2+, Cap refill <3

## 2023-03-08 NOTE — Discharge Instructions (Signed)
Please take antibiotics as prescribed and continue to monitor redness along the left thigh.  If redness increases or if you develop any fevers return to the ER.

## 2023-03-08 NOTE — ED Triage Notes (Addendum)
Left leg swelling and tenderness to left inner thigh x 3 days.  Red, warm, swelling.  Sent from [facility] for ED evaluation.  Patient with CABG in January 2021  and vein harvest from left leg.  Patient is AAOx3,  skin warm and dry. NAD

## 2023-03-08 NOTE — ED Notes (Signed)
PA, Gaines at bedside; update provided to patient at this time.

## 2023-03-08 NOTE — ED Notes (Signed)
Patient ambulatory to restroom independently

## 2023-03-08 NOTE — ED Provider Notes (Cosign Needed Addendum)
[place] EMERGENCY DEPARTMENT AT [facility] Provider Note   CSN: [id number] Arrival date & time: 03/08/23  1501     History  Chief Complaint  Patient presents with   Leg Problem    Andrew Richardson is a 82 y.o. male with history of coronary artery bypass graft performed in 2022 presents to the emergency department for evaluation of left distal femur/inner thigh swelling which is the area of his CABG graft site.  Patient states few days ago he woke up and noticed swelling in the area.  He has had no changes of the last few days with tenderness to palpation, swelling, slight warmth and redness.  He denies any numbness or tingling.  His pain has been mild.  No new weakness in the lower leg.  He denies any other symptoms such as chest pain or shortness of breath.  Patient states he is always had a little bit of soft tissue swelling along the area of the graft site only a couple centimeters but now area is much more swollen, 4-5 times larger than normal. HPI     Home Medications Prior to Admission medications   Medication Sig Start Date End Date Taking? Authorizing Provider  cephALEXin (KEFLEX) 500 MG capsule Take 1 capsule (500 mg total) by mouth 4 (four) times daily for 7 days. 03/08/23 03/15/23 Yes Evon Slack, PA-C  amLODipine (NORVASC) 5 MG tablet Take 5 mg by mouth every evening. 09/24/20   [provider]  aspirin EC 325 MG EC tablet Take 1 tablet (325 mg total) by mouth daily. 10/14/20   Ardelle Balls, PA-C  atorvastatin (LIPITOR) 10 MG tablet Take 10 mg by mouth every evening.    [provider]  furosemide (LASIX) 20 MG tablet Take by mouth. 10/29/22 10/29/23  [provider]  levothyroxine (SYNTHROID) 50 MCG tablet Take 50 mcg by mouth daily. 09/03/20   [provider]  losartan (COZAAR) 100 MG tablet Take 100 mg by mouth every evening.    [provider]  metoprolol tartrate (LOPRESSOR) 25 MG tablet Take 1 tablet (25  mg total) by mouth 2 (two) times daily. 10/15/20   Ardelle Balls, PA-C  tamsulosin (FLOMAX) 0.4 MG CAPS capsule Take 0.4 mg by mouth daily.    [provider]      Allergies    Simvastatin, Tetracyclines & related, and Nitrofurantoin    Review of Systems   Review of Systems  Physical Exam Updated Vital Signs BP 125/63 (BP Location: Left Arm)   Pulse 73   Temp 98.6 F (37 C) (Oral)   Resp 16   Ht 5\' 7"  (1.702 m)   Wt 85.9 kg   SpO2 95%   BMI 29.66 kg/m  Physical Exam Constitutional:      Appearance: He is well-developed.  HENT:     Head: Normocephalic and atraumatic.  Eyes:     Conjunctiva/sclera: Conjunctivae normal.  Cardiovascular:     Rate and Rhythm: Normal rate.  Pulmonary:     Effort: Pulmonary effort is normal. No respiratory distress.  Abdominal:     General: Abdomen is flat. There is no distension.     Tenderness: There is no abdominal tenderness. There is no guarding.  Musculoskeletal:        General: Normal range of motion.     Cervical back: Normal range of motion.  Skin:    General: Skin is warm and dry.     Findings: No rash.  Comments: Left lower extremity with 6 to 8 cm diameter area of soft tissue swelling along the left distal inner thigh.  Previous incision site from the graft is intact.  There is no drainage.  Does have some surrounding warmth and focal erythema to the area of swelling.  No sign of cellulitis tracking.  Patient has faint DP pulses heard on Doppler on the left leg that are slightly weaker than right.  There is also posterior tibialis pulses that are palpable bilaterally.  No wounds or skin breakdown noted.  Both Showed no signs of swelling warmth erythema edema or tenderness.  Neurological:     General: No focal deficit present.     Mental Status: He is alert and oriented to person, place, and time.  Psychiatric:        Behavior: Behavior normal.        Thought Content: Thought content normal.     ED Results /  Procedures / Treatments   Labs (all labs ordered are listed, but only abnormal results are displayed) Labs Reviewed  CBC - Abnormal; Notable for the following components:      Result Value   RBC 3.89 (*)    Hemoglobin 12.9 (*)    MCV 100.5 (*)    All other components within normal limits  BASIC METABOLIC PANEL - Abnormal; Notable for the following components:   Sodium 132 (*)    Chloride 96 (*)    Glucose, Bld 122 (*)    Calcium 8.7 (*)    All other components within normal limits    EKG None  Radiology CT ANGIO AO+BIFEM W & OR WO CONTRAST  Result Date: 03/08/2023 CLINICAL DATA:  Claudication or leg ischemia with swelling. Previous graft site pain. Left leg swelling and tenderness to the left inner thigh for 3 days. Redness, warmth, and swelling. EXAM: CT ANGIOGRAPHY OF ABDOMINAL AORTA WITH ILIOFEMORAL RUNOFF TECHNIQUE: Multidetector CT imaging of the abdomen, pelvis and lower extremities was performed using the standard protocol during bolus administration of intravenous contrast. Multiplanar CT image reconstructions and MIPs were obtained to evaluate the vascular anatomy. RADIATION DOSE REDUCTION: This exam was performed according to the departmental dose-optimization program which includes automated exposure control, adjustment of the mA and/or kV according to patient size and/or use of iterative reconstruction technique. CONTRAST:  OMNIPAQUE IOHEXOL 350 MG/ML SOLN COMPARISON:  CT abdomen and pelvis 03/10/2011 FINDINGS: VASCULAR Aorta: Normal caliber aorta without aneurysm, dissection, vasculitis or significant stenosis. Diffuse calcification of the aorta. Celiac: Patent without evidence of aneurysm, dissection, vasculitis or significant stenosis. SMA: Patent without evidence of aneurysm, dissection, vasculitis or significant stenosis. Renals: Both renal arteries are patent without evidence of aneurysm, dissection, vasculitis, fibromuscular dysplasia or significant stenosis. IMA:  Patent without evidence of aneurysm, dissection, vasculitis or significant stenosis. RIGHT Lower Extremity Inflow: Common, internal and external iliac arteries are patent without evidence of aneurysm, dissection, vasculitis or significant stenosis. Outflow: Common, superficial and profunda femoral arteries and the popliteal artery are patent without evidence of aneurysm, dissection, vasculitis or significant stenosis. Runoff: Patent three vessel runoff to the ankle. LEFT Lower Extremity Inflow: Common, internal and external iliac arteries are patent without evidence of aneurysm, dissection, vasculitis or significant stenosis. Outflow: Common, superficial and profunda femoral arteries and the popliteal artery are patent without evidence of aneurysm, dissection, vasculitis or significant stenosis. Runoff: Patent three vessel runoff to the ankle. Veins: No obvious venous abnormality within the limitations of this arterial phase study. Review of the MIP images  confirms the above findings. NON-VASCULAR Lower chest: Lung bases are clear. Postoperative changes in the mediastinum. Hepatobiliary: No focal liver abnormality is seen. No gallstones, gallbladder wall thickening, or biliary dilatation. Pancreas: Unremarkable. No pancreatic ductal dilatation or surrounding inflammatory changes. Spleen: Normal in size without focal abnormality. Adrenals/Urinary Tract: No adrenal gland nodules. Kidneys are symmetrical with symmetrical and homogeneous nephrograms. Soft tissue infiltration in the right pararenal fat measuring 2 x 6.5 cm. This appears to be separated from the kidney and not a renal mass. There is no change since the previous study of 2012. Long-term stability is indicative of a benign process. No hydronephrosis or hydroureter. Bladder is normal. Stomach/Bowel: Stomach, small bowel, and colon are not abnormally distended. No wall thickening or inflammatory changes. Appendix is not identified. Lymphatic: Scattered  retroperitoneal lymph nodes without pathologic enlargement, likely reactive. Reproductive: Prostate gland is enlarged. Other: No free air or free fluid in the abdomen. Abdominal wall musculature appears intact. Musculoskeletal: There is a circumscribed loculated collection with thick enhancing wall in the soft tissues medial to the distal left thigh just above the level of the knee. The collection measures about 4.2 cm in diameter. There is surrounding soft tissue stranding and edema. Changes are consistent with cellulitis and abscess. Degenerative changes in the spine. No acute bony abnormalities. IMPRESSION: VASCULAR 1. Diffuse atherosclerotic vascular calcifications. 2. No large vessel stenosis or occlusion. 3. Three-vessel runoff to both ankles. NON-VASCULAR 1. Cellulitis in the soft tissues medial to the left thigh with central abscess measuring 4.2 cm diameter. 2. Nonspecific soft tissue changes in the right pararenal fat are unchanged since 2012 suggesting benign etiology. No imaging follow-up is indicated. 3. Prostate gland is enlarged. Electronically Signed   By: Burman Nieves M.D.   On: 03/08/2023 20:30    Procedures Procedures    Medications Ordered in ED Medications  cephALEXin (KEFLEX) capsule 500 mg (has no administration in time range)  iohexol (OMNIPAQUE) 350 MG/ML injection 125 mL (125 mLs Intravenous Contrast Given 03/08/23 1917)    ED Course/ Medical Decision Making/ A&P                             Medical Decision Making Amount and/or Complexity of Data Reviewed Labs: ordered. Radiology: ordered.  Risk Prescription drug management.   82 year old male with swollen area to the left distal inner thigh from previous graft site from CABG.  3 days of erythema.  Patient afebrile.  Patient's had some pain and soreness along the area.  Patient blood work appears normal, he is afebrile.  Concern for about possible vascular issue but CT angio of lower extremity shows patent  vasculature.  The soft tissue mass along the left inner thigh is consistent with history of previous lipoma and acute cellulitis present.  Patient started on cephalexin.  He is given prescription for cephalexin for 1 week.  He is given strict return precautions and will return for any fevers increasing pain swelling warmth or redness.  Recommend follow-up with PCP and 1 week for recheck Final Clinical Impression(s) / ED Diagnoses Final diagnoses:  Cellulitis of left leg    Rx / DC Orders ED Discharge Orders          Ordered    CT ANGIO LOWER EXT BILAT W &/OR WO CONTRAST  Status:  Canceled        03/08/23 1759    cephALEXin (KEFLEX) 500 MG capsule  4 times daily  03/08/23 2059              Evon Slack, PA-C 03/08/23 2101    Jene Every, MD 03/08/23 2302    Evon Slack, PA-C 03/08/23 2304    Jene Every, MD 03/10/23 1310

## 2023-03-10 ENCOUNTER — Ambulatory Visit
Admission: RE | Admit: 2023-03-10 | Discharge: 2023-03-10 | Disposition: A | Discharge status: Home / Self Care | Payer: Medicare Other | Source: Ambulatory Visit | Attending: Orthopedic Surgery | Admitting: Orthopedic Surgery

## 2023-03-10 DIAGNOSIS — M5416 Radiculopathy, lumbar region: Secondary | ICD-10-CM | POA: Diagnosis present

## 2023-03-21 NOTE — Progress Notes (Addendum)
Referring Physician:  No referring provider defined for this encounter.  Primary Physician:  Kandyce Rud, MD  History of Present Illness: 03/22/2023 Andrew Richardson has a history of CAD, NSTEMI, HTN, hyperlipidemia, CABG x 4.   Seen in ED on 03/08/23 for cellulitis of left thigh- followed up with PCP on 03/14/23 and had in office I&D done of abscess left thigh. Given 10 day course of doxycycline.   He is a retired International aid/development worker.   Last seen by me on 02/28/23 with no back or leg pain, but history of legs feeling weak and tired after any walking, even short distances.   He is here to review his lumbar MRI.   No change in his symptoms.   He has no complaints of back or leg pain. He notes that his legs feel weak and tired after any walking, even short distances. He can't even walk 100 feet without legs feeling this way. Grocery cart helps. No numbness or tingling.   He is seeing general surgeon at [facility] for his left thigh today.   Conservative measures:  Physical therapy: none for lumbar spine. Did cardiac rehab previously Multimodal medical therapy including regular antiinflammatories: none  Injections: has not received any epidural steroid injections  Past Surgery: denies  Andrew Richardson has no symptoms of cervical myelopathy.  The symptoms are causing a significant impact on the patient's life.   Review of Systems:  A 10 point review of systems is negative, except for the pertinent positives and negatives detailed in the HPI.  Past Medical History: Past Medical History:  Diagnosis Date   Colon polyps    Coronary artery disease    Depression    Headache    Hyperlipemia    Hypertension    Hypothyroid    Prediabetes    Transitional cell carcinoma of bladder (HCC)    Tubular adenoma of colon     Past Surgical History: Past Surgical History:  Procedure Laterality Date   ANGIOPLASTY     COLONOSCOPY     COLONOSCOPY WITH PROPOFOL N/A 05/20/2015   Procedure:  COLONOSCOPY WITH PROPOFOL;  Surgeon: Christena Deem, MD;  Location: [facility] ENDOSCOPY;  Service: Endoscopy;  Laterality: N/A;   CORONARY ARTERY BYPASS GRAFT N/A 10/08/2020   Procedure: CORONARY ARTERY BYPASS GRAFTING TIMES  FOUR  WITH LEFT INTERNAL MAMMARY ARTERY AND ENDOSCOPIC HARVEST OF BILATERAL  LEG SAPHENOUS VEIN;  Surgeon: Corliss Skains, MD;  Location: [facility] OR;  Service: Open Heart Surgery;  Laterality: N/A;   INGUINAL HERNIA REPAIR Left 10/31/2015   Procedure: HERNIA REPAIR INGUINAL ADULT;  Surgeon: Nadeen Landau, MD;  Location: [facility] ORS;  Service: General;  Laterality: Left;   LEFT HEART CATH N/A 10/06/2020   Procedure: Left Heart Cath and Coronary Angiography;  Surgeon: Marcina Millard, MD;  Location: [facility] INVASIVE CV LAB;  Service: Cardiovascular;  Laterality: N/A;   TEE WITHOUT CARDIOVERSION N/A 10/08/2020   Procedure: TRANSESOPHAGEAL ECHOCARDIOGRAM (TEE);  Surgeon: Corliss Skains, MD;  Location: [facility] OR;  Service: Open Heart Surgery;  Laterality: N/A;    Allergies: Allergies as of 03/22/2023 - Review Complete 03/08/2023  Allergen Reaction Noted   Simvastatin Other (See Comments) 04/13/2018   Tetracyclines & related Diarrhea 05/19/2015   Nitrofurantoin Rash 10/23/2015    Medications: Outpatient Encounter Medications as of 03/22/2023  Medication Sig   amLODipine (NORVASC) 5 MG tablet Take 5 mg by mouth every evening.   aspirin EC 325 MG EC tablet Take 1 tablet (325 mg total) by  mouth daily.   atorvastatin (LIPITOR) 10 MG tablet Take 10 mg by mouth every evening.   furosemide (LASIX) 20 MG tablet Take by mouth.   levothyroxine (SYNTHROID) 50 MCG tablet Take 50 mcg by mouth daily.   losartan (COZAAR) 100 MG tablet Take 100 mg by mouth every evening.   metoprolol tartrate (LOPRESSOR) 25 MG tablet Take 1 tablet (25 mg total) by mouth 2 (two) times daily.   tamsulosin (FLOMAX) 0.4 MG CAPS capsule Take 0.4 mg by mouth daily.   No facility-administered encounter  medications on file as of 03/22/2023.    Social History: Social History   Tobacco Use   Smoking status: Former    Types: Cigarettes    Quit date: 05/19/1989    Years since quitting: 33.8   Smokeless tobacco: Former  Substance Use Topics   Alcohol use: Yes    Alcohol/week: 3.0 standard drinks of alcohol    Types: 3 Shots of liquor per week   Drug use: No    Family Medical History: Family History  Problem Relation Age of Onset   Rheumatic fever Mother     Physical Examination: There were no vitals filed for this visit.    Awake, alert, oriented to person, place, and time.  Speech is clear and fluent. Fund of knowledge is appropriate.   Cranial Nerves: Pupils equal round and reactive to light.  Facial tone is symmetric.    No abnormal lesions on exposed skin.   Strength:  Side Iliopsoas Quads Hamstring PF DF EHL  R 5 5 5 5 5 5   L 5 5 5 5 5 5    Small area of erythema distal left medial thigh. No drainage noted. No tenderness noted, but there is a firm circular area.   Bilateral lower extremity sensation is intact to light touch.     He walks hunched forward.   Medical Decision Making  Imaging: MRI of lumbar spine dated 03/10/23:  FINDINGS: Segmentation:  Standard.   Alignment:  2 mm retrolisthesis of T12 on L1 and L1 on L2.   Vertebrae: No acute fracture, evidence of discitis, or aggressive bone lesion.   Conus medullaris and cauda equina: Conus extends to the L1-2 level. Conus and cauda equina appear normal.   Paraspinal and other soft tissues: No acute paraspinal abnormality.   Disc levels:   Disc spaces: Degenerative disease with disc height loss at T11-12, T12-L1, L1-2, L3-4, L4-5 and L5-S1.   T11-12: Mild broad-based disc bulge. No foraminal or central canal stenosis.   T12-L1: Mild broad-based disc bulge. Moderate bilateral facet arthropathy. No foraminal or central canal stenosis.   L1-L2: Mild broad-based disc bulge. Mild bilateral  facet arthropathy. No spinal stenosis. Mild left foraminal stenosis. No right foraminal stenosis. Left lateral recess stenosis.   L2-L3: Broad-based disc bulge. Mild bilateral facet arthropathy. Mild spinal stenosis. Bilateral lateral recess stenosis. Mild left foraminal stenosis. No right foraminal stenosis.   L3-L4: Broad-based disc bulge. Mild bilateral facet arthropathy. Mild spinal stenosis. Mild-moderate bilateral foraminal stenosis.   L4-L5: Mild broad-based disc bulge. Moderate right and mild left facet arthropathy. Mild right foraminal stenosis. No left foraminal stenosis. No spinal stenosis.   L5-S1: Broad-based disc bulge with a left paracentral disc extrusion and mass effect on the left intraspinal S1 nerve root. Right subarticular recess stenosis. Moderate spinal stenosis. Moderate-severe bilateral foraminal stenosis.   IMPRESSION: 1. At L5-S1 there is a broad-based disc bulge with a left paracentral disc extrusion and mass effect on the left intraspinal  S1 nerve root. Right subarticular recess stenosis. Moderate spinal stenosis. Moderate-severe bilateral foraminal stenosis. 2. Diffuse lumbar spine spondylosis as described above. 3. No acute osseous injury of the lumbar spine.     Electronically Signed   By: Elige Ko M.D.   On: 03/19/2023 06:40  I have personally reviewed the images and agree with the above interpretation.   Assessment and Plan: Andrew Richardson is a pleasant 82 y.o. male has no complaints of back or leg pain. 8+ year history of his legs feeling weak and tired after any walking, even short distances. He can't even walk 100 feet without legs feeling this way. No numbness or tingling.   He has known diffuse lumbar spondylosis with DDD, mild spinal stenosis L2-L4, left paracentral disc L5-S1 with moderate central and moderate/severe bilateral foraminal stenosis.   Leg symptoms may be from spinal stenosis at L5-S1. He has no radicular pain or back  pain.   Treatment options discussed with patient and following plan made:   - Referral to PMR at [facility] to consider lumbar injections. He is aware that abscess left medial thigh will need to be healed prior to any injections being done. Seeing general surgeon at [facility] for this today.  - Discussed PT for lumbar spine he wants to hold off.  - Phone follow up with me in 6-8 weeks and prn.   I spent a total of 20 minutes in face-to-face and non-face-to-face activities related to this patient's care today including review of outside records, review of imaging, review of symptoms, physical exam, discussion of differential diagnosis, discussion of treatment options, and documentation.    Andrew Leach PA-C Dept. of Neurosurgery

## 2023-03-22 ENCOUNTER — Encounter: Payer: Self-pay | Admitting: Orthopedic Surgery

## 2023-03-22 ENCOUNTER — Ambulatory Visit (INDEPENDENT_AMBULATORY_CARE_PROVIDER_SITE_OTHER): Payer: Medicare Other | Admitting: Orthopedic Surgery

## 2023-03-22 VITALS — BP 130/72 | Ht 67.0 in | Wt 189.0 lb

## 2023-03-22 DIAGNOSIS — M5126 Other intervertebral disc displacement, lumbar region: Secondary | ICD-10-CM | POA: Diagnosis not present

## 2023-03-22 DIAGNOSIS — M48061 Spinal stenosis, lumbar region without neurogenic claudication: Secondary | ICD-10-CM | POA: Diagnosis not present

## 2023-03-22 DIAGNOSIS — M47816 Spondylosis without myelopathy or radiculopathy, lumbar region: Secondary | ICD-10-CM

## 2023-03-22 NOTE — Patient Instructions (Signed)
It was so nice to see you today. Thank you so much for coming in.    You have some wear and tear (arthritis) in your back. Symptoms in your legs may be coming from spinal stenosis at L5-S1.   I want you to see physical medicine and rehab at the [facility] to discuss possible injections in your back. Dr. Yves Dill, Dr. Mariah Milling, and their PA Alphonzo Lemmings are great and will take good care of you. They should call you to schedule an appointment or you can call them at [phone].   The infection in your left thigh would need to be cleared up prior to getting any injections.   We have a phone visit scheduled in 6-8 weeks. Please do not hesitate to call if you have any questions or concerns. You can also message me in MyChart.   Drake Leach PA-C [phone]

## 2023-05-13 NOTE — Progress Notes (Unsigned)
   Telephone Visit- Progress Note: Referring Physician:  Kandyce Rud, MD [phone] [facility] [place],  [place] [address]  Primary Physician:  Kandyce Rud, MD  This visit was performed via telephone.  Patient location: home Provider location: office  I spent a total of 10 minutes non-face-to-face activities for this visit on the date of this encounter including review of current clinical condition and response to treatment.    Patient has given verbal consent to this telephone visits and we reviewed the limitations of a telephone visit. Patient wishes to proceed.    Chief Complaint:  follow up  History of Present Illness: Andrew Richardson is a 82 y.o. male has a history of CAD, NSTEMI, HTN, hyperlipidemia, CABG x 4.    He is a retired International aid/development worker.    Last seen by me on 03/22/23 with no back or leg pain, but history of legs feeling weak and tired after any walking, even short distances.   He has known diffuse lumbar spondylosis with DDD, mild spinal stenosis L2-L4, left paracentral disc L5-S1 with moderate central and moderate/severe bilateral foraminal stenosis.   He was sent to PMR, but could not have injections as he had thigh wound. He was sent to PT (initial eval on 05/04/23 and had a visit on 05/11/23).   Phone visit to discuss his progress.   His thigh was healing, but now he thinks it is swelling again. He did yard work on Friday and Saturday so he had more pain yesterday. He has only minimal twinges of back pain. No radicular leg pain, but he continues with legs feeling weak and tired after any walking, even short distances. No numbness or tingling.   As above, he has done 2 visits of PT and has not noticed any change yet.    No bowel or bladder issues.    Conservative measures:  Physical therapy: PT at [facility] initial eval on 05/04/23 and had a visit on 05/11/23 Multimodal medical therapy including regular  antiinflammatories: none  Injections: has not received any epidural steroid injections   Past Surgery: denies   Andrew Richardson has no symptoms of cervical myelopathy.   The symptoms are causing a significant impact on the patient's life.   Exam: No exam done as this was a telephone encounter.     Imaging: none  I have personally reviewed the images and agree with the above interpretation.  Assessment and Plan: Andrew Richardson is a pleasant 82 y.o. male with only intermittent twinges of back pain with primary compliant of his legs feeling weak and tired after any walking, even short distances. No numbness or tingling.    He has known diffuse lumbar spondylosis with DDD, mild spinal stenosis L2-L4, left paracentral disc L5-S1 with moderate central and moderate/severe bilateral foraminal stenosis.    Leg symptoms may be from spinal stenosis at L5-S1. He has no radicular pain.    Treatment options discussed with patient and following plan made:    - He will make follow up with general surgery regarding his thigh. He thinks it may need repeat I&D.  - Continue with PT for lumbar spine.  - Keep follow up with [facility]. He knows he can't have injections until thigh is healed.  - Will message him in 4 weeks to check on his progress.   Drake Leach PA-C Neurosurgery

## 2023-05-16 ENCOUNTER — Encounter: Payer: Self-pay | Admitting: Orthopedic Surgery

## 2023-05-16 ENCOUNTER — Ambulatory Visit: Payer: Medicare Other | Admitting: Orthopedic Surgery

## 2023-05-16 DIAGNOSIS — M5126 Other intervertebral disc displacement, lumbar region: Secondary | ICD-10-CM | POA: Diagnosis not present

## 2023-05-16 DIAGNOSIS — M4726 Other spondylosis with radiculopathy, lumbar region: Secondary | ICD-10-CM

## 2023-05-16 DIAGNOSIS — M48061 Spinal stenosis, lumbar region without neurogenic claudication: Secondary | ICD-10-CM | POA: Diagnosis not present

## 2023-05-16 DIAGNOSIS — M5416 Radiculopathy, lumbar region: Secondary | ICD-10-CM

## 2023-05-16 DIAGNOSIS — M47816 Spondylosis without myelopathy or radiculopathy, lumbar region: Secondary | ICD-10-CM

## 2023-07-13 NOTE — Progress Notes (Unsigned)
Referring Physician:  Denton Lank, FNP [address] [place],  [place] [address]  Primary Physician:  Kandyce Rud, MD  History of Present Illness: 07/14/2023 Andrew Richardson is here today with a chief complaint of progressive difficulty in walking. He reports a weakness in the legs that has been present for approximately eight years, with a notable 'foot flop' on the left side. The patient denies significant pain but describes a sensation of discomfort and fatigue in both legs, particularly after periods of standing or walking. This discomfort often necessitates multiple rest stops during ambulation, even over short distances.  He denies any current pain, numbness, or tingling in the legs. The patient has been under the care of two other physicians for the past eight years, discussing this issue.  The patient's primary concern is the progressive weakness in the legs, which has significantly impacted his ability to perform daily activities, such as working in his large yard. He denies any significant pain, numbness, or tingling in the legs. The patient has undergone physical therapy in the past, but it did not yield significant improvement. He has also had his blood vessels checked, which reportedly showed good blood flow.   Bowel/Bladder Dysfunction: none  Conservative measures:  Physical therapy:  has participated in at [facility] from 05/04/23 to 06/15/23 and was discharged Multimodal medical therapy including regular antiinflammatories:  tylenol, gabapentin  Injections:  has received epidural steroid injections 06/07/2023: Bilateral S1 transforaminal ESI(40% relief)   Past Surgery: no previous spinal surgeries   Andrew Richardson has no symptoms of cervical myelopathy.  The symptoms are causing a significant impact on the patient's life.   I have utilized the care everywhere function in epic to review the outside records available from external health  systems.  Review of Systems:  A 10 point review of systems is negative, except for the pertinent positives and negatives detailed in the HPI.  Past Medical History: Past Medical History:  Diagnosis Date   Colon polyps    Coronary artery disease    Depression    Headache    Hyperlipemia    Hypertension    Hypothyroid    Prediabetes    Transitional cell carcinoma of bladder (HCC)    Tubular adenoma of colon     Past Surgical History: Past Surgical History:  Procedure Laterality Date   ANGIOPLASTY     COLONOSCOPY     COLONOSCOPY WITH PROPOFOL N/A 05/20/2015   Procedure: COLONOSCOPY WITH PROPOFOL;  Surgeon: Christena Deem, MD;  Location: [facility] ENDOSCOPY;  Service: Endoscopy;  Laterality: N/A;   CORONARY ARTERY BYPASS GRAFT N/A 10/08/2020   Procedure: CORONARY ARTERY BYPASS GRAFTING TIMES  FOUR  WITH LEFT INTERNAL MAMMARY ARTERY AND ENDOSCOPIC HARVEST OF BILATERAL  LEG SAPHENOUS VEIN;  Surgeon: Corliss Skains, MD;  Location: [facility] OR;  Service: Open Heart Surgery;  Laterality: N/A;   INGUINAL HERNIA REPAIR Left 10/31/2015   Procedure: HERNIA REPAIR INGUINAL ADULT;  Surgeon: Nadeen Landau, MD;  Location: [facility] ORS;  Service: General;  Laterality: Left;   LEFT HEART CATH N/A 10/06/2020   Procedure: Left Heart Cath and Coronary Angiography;  Surgeon: Marcina Millard, MD;  Location: [facility] INVASIVE CV LAB;  Service: Cardiovascular;  Laterality: N/A;   TEE WITHOUT CARDIOVERSION N/A 10/08/2020   Procedure: TRANSESOPHAGEAL ECHOCARDIOGRAM (TEE);  Surgeon: Corliss Skains, MD;  Location: [facility] OR;  Service: Open Heart Surgery;  Laterality: N/A;    Allergies: Allergies as of 07/14/2023 - Review Complete  07/14/2023  Allergen Reaction Noted   Simvastatin Other (See Comments) 04/13/2018   Tetracyclines & related Diarrhea 05/19/2015   Nitrofurantoin Rash 10/23/2015    Medications:  Current Outpatient Medications:    amLODipine (NORVASC) 5 MG tablet, Take 5 mg by mouth every  evening., Disp: , Rfl:    aspirin EC 325 MG EC tablet, Take 1 tablet (325 mg total) by mouth daily., Disp: 30 tablet, Rfl: 0   atorvastatin (LIPITOR) 10 MG tablet, Take 10 mg by mouth every evening., Disp: , Rfl:    levothyroxine (SYNTHROID) 50 MCG tablet, Take 50 mcg by mouth daily., Disp: , Rfl:    losartan (COZAAR) 100 MG tablet, Take 100 mg by mouth every evening., Disp: , Rfl:    metoprolol tartrate (LOPRESSOR) 25 MG tablet, Take 1 tablet (25 mg total) by mouth 2 (two) times daily., Disp: 60 tablet, Rfl: 1   tamsulosin (FLOMAX) 0.4 MG CAPS capsule, Take 0.4 mg by mouth daily., Disp: , Rfl:   Social History: Social History   Tobacco Use   Smoking status: Former    Current packs/day: 0.00    Types: Cigarettes    Quit date: 05/19/1989    Years since quitting: 34.1   Smokeless tobacco: Former  Substance Use Topics   Alcohol use: Yes    Alcohol/week: 3.0 standard drinks of alcohol    Types: 3 Shots of liquor per week   Drug use: No    Family Medical History: Family History  Problem Relation Age of Onset   Rheumatic fever Mother     Physical Examination: Vitals:   07/14/23 1053  BP: 132/78    General: Patient is in no apparent distress. Attention to examination is appropriate.  Neck:   Supple.  Full range of motion.  Respiratory: Patient is breathing without any difficulty.   NEUROLOGICAL:     Awake, alert, oriented to person, place, and time.  Speech is clear and fluent.   Cranial Nerves: Pupils equal round and reactive to light.  Facial tone is symmetric.  Facial sensation is symmetric. Shoulder shrug is symmetric. Tongue protrusion is midline.  There is no pronator drift.  Strength: Side Biceps Triceps Deltoid Interossei Grip Wrist Ext. Wrist Flex.  R 5 5 5 5 5 5 5   L 5 5 5 5 5 5 5    Side Iliopsoas Quads Hamstring PF DF EHL  R 5 5 5 5 5 5   L 5 5 4+ 4- 4+ 4+   Reflexes are 1+ and symmetric at the biceps, triceps, brachioradialis, patella and achilles.    Hoffman's is absent.   Bilateral upper and lower extremity sensation is intact to light touch.    No evidence of dysmetria noted.  Gait is stooped.     Medical Decision Making  Imaging: MRI L spine 03/10/2023 Disc levels:   Disc spaces: Degenerative disease with disc height loss at T11-12, T12-L1, L1-2, L3-4, L4-5 and L5-S1.   T11-12: Mild broad-based disc bulge. No foraminal or central canal stenosis.   T12-L1: Mild broad-based disc bulge. Moderate bilateral facet arthropathy. No foraminal or central canal stenosis.   L1-L2: Mild broad-based disc bulge. Mild bilateral facet arthropathy. No spinal stenosis. Mild left foraminal stenosis. No right foraminal stenosis. Left lateral recess stenosis.   L2-L3: Broad-based disc bulge. Mild bilateral facet arthropathy. Mild spinal stenosis. Bilateral lateral recess stenosis. Mild left foraminal stenosis. No right foraminal stenosis.   L3-L4: Broad-based disc bulge. Mild bilateral facet arthropathy. Mild spinal stenosis. Mild-moderate bilateral foraminal stenosis.  L4-L5: Mild broad-based disc bulge. Moderate right and mild left facet arthropathy. Mild right foraminal stenosis. No left foraminal stenosis. No spinal stenosis.   L5-S1: Broad-based disc bulge with a left paracentral disc extrusion and mass effect on the left intraspinal S1 nerve root. Right subarticular recess stenosis. Moderate spinal stenosis. Moderate-severe bilateral foraminal stenosis.   IMPRESSION: 1. At L5-S1 there is a broad-based disc bulge with a left paracentral disc extrusion and mass effect on the left intraspinal S1 nerve root. Right subarticular recess stenosis. Moderate spinal stenosis. Moderate-severe bilateral foraminal stenosis. 2. Diffuse lumbar spine spondylosis as described above. 3. No acute osseous injury of the lumbar spine.     Electronically Signed   By: Elige Ko M.D.   On: 03/19/2023 06:40  I have personally reviewed the  images and agree with the above interpretation.  Assessment and Plan: Andrew Richardson is a pleasant 82 y.o. 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  He has weakness in his left S1 mediated muscles.  He has some element of discomfort in his legs that could be secondary to neurogenic claudication.  Additionally, he has some weakness in his left leg that has been persistent for 8 years.  Given the length of time he has had weakness, I think it is unlikely that discectomy will improve his weakness.  However, it may help his claudication symptoms.  He is currently helping his wife recover from a hip arthroplasty.  I will see him back in 6 weeks while he considers whether to undergo left-sided L5-S1 discectomy.  I spent a total of 30 minutes in this patient's care today. This time was spent reviewing pertinent records including imaging studies, obtaining and confirming history, performing a directed evaluation, formulating and discussing my recommendations, and documenting the visit within the medical record.      Thank you for involving me in the care of this patient.      Bexley Laubach K. Myer Haff MD, [facility] Neurosurgery

## 2023-07-14 ENCOUNTER — Encounter: Payer: Self-pay | Admitting: Neurosurgery

## 2023-07-14 ENCOUNTER — Ambulatory Visit: Payer: Medicare Other | Admitting: Neurosurgery

## 2023-07-14 VITALS — BP 132/78 | Ht 67.0 in | Wt 193.0 lb

## 2023-07-14 DIAGNOSIS — M48062 Spinal stenosis, lumbar region with neurogenic claudication: Secondary | ICD-10-CM | POA: Diagnosis not present

## 2023-07-14 DIAGNOSIS — M5416 Radiculopathy, lumbar region: Secondary | ICD-10-CM

## 2023-09-01 ENCOUNTER — Other Ambulatory Visit: Payer: Self-pay

## 2023-09-01 ENCOUNTER — Ambulatory Visit: Payer: Medicare Other | Admitting: Neurosurgery

## 2023-09-01 ENCOUNTER — Encounter: Payer: Self-pay | Admitting: Neurosurgery

## 2023-09-01 VITALS — BP 150/66 | Ht 67.0 in | Wt 193.0 lb

## 2023-09-01 DIAGNOSIS — M5416 Radiculopathy, lumbar region: Secondary | ICD-10-CM

## 2023-09-01 DIAGNOSIS — M48062 Spinal stenosis, lumbar region with neurogenic claudication: Secondary | ICD-10-CM | POA: Diagnosis not present

## 2023-09-01 DIAGNOSIS — Z01818 Encounter for other preprocedural examination: Secondary | ICD-10-CM

## 2023-09-01 NOTE — Addendum Note (Signed)
Addended by: Sharlot Gowda on: 09/01/2023 11:18 AM   Modules accepted: Orders

## 2023-09-01 NOTE — Progress Notes (Signed)
Referring Physician:  Kandyce Rud, MD [phone] [facility] [place],  [place] [address]  Primary Physician:  Kandyce Rud, MD  History of Present Illness: 09/01/2023  Andrew Richardson returns to discuss his symptoms.  He has continued left leg weakness.  07/14/2023 Mr. Andrew Richardson is here today with a chief complaint of progressive difficulty in walking. He reports a weakness in the legs that has been present for approximately eight years, with a notable 'foot flop' on the left side. The patient denies significant pain but describes a sensation of discomfort and fatigue in both legs, particularly after periods of standing or walking. This discomfort often necessitates multiple rest stops during ambulation, even over short distances.  He denies any current pain, numbness, or tingling in the legs. The patient has been under the care of two other physicians for the past eight years, discussing this issue.  The patient's primary concern is the progressive weakness in the legs, which has significantly impacted his ability to perform daily activities, such as working in his large yard. He denies any significant pain, numbness, or tingling in the legs. The patient has undergone physical therapy in the past, but it did not yield significant improvement. He has also had his blood vessels checked, which reportedly showed good blood flow.   Bowel/Bladder Dysfunction: none  Conservative measures:  Physical therapy:  has participated in at [facility] from 05/04/23 to 06/15/23 and was discharged Multimodal medical therapy including regular antiinflammatories:  tylenol, gabapentin  Injections:  has received epidural steroid injections 06/07/2023: Bilateral S1 transforaminal ESI(40% relief)   Past Surgery: no previous spinal surgeries   Andrew Richardson has no symptoms of cervical myelopathy.  The symptoms are causing a significant impact on the  patient's life.   I have utilized the care everywhere function in epic to review the outside records available from external health systems.  Review of Systems:  A 10 point review of systems is negative, except for the pertinent positives and negatives detailed in the HPI.  Past Medical History: Past Medical History:  Diagnosis Date   Colon polyps    Coronary artery disease    Depression    Headache    Hyperlipemia    Hypertension    Hypothyroid    Prediabetes    Transitional cell carcinoma of bladder (HCC)    Tubular adenoma of colon     Past Surgical History: Past Surgical History:  Procedure Laterality Date   ANGIOPLASTY     COLONOSCOPY     COLONOSCOPY WITH PROPOFOL N/A 05/20/2015   Procedure: COLONOSCOPY WITH PROPOFOL;  Surgeon: Christena Deem, MD;  Location: [facility] ENDOSCOPY;  Service: Endoscopy;  Laterality: N/A;   CORONARY ARTERY BYPASS GRAFT N/A 10/08/2020   Procedure: CORONARY ARTERY BYPASS GRAFTING TIMES  FOUR  WITH LEFT INTERNAL MAMMARY ARTERY AND ENDOSCOPIC HARVEST OF BILATERAL  LEG SAPHENOUS VEIN;  Surgeon: Corliss Skains, MD;  Location: [facility] OR;  Service: Open Heart Surgery;  Laterality: N/A;   INGUINAL HERNIA REPAIR Left 10/31/2015   Procedure: HERNIA REPAIR INGUINAL ADULT;  Surgeon: Nadeen Landau, MD;  Location: [facility] ORS;  Service: General;  Laterality: Left;   LEFT HEART CATH N/A 10/06/2020   Procedure: Left Heart Cath and Coronary Angiography;  Surgeon: Marcina Millard, MD;  Location: [facility] INVASIVE CV LAB;  Service: Cardiovascular;  Laterality: N/A;   TEE WITHOUT CARDIOVERSION N/A 10/08/2020   Procedure: TRANSESOPHAGEAL ECHOCARDIOGRAM (TEE);  Surgeon: Corliss Skains,  MD;  Location: [facility] OR;  Service: Open Heart Surgery;  Laterality: N/A;    Allergies: Allergies as of 09/01/2023 - Review Complete 09/01/2023  Allergen Reaction Noted   Simvastatin Other (See Comments) 04/13/2018   Tetracyclines & related Diarrhea 05/19/2015   Nitrofurantoin  Rash 10/23/2015    Medications:  Current Outpatient Medications:    amLODipine (NORVASC) 5 MG tablet, Take 5 mg by mouth every evening., Disp: , Rfl:    aspirin EC 325 MG EC tablet, Take 1 tablet (325 mg total) by mouth daily., Disp: 30 tablet, Rfl: 0   atorvastatin (LIPITOR) 10 MG tablet, Take 10 mg by mouth every evening., Disp: , Rfl:    levothyroxine (SYNTHROID) 50 MCG tablet, Take 50 mcg by mouth daily., Disp: , Rfl:    metoprolol tartrate (LOPRESSOR) 25 MG tablet, Take 1 tablet (25 mg total) by mouth 2 (two) times daily., Disp: 60 tablet, Rfl: 1   tamsulosin (FLOMAX) 0.4 MG CAPS capsule, Take 0.4 mg by mouth daily., Disp: , Rfl:    telmisartan-hydrochlorothiazide (MICARDIS HCT) 80-12.5 MG tablet, SMARTSIG:1.0 Tablet(s) By Mouth Daily, Disp: , Rfl:   Social History: Social History   Tobacco Use   Smoking status: Former    Current packs/day: 0.00    Types: Cigarettes    Quit date: 05/19/1989    Years since quitting: 34.3   Smokeless tobacco: Former  Substance Use Topics   Alcohol use: Yes    Alcohol/week: 3.0 standard drinks of alcohol    Types: 3 Shots of liquor per week   Drug use: No    Family Medical History: Family History  Problem Relation Age of Onset   Rheumatic fever Mother     Physical Examination: Vitals:   09/01/23 1038  BP: (!) 150/66    General: Patient is in no apparent distress. Attention to examination is appropriate.  Neck:   Supple.  Full range of motion.  Respiratory: Patient is breathing without any difficulty.   NEUROLOGICAL:     Awake, alert, oriented to person, place, and time.  Speech is clear and fluent.   Cranial Nerves: Pupils equal round and reactive to light.  Facial tone is symmetric.  Facial sensation is symmetric. Shoulder shrug is symmetric. Tongue protrusion is midline.  There is no pronator drift.  Strength: Side Biceps Triceps Deltoid Interossei Grip Wrist Ext. Wrist Flex.  R 5 5 5 5 5 5 5   L 5 5 5 5 5 5 5    Side  Iliopsoas Quads Hamstring PF DF EHL  R 5 5 5 5 5 5   L 5 5 4+ 4- 4+ 4+   Reflexes are 1+ and symmetric at the biceps, triceps, brachioradialis, patella and achilles.   Hoffman's is absent.   Bilateral upper and lower extremity sensation is intact to light touch.    No evidence of dysmetria noted.  Gait is stooped.     Medical Decision Making  Imaging: MRI L spine 03/10/2023 Disc levels:   Disc spaces: Degenerative disease with disc height loss at T11-12, T12-L1, L1-2, L3-4, L4-5 and L5-S1.   T11-12: Mild broad-based disc bulge. No foraminal or central canal stenosis.   T12-L1: Mild broad-based disc bulge. Moderate bilateral facet arthropathy. No foraminal or central canal stenosis.   L1-L2: Mild broad-based disc bulge. Mild bilateral facet arthropathy. No spinal stenosis. Mild left foraminal stenosis. No right foraminal stenosis. Left lateral recess stenosis.   L2-L3: Broad-based disc bulge. Mild bilateral facet arthropathy. Mild spinal stenosis. Bilateral lateral recess stenosis. Mild  left foraminal stenosis. No right foraminal stenosis.   L3-L4: Broad-based disc bulge. Mild bilateral facet arthropathy. Mild spinal stenosis. Mild-moderate bilateral foraminal stenosis.   L4-L5: Mild broad-based disc bulge. Moderate right and mild left facet arthropathy. Mild right foraminal stenosis. No left foraminal stenosis. No spinal stenosis.   L5-S1: Broad-based disc bulge with a left paracentral disc extrusion and mass effect on the left intraspinal S1 nerve root. Right subarticular recess stenosis. Moderate spinal stenosis. Moderate-severe bilateral foraminal stenosis.   IMPRESSION: 1. At L5-S1 there is a broad-based disc bulge with a left paracentral disc extrusion and mass effect on the left intraspinal S1 nerve root. Right subarticular recess stenosis. Moderate spinal stenosis. Moderate-severe bilateral foraminal stenosis. 2. Diffuse lumbar spine spondylosis as described  above. 3. No acute osseous injury of the lumbar spine.     Electronically Signed   By: Elige Ko M.D.   On: 03/19/2023 06:40  I have personally reviewed the images and agree with the above interpretation.  Assessment and Plan: Mr. Campillo is a pleasant 82 y.o. 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  He has weakness in his left S1 mediated muscles.  He has some element of discomfort in his legs that could be secondary to neurogenic claudication.  Additionally, he has some weakness in his left leg that has been persistent for 8 years.  Given the length of time he has had weakness, I think it is unlikely that discectomy will improve his weakness.  That being said, it is still reasonable to consider discectomy given that it is a low risk surgery.  He would like to proceed with this.  He expresses understanding of the chances that his motor weakness will improve.   I discussed the planned procedure at length with the patient, including the risks, benefits, alternatives, and indications. The risks discussed include but are not limited to bleeding, infection, need for reoperation, spinal fluid leak, stroke, vision loss, anesthetic complication, coma, paralysis, and even death. I also described in detail that improvement was not guaranteed.  The patient expressed understanding of these risks, and asked that we proceed with surgery. I described the surgery in layman's terms, and gave ample opportunity for questions, which were answered to the best of my ability.  I spent a total of 10 minutes in this patient's care today. This time was spent reviewing pertinent records including imaging studies, obtaining and confirming history, performing a directed evaluation, formulating and discussing my recommendations, and documenting the visit within the medical record.      Thank you for involving me in the care of this patient.      Claudine Stallings K.  Myer Haff MD, [facility] Neurosurgery

## 2023-09-01 NOTE — Patient Instructions (Signed)
  Please see below for information in regards to your upcoming surgery:   Planned surgery: Left L5-S1 microdiscectomy   Surgery date: 10/05/23 at [facility] (Medical Mall: [address] [place], [place] [address]) - you will find out your arrival time the business day before your surgery.   Pre-op appointment at [facility] Pre-admit Testing: we will call you with a date/time for this. If you are scheduled for an in person appointment, Pre-admit Testing is located on the first floor of the [facility] building, [address], [address]. Please bring all prescriptions in the original prescription bottles to your appointment. During this appointment, they will advise you which medications you can take the morning of surgery, and which medications you will need to hold for surgery. Labs (such as blood work, EKG) may be done at your pre-op appointment. You are not required to fast for these labs. Should you need to change your pre-op appointment, please call Pre-admit testing at [phone].     Blood thinners:    Aspirin:  OK to stay on aspirin 81mg     Surgical clearance: we will send a clearance form to Dr Larwance Sachs. They may wish to see you in their office prior to signing the clearance form. If so, they may call you to schedule an appointment.    Common restrictions after surgery: No bending, lifting, or twisting ("BLT"). Avoid lifting objects heavier than 10 pounds for the first 6 weeks after surgery. Where possible, avoid household activities that involve lifting, bending, reaching, pushing, or pulling such as laundry, vacuuming, grocery shopping, and childcare. Try to arrange for help from friends and family for these activities while you heal. Do not drive while taking prescription pain medication. Weeks 6 through 12 after surgery: avoid lifting more than 25 pounds.     How to contact us:  If you have any questions/concerns before or after  surgery, you can reach [place] at [phone], or you can send a mychart message. We can be reached by phone or mychart 8am-4pm, Monday-Friday.  *Please note: Calls after 4pm are forwarded to a third party answering service. Mychart messages are not routinely monitored during evenings, weekends, and holidays. Please call our office to contact the answering service for urgent concerns during non-business hours.     Appointments/FMLA & disability paperwork: Joycelyn Rua, & Flonnie Hailstone Registered Nurse/Surgery scheduler: Royston Cowper Medical Assistants: Nash Mantis Physician Assistants: Joan Flores, PA-C, Manning Charity, PA-C & Drake Leach, PA-C Surgeons: Venetia Night, MD & Ernestine Mcmurray, MD

## 2023-09-23 ENCOUNTER — Encounter
Admission: RE | Admit: 2023-09-23 | Discharge: 2023-09-23 | Disposition: A | Discharge status: Home / Self Care | Payer: Medicare Other | Source: Ambulatory Visit | Attending: Neurosurgery | Admitting: Neurosurgery

## 2023-09-23 ENCOUNTER — Other Ambulatory Visit: Payer: Self-pay

## 2023-09-23 VITALS — BP 133/75 | HR 53 | Temp 97.5°F | Resp 18 | Ht 67.0 in | Wt 198.8 lb

## 2023-09-23 DIAGNOSIS — I25810 Atherosclerosis of coronary artery bypass graft(s) without angina pectoris: Secondary | ICD-10-CM | POA: Insufficient documentation

## 2023-09-23 DIAGNOSIS — Z951 Presence of aortocoronary bypass graft: Secondary | ICD-10-CM | POA: Diagnosis not present

## 2023-09-23 DIAGNOSIS — Z01818 Encounter for other preprocedural examination: Secondary | ICD-10-CM | POA: Insufficient documentation

## 2023-09-23 DIAGNOSIS — Z01812 Encounter for preprocedural laboratory examination: Secondary | ICD-10-CM

## 2023-09-23 DIAGNOSIS — I10 Essential (primary) hypertension: Secondary | ICD-10-CM | POA: Insufficient documentation

## 2023-09-23 DIAGNOSIS — I739 Peripheral vascular disease, unspecified: Secondary | ICD-10-CM | POA: Diagnosis not present

## 2023-09-23 DIAGNOSIS — Z01810 Encounter for preprocedural cardiovascular examination: Secondary | ICD-10-CM | POA: Diagnosis present

## 2023-09-23 HISTORY — DX: Non-ST elevation (NSTEMI) myocardial infarction: I21.4

## 2023-09-23 HISTORY — DX: Atherosclerosis of coronary artery bypass graft(s) without angina pectoris: I25.810

## 2023-09-23 HISTORY — DX: Peripheral vascular disease, unspecified: I73.9

## 2023-09-23 LAB — URINALYSIS, COMPLETE (UACMP) WITH MICROSCOPIC
Bacteria, UA: NONE SEEN
Bilirubin Urine: NEGATIVE
Glucose, UA: NEGATIVE mg/dL
Hgb urine dipstick: NEGATIVE
Ketones, ur: NEGATIVE mg/dL
Leukocytes,Ua: NEGATIVE
Nitrite: NEGATIVE
Protein, ur: NEGATIVE mg/dL
Specific Gravity, Urine: 1.021 (ref 1.005–1.030)
Squamous Epithelial / HPF: 0 /[HPF] (ref 0–5)
pH: 8 (ref 5.0–8.0)

## 2023-09-23 LAB — TYPE AND SCREEN
ABO/RH(D): A POS
Antibody Screen: NEGATIVE

## 2023-09-23 LAB — BASIC METABOLIC PANEL
Anion gap: 10 (ref 5–15)
BUN: 22 mg/dL (ref 8–23)
CO2: 27 mmol/L (ref 22–32)
Calcium: 8.8 mg/dL — ABNORMAL LOW (ref 8.9–10.3)
Chloride: 97 mmol/L — ABNORMAL LOW (ref 98–111)
Creatinine, Ser: 0.85 mg/dL (ref 0.61–1.24)
GFR, Estimated: >60 mL/min (ref >60)
Glucose, Bld: 121 mg/dL — ABNORMAL HIGH (ref 70–99)
Potassium: 3.7 mmol/L (ref 3.5–5.1)
Sodium: 134 mmol/L — ABNORMAL LOW (ref 135–145)

## 2023-09-23 LAB — CBC
HCT: 37.1 % — ABNORMAL LOW (ref 39.0–52.0)
Hemoglobin: 12.7 g/dL — ABNORMAL LOW (ref 13.0–17.0)
MCH: 34.1 pg — ABNORMAL HIGH (ref 26.0–34.0)
MCHC: 34.2 g/dL (ref 30.0–36.0)
MCV: 99.7 fL (ref 80.0–100.0)
Platelets: 189 10*3/uL (ref 150–400)
RBC: 3.72 MIL/uL — ABNORMAL LOW (ref 4.22–5.81)
RDW: 12.7 % (ref 11.5–15.5)
WBC: 5.4 10*3/uL (ref 4.0–10.5)
nRBC: 0 % (ref 0.0–0.2)

## 2023-09-23 LAB — SURGICAL PCR SCREEN
MRSA, PCR: NEGATIVE
Staphylococcus aureus: NEGATIVE

## 2023-09-23 NOTE — Pre-Procedure Instructions (Signed)
Progress Notes - documented in this encounter  Andrew Millard, MD - 08/29/2023 10:45 AM EST Formatting of this note is different from the original. Established Patient Visit  Chief Complaint: Chief Complaint Patient presents with Follow-up 4 month Date of Service: 08/29/2023 Date of Birth: 20-Apr-1941 PCP: Nadara Mustard, MD History of Present Illness: Mr. Andrew Richardson is a 82 y.o.male patient who returns for  1. Status post PTCA 1992 at [facility] 2. Status post PTCA 1998 at [facility] 3. Essential hypertension 4. Hyperlipidemia 5. PVCs 6. Statin intolerance 7. History of tobacco use 8. NSTEMI 10/05/2020 9. 80% mid LAD, 80% OM1, 75% RCA by cardiac cath 10/06/2020 10. CABG x4, LIMA to LAD, SVG to OM1, D1, PDA 10/08/2020 11. Postoperative atrial fibrillation 12. Sinus bradycardia  The patient was hospitalized 10/05/2020 with non-ST elevation myocardial infarction. Cardiac catheterization 10/06/2020 revealed 80% stenosis mid LAD, 80% stenosis OM1, 75% stenosis mid RCA. The patient was transferred to [facility] and underwent CABG times 41/08/2021 with LIMA to LAD, SVG to OM1, D1, and PDA. Patient developed postoperative atrial fibrillation, converted to sinus rhythm and discharged home on amiodarone. TEE 10/07/2020 revealed LVEF 50 to 55%.  The patient returns today, reports "doing all right". He denies exertional chest pain. He reports mild exertional shortness of breath. He denies palpitations or heart racing. He reports chronic peripheral edema for several years, slightly improved. The patient has been less active, does not exercise, limited by bilateral leg weakness, and chronic low back pain, due to lumbar disc disease followed by Dr. Myer Haff. ECG 01/27/2022 revealed sinus bradycardia at 50 bpm with first-degree AV block. 72-hour Holter monitor 01/27/2022- 01/30/2022 revealed predominant sinus rhythm with mean heart rate of 63 bpm, heart rate range 40 to 99 bpm, and occasional premature atrial contractions.  Heart rate today 71 bpm.  The patient has essential hypertension, blood pressure well-controlled, currently on losartan, hydrochlorothiazide, amlodipine and low-dose metoprolol tartrate, which are well tolerated without apparent side effects, with the exception of bradycardia. Heart rate today is 67 bpm. The patient denies presyncope, syncope or dizziness. The patient follows a low-sodium, no added salt diet.  The patient has hyperlipidemia. LDL cholesterol was 85 on 01/03/8118, currently on atorvastatin, which is tolerated well without apparent side effects. The patient follows a low-cholesterol, low-fat diet.  Past Medical and Surgical History Past Medical History Past Medical History: Diagnosis Date Benign neoplasm of colon Colon polyp Coronary atherosclerosis of native coronary artery Depression Diverticulosis 05/20/2015 History of colonic polyps Hyperlipidemia Hypertension Internal hemorrhoids 05/20/2015 Migraines Transitional cell carcinoma, bladder (CMS/HHS-HCC) Status post resection. Without recurrence. Tubular adenoma of colon 05/20/2015  Past Surgical History He has a past surgical history that includes Colonoscopy (03/09/2010) and Colonoscopy (05/20/2015).  Medications and Allergies Current Medications  Current Outpatient Medications Medication Sig Dispense Refill amLODIPine (NORVASC) 5 MG tablet Take 1 tablet (5 mg total) by mouth once daily 30 tablet 11 aspirin 81 MG EC tablet Take 1 tablet (81 mg total) by mouth once daily 30 tablet 11 atorvastatin (LIPITOR) 10 MG tablet Take 0.5 tablets (5 mg total) by mouth every evening 90 tablet 3 gabapentin (NEURONTIN) 100 MG capsule 1 po qHS 30 capsule 1 levothyroxine (SYNTHROID) 75 MCG tablet TAKE 1 TABLET(75 MCG) BY MOUTH EVERY DAY 30 TO 60 MINUTES BEFORE BREAKFAST ON AN EMPTY STOMACH AND WITH A GLASS OF WATER 90 tablet 1 metoprolol tartrate (LOPRESSOR) 25 MG tablet TAKE 1/2 TABLET(12.5 MG) BY MOUTH TWICE DAILY 90 tablet  1 tamsulosin (FLOMAX) 0.4 mg capsule TAKE 1 CAPSULE(0.4 MG)  BY MOUTH EVERY DAY 30 MINUTES AFTER THE SAME MEAL 90 capsule 3 telmisartan-hydroCHLOROthiazide (MICARDIS HCT) 80-12.5 mg tablet Take 1 tablet by mouth once daily 90 tablet 1  No current facility-administered medications for this visit.  Allergies: Simvastatin (bulk), Tetracycline, and Other  Social and Family History Social History reports that he quit smoking about 33 years ago. His smoking use included cigarettes. He has never been exposed to tobacco smoke. He has never used smokeless tobacco. He reports current alcohol use. He reports that he does not use drugs.  Family History Family History Problem Relation Name Age of Onset Coronary Artery Disease (Blocked arteries around heart) Mother Coronary Artery Disease (Blocked arteries around heart) Father Coronary artery bypass surgery Colon cancer Neg Hx Colon polyps Neg Hx Liver disease Neg Hx Rectal cancer Neg Hx Ulcers Neg Hx  Review of Systems  Review of Systems: The patient denies chest pain, reports chronic exertional shortness of breath, without orthopnea, paroxysmal nocturnal dyspnea, with chronic pedal edema, without palpitations, heart racing, presyncope, syncope, with leg weakness, generalized weakness, without claudication. Review of 10 Systems is negative except as described above.  Physical Examination  Vitals: BP 126/74  Pulse 67  Ht 170.2 cm (5\' 7" )  Wt 89.2 kg (196 lb 9.6 oz)  SpO2 99%  BMI 30.79 kg/m Ht:170.2 cm (5\' 7" ) Wt:89.2 kg (196 lb 9.6 oz) JYN:WGNF surface area is 2.05 meters squared. Body mass index is 30.79 kg/m.  General: Alert and oriented. Well-appearing. No acute distress. HEENT: Pupils equally reactive to light and accomodation Neck: Supple, no JVD Lungs: Normal effort of breathing; clear to auscultation bilaterally; no wheezes, rales, rhonchi Heart: Regular rhythm, bradycardic. No murmur, rub, or gallop Abdomen:  nondistended Extremities: no cyanosis, clubbing. With mild lower extremity edema bilaterally Peripheral Pulses: 2+ radial Skin: Warm, dry, no diaphoresis  Assessment  82 y.o. male with 1. Essential (primary) hypertension 2. H/O angioplasty 3. Essential hypertension, benign 4. Atherosclerosis of native coronary artery of native heart without angina pectoris 5. NSTEMI (non-ST elevation myocardial infarction) (CMS/HHS-HCC) 6. 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 Bradycardia, sinus 8. Pure hypercholesterolemia 9. Type 2 diabetes mellitus without complication, without long-term current use of insulin (CMS/HHS-HCC)  82 year old gentleman with known coronary disease, recently admitted with non-ST elevation myocardial infarction 10/05/2020, cardiac catheterization revealing three-vessel coronary artery disease. The patient underwent CABG x4 with LIMA to LAD, and SVG to OM1, D1 and PDA. Postoperative transesophageal echocardiogram 10/07/2020 revealed LVEF 50 to 55%. The patient developed postoperative atrial fibrillation, converted to sinus rhythm, discharged home on amiodarone. The patient has essential hypertension, systolic blood pressure mildly elevated on current BP medications, due to dietary noncompliance. The patient has hyperlipidemia, on atorvastatin. Patient reports worsening peripheral edema, on HCTZ, and worsening leg fatigue. ECG 02/18/2022 revealing sinus bradycardia at 50 bpm. 72-hour Holter monitor 01/27/2022 - 01/30/2022 revealed predominant sinus rhythm with mean heart rate of 63 bpm, heart rate range 40 to 99 bpm. Heart rate today is 67 bpm  Plan  1. Continue current medications 2. Counseled patient about low-sodium diet 3. DASH diet printed instructions given to the patient 4. Counseled patient about low-cholesterol diet 5. Continue atorvastatin for hyperlipidemia management 6. Low-fat and cholesterol diet printed instructions given to the  patient 7. Continue metoprolol tartrate to 12.5 mg twice daily 8. Defer permanent pacemaker implantation at this time 9. Return to clinic for follow-up in 4 months  No orders of the defined types were placed in this encounter.  Return in  about 4 months (around 12/28/2023).  Andrew Millard, MD PhD [facility]   Electronically signed by Andrew Millard, MD at 08/29/2023 10:44 AM EST

## 2023-09-23 NOTE — Patient Instructions (Addendum)
Your procedure is scheduled on: Wednesday January 8  Report to the Registration Desk on the 1st floor of the [facility] To find out your arrival time, please call [phone] between 1PM - 3PM on:  Tuesday January 7  If your arrival time is 6:00 am, do not arrive before that time as the [facility] entrance doors do not open until 6:00 am.  REMEMBER: Instructions that are not followed completely may result in serious medical risk, up to and including death; or upon the discretion of your surgeon and anesthesiologist your surgery may need to be rescheduled.  Do not eat food after midnight the night before surgery.  No gum chewing or hard candies.  You may however, drink CLEAR liquids up to 2 hours before you are scheduled to arrive for your surgery. Do not drink anything within 2 hours of your scheduled arrival time.  Clear liquids include: - water  - apple juice without pulp - gatorade (not RED colors) - black coffee or tea (Do NOT add milk or creamers to the coffee or tea) Do NOT drink anything that is not on this list.  One week prior to surgery:  Starting Wednesday January 1  Stop Anti-inflammatories (NSAIDS) such as Advil, Aleve, Ibuprofen, Motrin, Naproxen, Naprosyn and Aspirin based products such as Excedrin, Goody's Powder, BC Powder. Stop ANY OVER THE COUNTER supplements until after surgery.  You may however, continue to take Tylenol if needed for pain up until the day of surgery.  **Follow recommendations regarding stopping blood thinners.** Aspirin:  OK to stay on aspirin 81mg    Continue taking all of your other prescription medications up until the day of surgery.  ON THE DAY OF SURGERY ONLY TAKE THESE MEDICATIONS WITH SIPS OF WATER:  metoprolol tartrate (LOPRESSOR)  levothyroxine (SYNTHROID)  tamsulosin (FLOMAX)    No Alcohol for 24 hours before or after surgery.  No Smoking including e-cigarettes for 24 hours before surgery.  No chewable tobacco products  for at least 6 hours before surgery.  No nicotine patches on the day of surgery.  Do not use any "recreational" drugs for at least a week (preferably 2 weeks) before your surgery.  Please be advised that the combination of cocaine and anesthesia may have negative outcomes, up to and including death. If you test positive for cocaine, your surgery will be cancelled.  On the morning of surgery brush your teeth with toothpaste and water, you may rinse your mouth with mouthwash if you wish. Do not swallow any toothpaste or mouthwash.  Use CHG Soap as directed on instruction sheet.  Do not wear jewelry, make-up, hairpins, clips or nail polish.  For welded (permanent) jewelry: bracelets, anklets, waist bands, etc.  Please have this removed prior to surgery.  If it is not removed, there is a chance that hospital personnel will need to cut it off on the day of surgery.  Do not wear lotions, powders, or perfumes.   Do not shave body hair from the neck down 48 hours before surgery.  Contact lenses, hearing aids and dentures may not be worn into surgery.  Do not bring valuables to the hospital. [facility] is not responsible for any missing/lost belongings or valuables.   Notify your doctor if there is any change in your medical condition (cold, fever, infection).  Wear comfortable clothing (specific to your surgery type) to the hospital.  After surgery, you can help prevent lung complications by doing breathing exercises.  Take deep breaths and cough every  1-2 hours.    If you are being discharged the day of surgery, you will not be allowed to drive home. You will need a responsible individual to drive you home and stay with you for 24 hours after surgery.   If you are taking public transportation, you will need to have a responsible individual with you.  Please call the Pre-admissions Testing Dept. at [phone] if you have any questions about these instructions.  Surgery Visitation  Policy:  Patients having surgery or a procedure may have two visitors.  Children under the age of 48 must have an adult with them who is not the patient.         Preparing for Surgery with CHLORHEXIDINE GLUCONATE (CHG) Soap  Chlorhexidine Gluconate (CHG) Soap  o An antiseptic cleaner that kills germs and bonds with the skin to continue killing germs even after washing  o Used for showering the night before surgery and morning of surgery  Before surgery, you can play an important role by reducing the number of germs on your skin.  CHG (Chlorhexidine gluconate) soap is an antiseptic cleanser which kills germs and bonds with the skin to continue killing germs even after washing.  Please do not use if you have an allergy to CHG or antibacterial soaps. If your skin becomes reddened/irritated stop using the CHG.  1. Shower the NIGHT BEFORE SURGERY and the MORNING OF SURGERY with CHG soap.  2. If you choose to wash your hair, wash your hair first as usual with your normal shampoo.  3. After shampooing, rinse your hair and body thoroughly to remove the shampoo.  4. Use CHG as you would any other liquid soap. You can apply CHG directly to the skin and wash gently with a scrungie or a clean washcloth.  5. Apply the CHG soap to your body only from the neck down. Do not use on open wounds or open sores. Avoid contact with your eyes, ears, mouth, and genitals (private parts). Wash face and genitals (private parts) with your normal soap.  6. Wash thoroughly, paying special attention to the area where your surgery will be performed.  7. Thoroughly rinse your body with warm water.  8. Do not shower/wash with your normal soap after using and rinsing off the CHG soap.  9. Pat yourself dry with a clean towel.  10. Wear clean pajamas to bed the night before surgery.  12. Place clean sheets on your bed the night of your first shower and do not sleep with pets.  13. Shower again with the CHG  soap on the day of surgery prior to arriving at the hospital.  14. Do not apply any deodorants/lotions/powders.  15. Please wear clean clothes to the hospital.

## 2023-09-23 NOTE — Pre-Procedure Instructions (Signed)
Medical clearance on chart from Dr Windy Fast risk  Cardiac clearance on chart from Dr Patrica Duel to continue 81 mg asa per cardiology

## 2023-10-05 ENCOUNTER — Other Ambulatory Visit: Payer: Self-pay

## 2023-10-05 ENCOUNTER — Ambulatory Visit: Payer: Medicare Other | Admitting: Certified Registered"

## 2023-10-05 ENCOUNTER — Ambulatory Visit: Payer: Medicare Other | Admitting: Urgent Care

## 2023-10-05 ENCOUNTER — Encounter: Payer: Self-pay | Admitting: Neurosurgery

## 2023-10-05 ENCOUNTER — Encounter
Admission: RE | Disposition: A | Discharge status: Home / Self Care | Payer: Self-pay | Source: Home / Self Care | Attending: Neurosurgery

## 2023-10-05 ENCOUNTER — Observation Stay
Admission: RE | Admit: 2023-10-05 | Discharge: 2023-10-06 | Disposition: A | Discharge status: Home / Self Care | Payer: Medicare Other | Attending: Neurosurgery | Admitting: Neurosurgery

## 2023-10-05 ENCOUNTER — Ambulatory Visit: Payer: Medicare Other

## 2023-10-05 DIAGNOSIS — E039 Hypothyroidism, unspecified: Secondary | ICD-10-CM | POA: Diagnosis not present

## 2023-10-05 DIAGNOSIS — Z87891 Personal history of nicotine dependence: Secondary | ICD-10-CM | POA: Insufficient documentation

## 2023-10-05 DIAGNOSIS — Z01818 Encounter for other preprocedural examination: Secondary | ICD-10-CM

## 2023-10-05 DIAGNOSIS — M5416 Radiculopathy, lumbar region: Principal | ICD-10-CM

## 2023-10-05 DIAGNOSIS — I10 Essential (primary) hypertension: Secondary | ICD-10-CM | POA: Insufficient documentation

## 2023-10-05 DIAGNOSIS — Z8551 Personal history of malignant neoplasm of bladder: Secondary | ICD-10-CM | POA: Diagnosis not present

## 2023-10-05 DIAGNOSIS — Z951 Presence of aortocoronary bypass graft: Secondary | ICD-10-CM | POA: Diagnosis not present

## 2023-10-05 DIAGNOSIS — I2510 Atherosclerotic heart disease of native coronary artery without angina pectoris: Secondary | ICD-10-CM | POA: Insufficient documentation

## 2023-10-05 DIAGNOSIS — M5117 Intervertebral disc disorders with radiculopathy, lumbosacral region: Principal | ICD-10-CM | POA: Insufficient documentation

## 2023-10-05 DIAGNOSIS — R2689 Other abnormalities of gait and mobility: Secondary | ICD-10-CM | POA: Insufficient documentation

## 2023-10-05 HISTORY — PX: LUMBAR LAMINECTOMY/DECOMPRESSION MICRODISCECTOMY: SHX5026

## 2023-10-05 SURGERY — LUMBAR LAMINECTOMY/DECOMPRESSION MICRODISCECTOMY 1 LEVEL
Anesthesia: General | Site: Spine Lumbar | Laterality: Left

## 2023-10-05 MED ORDER — LEVOTHYROXINE SODIUM 75 MCG PO TABS
75.0000 ug | ORAL_TABLET | Freq: Every day | ORAL | Status: DC
  Administered 2023-10-06: 75 ug via ORAL
  Filled 2023-10-05: qty 1

## 2023-10-05 MED ORDER — GLYCOPYRROLATE 0.2 MG/ML IJ SOLN
INTRAMUSCULAR | Status: AC
  Filled 2023-10-05: qty 1

## 2023-10-05 MED ORDER — TELMISARTAN-HCTZ 80-12.5 MG PO TABS: 1.0000 | ORAL_TABLET | Freq: Every day | ORAL | Status: DC

## 2023-10-05 MED ORDER — POLYETHYLENE GLYCOL 3350 17 G PO PACK: 17.0000 g | PACK | Freq: Every day | ORAL | Status: DC | PRN

## 2023-10-05 MED ORDER — METHOCARBAMOL 1000 MG/10ML IJ SOLN: 500.0000 mg | Freq: Four times a day (QID) | INTRAMUSCULAR | Status: DC | PRN

## 2023-10-05 MED ORDER — ACETAMINOPHEN 325 MG PO TABS: 650.0000 mg | ORAL_TABLET | ORAL | Status: DC | PRN

## 2023-10-05 MED ORDER — FENTANYL CITRATE (PF) 100 MCG/2ML IJ SOLN
INTRAMUSCULAR | Status: AC
  Filled 2023-10-05: qty 2

## 2023-10-05 MED ORDER — FENTANYL CITRATE (PF) 100 MCG/2ML IJ SOLN: 25.0000 ug | INTRAMUSCULAR | Status: DC | PRN

## 2023-10-05 MED ORDER — EPHEDRINE 5 MG/ML INJ
INTRAVENOUS | Status: AC
  Filled 2023-10-05: qty 5

## 2023-10-05 MED ORDER — ONDANSETRON HCL 4 MG/2ML IJ SOLN
INTRAMUSCULAR | Status: AC
  Filled 2023-10-05: qty 2

## 2023-10-05 MED ORDER — ACETAMINOPHEN 500 MG PO TABS
1000.0000 mg | ORAL_TABLET | Freq: Four times a day (QID) | ORAL | Status: DC
  Administered 2023-10-05 – 2023-10-06 (×3): 1000 mg via ORAL

## 2023-10-05 MED ORDER — SORBITOL 70 % SOLN: 30.0000 mL | Freq: Every day | Status: DC | PRN

## 2023-10-05 MED ORDER — BUPIVACAINE LIPOSOME 1.3 % IJ SUSP
INTRAMUSCULAR | Status: AC
  Filled 2023-10-05: qty 20

## 2023-10-05 MED ORDER — KETOROLAC TROMETHAMINE 15 MG/ML IJ SOLN
INTRAMUSCULAR | Status: AC
  Filled 2023-10-05: qty 1

## 2023-10-05 MED ORDER — ATORVASTATIN CALCIUM 10 MG PO TABS
10.0000 mg | ORAL_TABLET | Freq: Every evening | ORAL | Status: DC
  Administered 2023-10-05: 10 mg via ORAL

## 2023-10-05 MED ORDER — PROPOFOL 10 MG/ML IV BOLUS
INTRAVENOUS | Status: AC
  Filled 2023-10-05: qty 20

## 2023-10-05 MED ORDER — SODIUM CHLORIDE FLUSH 0.9 % IV SOLN
INTRAVENOUS | Status: AC
  Filled 2023-10-05: qty 20

## 2023-10-05 MED ORDER — 0.9 % SODIUM CHLORIDE (POUR BTL) OPTIME
TOPICAL | Status: DC | PRN
  Administered 2023-10-05: 500 mL

## 2023-10-05 MED ORDER — SENNA 8.6 MG PO TABS
ORAL_TABLET | ORAL | Status: AC
  Filled 2023-10-05: qty 1

## 2023-10-05 MED ORDER — DOCUSATE SODIUM 100 MG PO CAPS
100.0000 mg | ORAL_CAPSULE | Freq: Two times a day (BID) | ORAL | Status: DC
  Administered 2023-10-05 – 2023-10-06 (×2): 100 mg via ORAL

## 2023-10-05 MED ORDER — KETOROLAC TROMETHAMINE 15 MG/ML IJ SOLN
7.5000 mg | Freq: Four times a day (QID) | INTRAMUSCULAR | Status: AC
  Administered 2023-10-05 – 2023-10-06 (×4): 7.5 mg via INTRAVENOUS

## 2023-10-05 MED ORDER — EPHEDRINE SULFATE-NACL 50-0.9 MG/10ML-% IV SOSY
PREFILLED_SYRINGE | INTRAVENOUS | Status: DC | PRN
  Administered 2023-10-05: 5 mg via INTRAVENOUS

## 2023-10-05 MED ORDER — ATORVASTATIN CALCIUM 20 MG PO TABS
ORAL_TABLET | ORAL | Status: AC
Start: 2023-10-05 — End: ?
  Filled 2023-10-05: qty 1

## 2023-10-05 MED ORDER — PHENYLEPHRINE HCL-NACL 20-0.9 MG/250ML-% IV SOLN
INTRAVENOUS | Status: AC
  Filled 2023-10-05: qty 250

## 2023-10-05 MED ORDER — CHLORHEXIDINE GLUCONATE 0.12 % MT SOLN
OROMUCOSAL | Status: AC
  Filled 2023-10-05: qty 15

## 2023-10-05 MED ORDER — LIDOCAINE HCL (PF) 2 % IJ SOLN
INTRAMUSCULAR | Status: AC
  Filled 2023-10-05: qty 5

## 2023-10-05 MED ORDER — OXYCODONE HCL 5 MG PO TABS: 5.0000 mg | ORAL_TABLET | ORAL | Status: DC | PRN

## 2023-10-05 MED ORDER — DEXAMETHASONE SODIUM PHOSPHATE 10 MG/ML IJ SOLN
INTRAMUSCULAR | Status: AC
  Filled 2023-10-05: qty 1

## 2023-10-05 MED ORDER — CEFAZOLIN SODIUM-DEXTROSE 2-4 GM/100ML-% IV SOLN
INTRAVENOUS | Status: AC
  Filled 2023-10-05: qty 100

## 2023-10-05 MED ORDER — PROPOFOL 10 MG/ML IV BOLUS
INTRAVENOUS | Status: DC | PRN
  Administered 2023-10-05: 100 mg via INTRAVENOUS

## 2023-10-05 MED ORDER — HYDROCHLOROTHIAZIDE 12.5 MG PO TABS
12.5000 mg | ORAL_TABLET | Freq: Every day | ORAL | Status: DC
Start: 2023-10-06 — End: 2023-10-06
  Administered 2023-10-06: 12.5 mg via ORAL
  Filled 2023-10-05: qty 1

## 2023-10-05 MED ORDER — METHOCARBAMOL 500 MG PO TABS: 500.0000 mg | ORAL_TABLET | Freq: Four times a day (QID) | ORAL | Status: DC | PRN

## 2023-10-05 MED ORDER — OXYCODONE HCL 5 MG PO TABS: 5.0000 mg | ORAL_TABLET | Freq: Once | ORAL | Status: DC | PRN

## 2023-10-05 MED ORDER — SURGIFLO WITH THROMBIN (HEMOSTATIC MATRIX KIT) OPTIME
TOPICAL | Status: DC | PRN
  Administered 2023-10-05: 1 via TOPICAL

## 2023-10-05 MED ORDER — ORAL CARE MOUTH RINSE: 15.0000 mL | Freq: Once | OROMUCOSAL | Status: AC

## 2023-10-05 MED ORDER — SUCCINYLCHOLINE CHLORIDE 200 MG/10ML IV SOSY
PREFILLED_SYRINGE | INTRAVENOUS | Status: DC | PRN
  Administered 2023-10-05: 100 mg via INTRAVENOUS

## 2023-10-05 MED ORDER — CHLORHEXIDINE GLUCONATE 0.12 % MT SOLN
15.0000 mL | Freq: Once | OROMUCOSAL | Status: AC
Start: 2023-10-05 — End: 2023-10-05
  Administered 2023-10-05: 15 mL via OROMUCOSAL

## 2023-10-05 MED ORDER — SODIUM CHLORIDE 0.9% FLUSH: 3.0000 mL | INTRAVENOUS | Status: DC | PRN

## 2023-10-05 MED ORDER — DOCUSATE SODIUM 100 MG PO CAPS
ORAL_CAPSULE | ORAL | Status: AC
  Filled 2023-10-05: qty 1

## 2023-10-05 MED ORDER — MAGNESIUM CITRATE PO SOLN
1.0000 | Freq: Once | ORAL | Status: DC | PRN
Start: 2023-10-05 — End: 2023-10-06

## 2023-10-05 MED ORDER — SENNA 8.6 MG PO TABS
1.0000 | ORAL_TABLET | Freq: Two times a day (BID) | ORAL | Status: DC
  Administered 2023-10-05 – 2023-10-06 (×2): 8.6 mg via ORAL

## 2023-10-05 MED ORDER — TAMSULOSIN HCL 0.4 MG PO CAPS
ORAL_CAPSULE | ORAL | Status: AC
Start: 2023-10-05 — End: ?
  Filled 2023-10-05: qty 1

## 2023-10-05 MED ORDER — OXYCODONE HCL 5 MG/5ML PO SOLN: 5.0000 mg | Freq: Once | ORAL | Status: DC | PRN

## 2023-10-05 MED ORDER — ONDANSETRON HCL 4 MG/2ML IJ SOLN
INTRAMUSCULAR | Status: DC | PRN
  Administered 2023-10-05: 4 mg via INTRAVENOUS

## 2023-10-05 MED ORDER — ACETAMINOPHEN 650 MG RE SUPP: 650.0000 mg | RECTAL | Status: DC | PRN

## 2023-10-05 MED ORDER — ENOXAPARIN SODIUM 40 MG/0.4ML IJ SOSY
40.0000 mg | PREFILLED_SYRINGE | INTRAMUSCULAR | Status: DC
  Administered 2023-10-06: 40 mg via SUBCUTANEOUS

## 2023-10-05 MED ORDER — PHENYLEPHRINE HCL (PRESSORS) 10 MG/ML IV SOLN
INTRAVENOUS | Status: AC
  Filled 2023-10-05: qty 1

## 2023-10-05 MED ORDER — SUCCINYLCHOLINE CHLORIDE 200 MG/10ML IV SOSY
PREFILLED_SYRINGE | INTRAVENOUS | Status: AC
  Filled 2023-10-05: qty 10

## 2023-10-05 MED ORDER — METOPROLOL TARTRATE 25 MG PO TABS
ORAL_TABLET | ORAL | Status: AC
  Filled 2023-10-05: qty 1

## 2023-10-05 MED ORDER — PHENYLEPHRINE HCL-NACL 20-0.9 MG/250ML-% IV SOLN
INTRAVENOUS | Status: DC | PRN
  Administered 2023-10-05: 40 ug/min via INTRAVENOUS

## 2023-10-05 MED ORDER — DEXAMETHASONE SODIUM PHOSPHATE 10 MG/ML IJ SOLN
INTRAMUSCULAR | Status: DC | PRN
  Administered 2023-10-05: 10 mg via INTRAVENOUS

## 2023-10-05 MED ORDER — SODIUM CHLORIDE 0.9 % IV SOLN: 250.0000 mL | INTRAVENOUS | Status: DC

## 2023-10-05 MED ORDER — SODIUM CHLORIDE (PF) 0.9 % IJ SOLN
INTRAMUSCULAR | Status: DC | PRN
  Administered 2023-10-05: 60 mL

## 2023-10-05 MED ORDER — AMLODIPINE BESYLATE 5 MG PO TABS
ORAL_TABLET | ORAL | Status: AC
Start: 2023-10-05 — End: ?
  Filled 2023-10-05: qty 1

## 2023-10-05 MED ORDER — LIDOCAINE HCL (CARDIAC) PF 100 MG/5ML IV SOSY
PREFILLED_SYRINGE | INTRAVENOUS | Status: DC | PRN
  Administered 2023-10-05: 100 mg via INTRAVENOUS

## 2023-10-05 MED ORDER — GLYCOPYRROLATE 0.2 MG/ML IJ SOLN
INTRAMUSCULAR | Status: DC | PRN
  Administered 2023-10-05: .2 mg via INTRAVENOUS

## 2023-10-05 MED ORDER — MENTHOL 3 MG MT LOZG: 1.0000 | LOZENGE | OROMUCOSAL | Status: DC | PRN

## 2023-10-05 MED ORDER — FLUTICASONE PROPIONATE 50 MCG/ACT NA SUSP: 1.0000 | Freq: Every day | NASAL | Status: DC | PRN

## 2023-10-05 MED ORDER — OXYCODONE HCL 5 MG PO TABS: 10.0000 mg | ORAL_TABLET | ORAL | Status: DC | PRN

## 2023-10-05 MED ORDER — AMLODIPINE BESYLATE 10 MG PO TABS
5.0000 mg | ORAL_TABLET | Freq: Every evening | ORAL | Status: DC
  Administered 2023-10-05: 5 mg via ORAL

## 2023-10-05 MED ORDER — ACETAMINOPHEN 10 MG/ML IV SOLN
INTRAVENOUS | Status: AC
  Filled 2023-10-05: qty 100

## 2023-10-05 MED ORDER — METHYLPREDNISOLONE ACETATE 40 MG/ML IJ SUSP
INTRAMUSCULAR | Status: DC | PRN
  Administered 2023-10-05: 40 mg via INTRAMUSCULAR

## 2023-10-05 MED ORDER — ONDANSETRON HCL 4 MG PO TABS: 4.0000 mg | ORAL_TABLET | Freq: Four times a day (QID) | ORAL | Status: DC | PRN

## 2023-10-05 MED ORDER — SODIUM CHLORIDE 0.9% FLUSH
3.0000 mL | Freq: Two times a day (BID) | INTRAVENOUS | Status: DC
  Administered 2023-10-05 – 2023-10-06 (×2): 3 mL via INTRAVENOUS

## 2023-10-05 MED ORDER — ACETAMINOPHEN 10 MG/ML IV SOLN
INTRAVENOUS | Status: DC | PRN
  Administered 2023-10-05: 1000 mg via INTRAVENOUS

## 2023-10-05 MED ORDER — MORPHINE SULFATE (PF) 2 MG/ML IV SOLN: 1.0000 mg | INTRAVENOUS | Status: AC | PRN

## 2023-10-05 MED ORDER — TAMSULOSIN HCL 0.4 MG PO CAPS
0.4000 mg | ORAL_CAPSULE | Freq: Every day | ORAL | Status: DC
Start: 2023-10-05 — End: 2023-10-06
  Administered 2023-10-05 – 2023-10-06 (×2): 0.4 mg via ORAL

## 2023-10-05 MED ORDER — KETOROLAC TROMETHAMINE 15 MG/ML IJ SOLN
INTRAMUSCULAR | Status: AC
Start: 2023-10-05 — End: ?
  Filled 2023-10-05: qty 1

## 2023-10-05 MED ORDER — PHENOL 1.4 % MT LIQD: 1.0000 | OROMUCOSAL | Status: DC | PRN

## 2023-10-05 MED ORDER — FENTANYL CITRATE (PF) 100 MCG/2ML IJ SOLN
INTRAMUSCULAR | Status: DC | PRN
  Administered 2023-10-05 (×2): 25 ug via INTRAVENOUS
  Administered 2023-10-05: 50 ug via INTRAVENOUS

## 2023-10-05 MED ORDER — LACTATED RINGERS IV SOLN: INTRAVENOUS | Status: DC

## 2023-10-05 MED ORDER — METHYLPREDNISOLONE ACETATE 40 MG/ML IJ SUSP
INTRAMUSCULAR | Status: AC
  Filled 2023-10-05: qty 1

## 2023-10-05 MED ORDER — IRBESARTAN 150 MG PO TABS
300.0000 mg | ORAL_TABLET | Freq: Every day | ORAL | Status: DC
  Administered 2023-10-06: 300 mg via ORAL
  Filled 2023-10-05: qty 2

## 2023-10-05 MED ORDER — BUPIVACAINE-EPINEPHRINE (PF) 0.5% -1:200000 IJ SOLN
INTRAMUSCULAR | Status: DC | PRN
  Administered 2023-10-05: 2 mL

## 2023-10-05 MED ORDER — CEFAZOLIN SODIUM-DEXTROSE 2-4 GM/100ML-% IV SOLN
2.0000 g | Freq: Once | INTRAVENOUS | Status: AC
  Administered 2023-10-05: 2 g via INTRAVENOUS

## 2023-10-05 MED ORDER — ONDANSETRON HCL 4 MG/2ML IJ SOLN: 4.0000 mg | Freq: Four times a day (QID) | INTRAMUSCULAR | Status: DC | PRN

## 2023-10-05 MED ORDER — METOPROLOL TARTRATE 25 MG PO TABS
12.5000 mg | ORAL_TABLET | Freq: Two times a day (BID) | ORAL | Status: DC
  Administered 2023-10-05 – 2023-10-06 (×2): 12.5 mg via ORAL

## 2023-10-05 MED ORDER — ACETAMINOPHEN 500 MG PO TABS
ORAL_TABLET | ORAL | Status: AC
Start: 2023-10-05 — End: ?
  Filled 2023-10-05: qty 2

## 2023-10-05 SURGICAL SUPPLY — 32 items
BASIN KIT SINGLE STR (MISCELLANEOUS) ×1 IMPLANT
BUR NEURO DRILL SOFT 3.0X3.8M (BURR) ×1 IMPLANT
DERMABOND ADVANCED .7 DNX12 (GAUZE/BANDAGES/DRESSINGS) ×1 IMPLANT
DRAPE C ARM PK CFD 31 SPINE (DRAPES) ×1 IMPLANT
DRAPE LAPAROTOMY 100X77 ABD (DRAPES) ×1 IMPLANT
DRAPE MICROSCOPE SPINE 48X150 (DRAPES) ×1 IMPLANT
DRSG OPSITE POSTOP 3X4 (GAUZE/BANDAGES/DRESSINGS) ×1 IMPLANT
ELECT EZSTD 165MM 6.5IN (MISCELLANEOUS) ×1
ELECT REM PT RETURN 9FT ADLT (ELECTROSURGICAL) ×1
ELECTRODE EZSTD 165MM 6.5IN (MISCELLANEOUS) ×1 IMPLANT
ELECTRODE REM PT RTRN 9FT ADLT (ELECTROSURGICAL) ×1 IMPLANT
GLOVE BIOGEL PI IND STRL 6.5 (GLOVE) ×1 IMPLANT
GLOVE SURG SYN 6.5 ES PF (GLOVE) ×1 IMPLANT
GLOVE SURG SYN 6.5 PF PI (GLOVE) ×1 IMPLANT
GLOVE SURG SYN 8.5 E (GLOVE) ×3 IMPLANT
GLOVE SURG SYN 8.5 PF PI (GLOVE) ×3 IMPLANT
GOWN SRG LRG LVL 4 IMPRV REINF (GOWNS) ×1 IMPLANT
GOWN SRG XL LVL 3 NONREINFORCE (GOWNS) ×1 IMPLANT
KIT SPINAL PRONEVIEW (KITS) ×1 IMPLANT
MANIFOLD NEPTUNE II (INSTRUMENTS) ×1 IMPLANT
MARKER SKIN DUAL TIP RULER LAB (MISCELLANEOUS) ×1 IMPLANT
NDL SAFETY ECLIPSE 18X1.5 (NEEDLE) ×1 IMPLANT
NS IRRIG 500ML POUR BTL (IV SOLUTION) ×1 IMPLANT
PACK LAMINECTOMY ARMC (PACKS) ×1 IMPLANT
SURGIFLO W/THROMBIN 8M KIT (HEMOSTASIS) ×1 IMPLANT
SUT STRATA 3-0 15 PS-2 (SUTURE) ×1 IMPLANT
SUT VIC AB 0 CT1 27XCR 8 STRN (SUTURE) ×1 IMPLANT
SUT VIC AB 2-0 CT1 18 (SUTURE) ×1 IMPLANT
SYR 30ML LL (SYRINGE) ×2 IMPLANT
SYR 3ML LL SCALE MARK (SYRINGE) ×1 IMPLANT
TRAP FLUID SMOKE EVACUATOR (MISCELLANEOUS) ×1 IMPLANT
WATER STERILE IRR 500ML POUR (IV SOLUTION) IMPLANT

## 2023-10-05 NOTE — Anesthesia Procedure Notes (Signed)
 Procedure Name: Intubation Date/Time: 10/05/2023 9:42 AM  Performed by: Jackye Spanner, CRNAPre-anesthesia Checklist: Patient identified, Patient being monitored, Timeout performed, Emergency Drugs available and Suction available Patient Re-evaluated:Patient Re-evaluated prior to induction Oxygen Delivery Method: Circle system utilized Preoxygenation: Pre-oxygenation with 100% oxygen Induction Type: IV induction Ventilation: Mask ventilation without difficulty Laryngoscope Size: 3 and McGrath Grade View: Grade I Tube type: Oral Tube size: 7.0 mm Number of attempts: 1 Airway Equipment and Method: Stylet Placement Confirmation: ETT inserted through vocal cords under direct vision, positive ETCO2 and breath sounds checked- equal and bilateral Secured at: 22 cm Tube secured with: Tape Dental Injury: Teeth and Oropharynx as per pre-operative assessment  Comments: Smooth atraumatic intubation, no complications noted.

## 2023-10-05 NOTE — Discharge Summary (Signed)
 Discharge Summary  Patient ID: Andrew Richardson MRN: [id number] DOB/AGE: 84/84/1942 84 y.o.  Admit date: 10/05/2023 Discharge date: 10/06/2023  Admission Diagnoses: Lumbar radiculopathy  Discharge Diagnoses:  Principal Problem:   Lumbar radiculopathy   Discharged Condition: good  Hospital Course:  Andrew Richardson is a pleasant 83 y.o s/p left L5-S1 discectomy. His intraoperative course was uncomplicated. He was admitted for pain control and therapy evaluation. He was seen and evaluated by therapy and deemed appropriate for discharge home on POD1. He was given prescriptions for Oxycodone , Robaxin , and Senna to take as needed.   Consults: None  Significant Diagnostic Studies: none  Treatments: surgery: as above. Please see separately dictated operative report for further details   Discharge Exam: Blood pressure 124/66, pulse 63, temperature 98.7 F (37.1 C), temperature source Oral, resp. rate 18, height 5' 7 (1.702 m), weight 90.2 kg, SpO2 96%. AA Ox3 CNI   Strength:5/5 throughout right LE. RUE 5/5 HF and KE. 4- PF, 4+ DF and EHL  Disposition: Discharge disposition: 01-Home or Self Care       Discharge Instructions     Incentive spirometry RT   Complete by: As directed       Allergies as of 10/06/2023       Reactions   Simvastatin  Other (See Comments)   Fatigue and muscle pain/weakness   Tetracyclines & Related Diarrhea   Nitrofurantoin Rash        Medication List     TAKE these medications    amLODipine  5 MG tablet Commonly known as: NORVASC  Take 5 mg by mouth every evening.   aspirin  EC 81 MG tablet Take 81 mg by mouth daily. Swallow whole.   atorvastatin  10 MG tablet Commonly known as: LIPITOR  Take 10 mg by mouth every evening.   fluticasone  50 MCG/ACT nasal spray Commonly known as: FLONASE  Place 1 spray into both nostrils daily as needed for allergies or rhinitis.   levothyroxine  75 MCG tablet Commonly known as: SYNTHROID  Take 75 mcg by  mouth daily.   methocarbamol  500 MG tablet Commonly known as: ROBAXIN  Take 1 tablet (500 mg total) by mouth every 6 (six) hours as needed for muscle spasms.   metoprolol  tartrate 25 MG tablet Commonly known as: LOPRESSOR  Take 1 tablet (25 mg total) by mouth 2 (two) times daily. What changed: how much to take   oxyCODONE  5 MG immediate release tablet Commonly known as: Oxy IR/ROXICODONE  Take 1 tablet (5 mg total) by mouth every 4 (four) hours as needed for moderate pain (pain score 4-6).   senna 8.6 MG Tabs tablet Commonly known as: SENOKOT Take 1 tablet (8.6 mg total) by mouth 2 (two) times daily as needed for mild constipation.   tamsulosin  0.4 MG Caps capsule Commonly known as: FLOMAX  Take 0.4 mg by mouth daily.   telmisartan -hydrochlorothiazide  80-12.5 MG tablet Commonly known as: MICARDIS  HCT Take 1 tablet by mouth daily.        Follow-up Information     Hilma Hastings, PA-C Follow up on 10/20/2023.   Specialty: Neurosurgery Contact information: [address] [place] [address] [phone]                 Signed: Edsel Jama Goods 10/06/2023, 9:49 AM

## 2023-10-05 NOTE — Op Note (Signed)
 Indications: Mr. Andrew Richardson is suffering from lumbar radiculopathy. The patient tried and failed conservative management, prompting surgical intervention.  Findings: calcified and adherent disc herniation  Preoperative Diagnosis: Lumbar radiculopathy (ICD-10 M54.16) Postoperative Diagnosis: same   EBL: 25 ml IVF: see anesthesia record Drains: none Disposition: Extubated and Stable to PACU Complications: none  No foley catheter was placed.   Preoperative Note:   Risks of surgery discussed include: infection, bleeding, stroke, coma, death, paralysis, CSF leak, nerve/spinal cord injury, numbness, tingling, weakness, complex regional pain syndrome, recurrent stenosis and/or disc herniation, vascular injury, development of instability, neck/back pain, need for further surgery, persistent symptoms, development of deformity, and the risks of anesthesia. The patient understood these risks and agreed to proceed.  Operative Note:   1) Left L5/S1 microdiscectomy  The patient was then brought from the preoperative center with intravenous access established.  The patient underwent general anesthesia and endotracheal tube intubation, and was then rotated on the Denair rail top where all pressure points were appropriately padded.  The skin was then thoroughly cleansed.  Perioperative antibiotic prophylaxis was administered.  Sterile prep and drapes were then applied and a timeout was then observed.  C-arm was brought into the field under sterile conditions, and the L5-S1 disc space identified and marked with an incision on the left 1cm lateral to midline.  Once this was complete a 2 cm incision was opened with the use of a #10 blade knife.  The Metrx tubes were sequentially advanced under lateral fluoroscopy until a 18 x 70 mm Metrx tube was placed over the facet and lamina and secured to the bed.    The microscope was then sterilely brought into the field and muscle creep was hemostased with a  bipolar and resected with a pituitary rongeur.  A Bovie extender was then used to expose the spinous process and lamina.  Careful attention was placed to not violate the facet capsule. A 3 mm matchstick drill bit was then used to make a hemi-laminotomy trough until the ligamentum flavum was exposed.  This was extended to the base of the spinous process.  Once this was complete and the underlying ligamentum flavum was visualized, the ligamentum was dissected with an up angle curette and resected with a #2 and #3 mm biting Kerrison.  The laminotomy opening was also expanded in similar fashion and hemostasis was obtained with Surgifoam and a patty as well as bone wax.  The rostral aspect of the caudal level of the lamina was also resected with a #2 biting Kerrison effort to further enhance exposure.  Once the underlying dura was visualized a Penfield 4 was then used to dissect and expose the traversing nerve root.  Once this was identified a nerve root retractor suction was used to mobilize this medially.  The venous plexus was hemostased with Surgifoam and light bipolar use.  A small penfied was then used to make a small annulotomy within the disc space and disc space contents were noted to come through the annulus.    The disc herniation was identified and dissected free using a balltip probe. It was densely adherent to the ventral aspect of the S1 nerve root, requiring extensive dissection. The pituitary rongeur was used to remove the extruded disc fragments. Once the thecal sac and nerve root were noted to be relaxed and under less tension the ball-tipped feeler was passed along the foramen distally to ensure no residual compression was noted.    Depo-Medrol  was placed along the nerve root.  The area was irrigated. The tube system was then removed under microscopic visualization and hemostasis was obtained with a bipolar.    The fascial layer was reapproximated with the use of a 0- Vicryl suture.   Subcutaneous tissue layer was reapproximated using 2-0 Vicryl suture.  3-0 monocryl was used on the skin. The skin was then cleansed and Dermabond was used to close the skin opening.  Patient was then rotated back to the preoperative bed awakened from anesthesia and taken to recovery all counts are correct in this case.   I performed the entire procedure with the assistance of Edsel Goods PA as an designer, television/film set. An assistant was required for this procedure due to the complexity.  The assistant provided assistance in tissue manipulation and suction, and was required for the successful and safe performance of the procedure. I performed the critical portions of the procedure.   Reeves Daisy MD

## 2023-10-05 NOTE — Anesthesia Preprocedure Evaluation (Signed)
 Anesthesia Evaluation  Patient identified by MRN, date of birth, ID band Patient awake    Reviewed: Allergy & Precautions, NPO status , Patient's Chart, lab work & pertinent test results, reviewed documented beta blocker date and time   History of Anesthesia Complications Negative for: history of anesthetic complications  Airway Mallampati: II  TM Distance: >3 FB Neck ROM: Full    Dental  (+) Dental Advisory Given, Teeth Intact   Pulmonary neg pulmonary ROS, former smoker 10/05/2020 SARS coronavirus NEG   Pulmonary exam normal breath sounds clear to auscultation       Cardiovascular hypertension, Pt. on medications and Pt. on home beta blockers + CAD, + Past MI and + CABG  negative cardio ROS Normal cardiovascular exam Rhythm:Regular Rate:Normal  10/07/2020 ECHO: EF 55%, normal LVF, Grade 1 DD, normal RVF, no significant valvular abnormalities   Neuro/Psych  Headaches PSYCHIATRIC DISORDERS  Depression    negative neurological ROS  negative psych ROS   GI/Hepatic negative GI ROS, Neg liver ROS,,,  Endo/Other  negative endocrine ROSHypothyroidism    Renal/GU      Musculoskeletal   Abdominal   Peds  Hematology negative hematology ROS (+)   Anesthesia Other Findings Past Medical History: No date: Colon polyps No date: Coronary artery disease No date: 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 No date: Depression No date: Headache No date: Hyperlipemia No date: Hypertension No date: Hypothyroid No date: NSTEMI (non-ST elevated myocardial infarction) (HCC) No date: PAD (peripheral artery disease) (HCC) No date: Prediabetes 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) No date: Tubular adenoma of colon  Past Surgical History: No date: ANGIOPLASTY No date: COLONOSCOPY 05/20/2015: COLONOSCOPY WITH PROPOFOL ; N/A     Comment:  Procedure: COLONOSCOPY WITH PROPOFOL ;  Surgeon:  Gladis RAYMOND Mariner, MD;  Location: [facility] ENDOSCOPY;  Service:               Endoscopy;  Laterality: N/A; 10/08/2020: CORONARY ARTERY BYPASS GRAFT; N/A     Comment:  Procedure: CORONARY ARTERY BYPASS GRAFTING TIMES  FOUR                WITH LEFT INTERNAL MAMMARY ARTERY AND ENDOSCOPIC HARVEST               OF BILATERAL  LEG SAPHENOUS VEIN;  Surgeon: Shyrl Linnie KIDD, MD;  Location: [facility] OR;  Service: Open Heart               Surgery;  Laterality: N/A; 10/31/2015: INGUINAL HERNIA REPAIR; Left     Comment:  Procedure: HERNIA REPAIR INGUINAL ADULT;  Surgeon:               Larinda Unknown Sharps, MD;  Location: [facility] ORS;  Service:               General;  Laterality: Left; 10/06/2020: LEFT HEART CATH; N/A     Comment:  Procedure: Left Heart Cath and Coronary Angiography;                Surgeon: Ammon Blunt, MD;  Location: [facility]               INVASIVE CV LAB;  Service: Cardiovascular;  Laterality:               N/A; 1992: LEFT HEART CATH 10/08/2020: TEE WITHOUT  CARDIOVERSION; N/A     Comment:  Procedure: TRANSESOPHAGEAL ECHOCARDIOGRAM (TEE);                Surgeon: Shyrl Linnie KIDD, MD;  Location: [facility] OR;                Service: Open Heart Surgery;  Laterality: N/A;  BMI    Body Mass Index: 31.14 kg/m      Reproductive/Obstetrics negative OB ROS                             Anesthesia Physical Anesthesia Plan  ASA: 3  Anesthesia Plan: General ETT and General   Post-op Pain Management:    Induction: Intravenous  PONV Risk Score and Plan: 2 and Ondansetron  and Dexamethasone   Airway Management Planned: Oral ETT  Additional Equipment:   Intra-op Plan:   Post-operative Plan: Extubation in OR  Informed Consent: I have reviewed the patients History and Physical, chart, labs and discussed the procedure including the risks, benefits and alternatives for the proposed anesthesia with the patient or authorized representative  who has indicated his/her understanding and acceptance.     Dental Advisory Given  Plan Discussed with: Anesthesiologist, CRNA and Surgeon  Anesthesia Plan Comments: (Patient consented for risks of anesthesia including but not limited to:  - adverse reactions to medications - damage to eyes, teeth, lips or other oral mucosa - nerve damage due to positioning  - sore throat or hoarseness - Damage to heart, brain, nerves, lungs, other parts of body or loss of life  Patient voiced understanding and assent.)       Anesthesia Quick Evaluation

## 2023-10-05 NOTE — Plan of Care (Signed)
  Problem: Elimination: Goal: Will not experience complications related to urinary retention Outcome: Progressing   Problem: Pain Management: Goal: General experience of comfort will improve Outcome: Progressing   Problem: Safety: Goal: Ability to remain free from injury will improve Outcome: Progressing

## 2023-10-05 NOTE — Discharge Instructions (Signed)
 Your surgeon has performed an operation on your lumbar spine (low back) to relieve pressure on one or more nerves. Many times, patients feel better immediately after surgery and can "overdo it." Even if you feel well, it is important that you follow these activity guidelines. If you do not let your back heal properly from the surgery, you can increase the chance of a disc herniation and/or return of your symptoms. The following are instructions to help in your recovery once you have been discharged from the hospital.  * It is ok to take NSAIDs after surgery.  Activity    No bending, lifting, or twisting ("BLT"). Avoid lifting objects heavier than 10 pounds (gallon milk jug).  Where possible, avoid household activities that involve lifting, bending, pushing, or pulling such as laundry, vacuuming, grocery shopping, and childcare. Try to arrange for help from friends and family for these activities while your back heals.  Increase physical activity slowly as tolerated.  Taking short walks is encouraged, but avoid strenuous exercise. Do not jog, run, bicycle, lift weights, or participate in any other exercises unless specifically allowed by your doctor. Avoid prolonged sitting, including car rides.  Talk to your doctor before resuming sexual activity.  You should not drive until cleared by your doctor.  Until released by your doctor, you should not return to work or school.  You should rest at home and let your body heal.   You may shower three days after your surgery.  After showering, lightly dab your incision dry. Do not take a tub bath or go swimming for 3 weeks, or until approved by your doctor at your follow-up appointment.  If you smoke, we strongly recommend that you quit.  Smoking has been proven to interfere with normal healing in your back and will dramatically reduce the success rate of your surgery. Please contact [facility] (800-QUIT-NOW) and use the resources at www.[facility].com for  assistance in stopping smoking.  Surgical Incision   If you have a dressing on your incision, you may remove it three days after your surgery. Keep your incision area clean and dry.  If you have staples or stitches on your incision, you should have a follow up scheduled for removal. If you do not have staples or stitches, you will have steri-strips (small pieces of surgical tape) or Dermabond glue. The steri-strips/glue should begin to peel away within about a week (it is fine if the steri-strips fall off before then). If the strips are still in place one week after your surgery, you may gently remove them.  Diet            You may return to your usual diet. Be sure to stay hydrated.  When to Contact us  Although your surgery and recovery will likely be uneventful, you may have some residual numbness, aches, and pains in your back and/or legs. This is normal and should improve in the next few weeks.  However, should you experience any of the following, contact us immediately: New numbness or weakness Pain that is progressively getting worse, and is not relieved by your pain medications or rest Bleeding, redness, swelling, pain, or drainage from surgical incision Chills or flu-like symptoms Fever greater than 101.0 F (38.3 C) Problems with bowel or bladder functions Difficulty breathing or shortness of breath Warmth, tenderness, or swelling in your calf  Contact Information How to contact us:  If you have any questions/concerns before or after surgery, you can reach [place] at [phone], or you can  send a [employer]. We can be reached by phone or mychart 8am-4pm, Monday-Friday.  *Please note: Calls after 4pm are forwarded to a third party answering service. Mychart messages are not routinely monitored during evenings, weekends, and holidays. Please call our office to contact the answering service for urgent concerns during non-business hours.

## 2023-10-05 NOTE — Transfer of Care (Signed)
 Immediate Anesthesia Transfer of Care Note  Patient: Andrew Richardson  Procedure(s) Performed: LEFT L5-S1 MICRODISCECTOMY (Left: Spine Lumbar)  Patient Location: PACU  Anesthesia Type:General  Level of Consciousness: drowsy  Airway & Oxygen Therapy: Patient Spontanous Breathing and Patient connected to face mask oxygen  Post-op Assessment: Report given to RN and Post -op Vital signs reviewed and stable  Post vital signs: Reviewed and stable  Last Vitals:  Vitals Value Taken Time  BP 113/65 10/05/23 1145  Temp 36.3 C 10/05/23 1145  Pulse 52 10/05/23 1146  Resp 19 10/05/23 1146  SpO2 99 % 10/05/23 1146  Vitals shown include unfiled device data.  Last Pain:  Vitals:   10/05/23 0806  TempSrc: Temporal  PainSc: 0-No pain         Complications: No notable events documented.

## 2023-10-05 NOTE — H&P (Signed)
 Referring Physician:  Clois Fret, MD [address] [place],  [place] [address]  Primary Physician:  Diedra Lame, MD  History of Present Illness: 10/05/2023 Andrew Richardson presents today with continued symptoms.  09/01/2023 Dr. Charmaine returns to discuss his symptoms.  He has continued left leg weakness.  07/14/2023 Andrew Richardson is here today with a chief complaint of progressive difficulty in walking. He reports a weakness in the legs that has been present for approximately eight years, with a notable 'foot flop' on the left side. The patient denies significant pain but describes a sensation of discomfort and fatigue in both legs, particularly after periods of standing or walking. This discomfort often necessitates multiple rest stops during ambulation, even over short distances.  He denies any current pain, numbness, or tingling in the legs. The patient has been under the care of two other physicians for the past eight years, discussing this issue.  The patient's primary concern is the progressive weakness in the legs, which has significantly impacted his ability to perform daily activities, such as working in his large yard. He denies any significant pain, numbness, or tingling in the legs. The patient has undergone physical therapy in the past, but it did not yield significant improvement. He has also had his blood vessels checked, which reportedly showed good blood flow.   Bowel/Bladder Dysfunction: none  Conservative measures:  Physical therapy:  has participated in at [facility] from 05/04/23 to 06/15/23 and was discharged Multimodal medical therapy including regular antiinflammatories:  tylenol , gabapentin  Injections:  has received epidural steroid injections 06/07/2023: Bilateral S1 transforaminal ESI(40% relief)   Past Surgery: no previous spinal surgeries   Andrew Richardson has no symptoms of cervical myelopathy.  The symptoms  are causing a significant impact on the patient's life.   I have utilized the care everywhere function in epic to review the outside records available from external health systems.  Review of Systems:  A 10 point review of systems is negative, except for the pertinent positives and negatives detailed in the HPI.  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    Depression    Headache    Hyperlipemia    Hypertension    Hypothyroid    NSTEMI (non-ST elevated myocardial infarction) (HCC)    PAD (peripheral artery disease) (HCC)    Prediabetes    Transitional cell carcinoma of bladder (HCC)    Tubular adenoma of colon     Past Surgical History: Past Surgical History:  Procedure Laterality Date   ANGIOPLASTY     COLONOSCOPY     COLONOSCOPY WITH PROPOFOL  N/A 05/20/2015   Procedure: COLONOSCOPY WITH PROPOFOL ;  Surgeon: Gladis RAYMOND Mariner, MD;  Location: [facility] ENDOSCOPY;  Service: Endoscopy;  Laterality: N/A;   CORONARY ARTERY BYPASS GRAFT N/A 10/08/2020   Procedure: CORONARY ARTERY BYPASS GRAFTING TIMES  FOUR  WITH LEFT INTERNAL MAMMARY ARTERY AND ENDOSCOPIC HARVEST OF BILATERAL  LEG SAPHENOUS VEIN;  Surgeon: Shyrl Linnie KIDD, MD;  Location: [facility] OR;  Service: Open Heart Surgery;  Laterality: N/A;   INGUINAL HERNIA REPAIR Left 10/31/2015   Procedure: HERNIA REPAIR INGUINAL ADULT;  Surgeon: Larinda Unknown Sharps, MD;  Location: [facility] ORS;  Service: General;  Laterality: Left;   LEFT HEART CATH N/A 10/06/2020   Procedure: Left Heart Cath and Coronary Angiography;  Surgeon: Ammon Blunt,  MD;  Location: [facility] INVASIVE CV LAB;  Service: Cardiovascular;  Laterality: N/A;   LEFT HEART CATH  1992   TEE WITHOUT CARDIOVERSION N/A 10/08/2020   Procedure: TRANSESOPHAGEAL ECHOCARDIOGRAM (TEE);  Surgeon: Shyrl Linnie KIDD, MD;  Location: [facility] OR;  Service: Open  Heart Surgery;  Laterality: N/A;    Allergies: Allergies as of 09/01/2023 - Review Complete 09/01/2023  Allergen Reaction Noted   Simvastatin  Other (See Comments) 04/13/2018   Tetracyclines & related Diarrhea 05/19/2015   Nitrofurantoin Rash 10/23/2015    Medications:  Current Facility-Administered Medications:    ceFAZolin  (ANCEF ) IVPB 2g/100 mL premix, 2 g, Intravenous, Once, Clois Fret, MD   lactated ringers  infusion, , Intravenous, Continuous, Andrew Fess, MD  Social History: Social History   Tobacco Use   Smoking status: Former    Current packs/day: 0.00    Types: Cigarettes    Quit date: 05/19/1989    Years since quitting: 34.4   Smokeless tobacco: Former  Building Services Engineer status: Never Used  Substance Use Topics   Alcohol use: Yes    Alcohol/week: 3.0 standard drinks of alcohol    Types: 3 Shots of liquor per week   Drug use: No    Family Medical History: Family History  Problem Relation Age of Onset   Rheumatic fever Mother     Physical Examination: Vitals:   10/05/23 0806  BP: (!) 168/70  Pulse: 62  Resp: 16  Temp: (!) 97.3 F (36.3 C)  SpO2: 96%   Heart sounds normal no MRG. Chest Clear to Auscultation Bilaterally.  General: Patient is in no apparent distress. Attention to examination is appropriate.  Neck:   Supple.  Full range of motion.  Respiratory: Patient is breathing without any difficulty.   NEUROLOGICAL:     Awake, alert, oriented to person, place, and time.  Speech is clear and fluent.   Cranial Nerves: Pupils equal round and reactive to light.  Facial tone is symmetric.  Facial sensation is symmetric. Shoulder shrug is symmetric. Tongue protrusion is midline.  There is no pronator drift.  Strength: Side Biceps Triceps Deltoid Interossei Grip Wrist Ext. Wrist Flex.  R 5 5 5 5 5 5 5   L 5 5 5 5 5 5 5    Side Iliopsoas Quads Hamstring PF DF EHL  R 5 5 5 5 5 5   L 5 5 4+ 4- 4+ 4+   Reflexes are 1+ and symmetric at the  biceps, triceps, brachioradialis, patella and achilles.   Hoffman's is absent.   Bilateral upper and lower extremity sensation is intact to light touch.    No evidence of dysmetria noted.  Gait is stooped.     Medical Decision Making  Imaging: MRI L spine 03/10/2023 Disc levels:   Disc spaces: Degenerative disease with disc height loss at T11-12, T12-L1, L1-2, L3-4, L4-5 and L5-S1.   T11-12: Mild broad-based disc bulge. No foraminal or central canal stenosis.   T12-L1: Mild broad-based disc bulge. Moderate bilateral facet arthropathy. No foraminal or central canal stenosis.   L1-L2: Mild broad-based disc bulge. Mild bilateral facet arthropathy. No spinal stenosis. Mild left foraminal stenosis. No right foraminal stenosis. Left lateral recess stenosis.   L2-L3: Broad-based disc bulge. Mild bilateral facet arthropathy. Mild spinal stenosis. Bilateral lateral recess stenosis. Mild left foraminal stenosis. No right foraminal stenosis.   L3-L4: Broad-based disc bulge. Mild bilateral facet arthropathy. Mild spinal stenosis. Mild-moderate bilateral foraminal stenosis.   L4-L5: Mild broad-based disc bulge. Moderate right and mild left  facet arthropathy. Mild right foraminal stenosis. No left foraminal stenosis. No spinal stenosis.   L5-S1: Broad-based disc bulge with a left paracentral disc extrusion and mass effect on the left intraspinal S1 nerve root. Right subarticular recess stenosis. Moderate spinal stenosis. Moderate-severe bilateral foraminal stenosis.   IMPRESSION: 1. At L5-S1 there is a broad-based disc bulge with a left paracentral disc extrusion and mass effect on the left intraspinal S1 nerve root. Right subarticular recess stenosis. Moderate spinal stenosis. Moderate-severe bilateral foraminal stenosis. 2. Diffuse lumbar spine spondylosis as described above. 3. No acute osseous injury of the lumbar spine.     Electronically Signed   By: Julaine Blanch M.D.    On: 03/19/2023 06:40  I have personally reviewed the images and agree with the above interpretation.  Assessment and Plan: Mr. Febles is a pleasant 83 y.o. 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  He has weakness in his left S1 mediated muscles.  We will plan for left L5/S1 discectomy.     Taison Celani K. Clois MD, [facility] Neurosurgery

## 2023-10-05 NOTE — Anesthesia Postprocedure Evaluation (Signed)
 Anesthesia Post Note  Patient: Andrew Richardson  Procedure(s) Performed: LEFT L5-S1 MICRODISCECTOMY (Left: Spine Lumbar)  Patient location during evaluation: PACU Anesthesia Type: General Level of consciousness: awake and alert Pain management: pain level controlled Vital Signs Assessment: post-procedure vital signs reviewed and stable Respiratory status: spontaneous breathing, nonlabored ventilation, respiratory function stable and patient connected to nasal cannula oxygen Cardiovascular status: blood pressure returned to baseline and stable Postop Assessment: no apparent nausea or vomiting Anesthetic complications: no  No notable events documented.   Last Vitals:  Vitals:   10/05/23 0806 10/05/23 1145  BP: (!) 168/70 113/65  Pulse: 62 (!) 51  Resp: 16 (!) 6  Temp: (!) 36.3 C (!) 36.3 C  SpO2: 96% 99%    Last Pain:  Vitals:   10/05/23 1145  TempSrc:   PainSc: Asleep                 [name]

## 2023-10-06 ENCOUNTER — Encounter: Payer: Self-pay | Admitting: Neurosurgery

## 2023-10-06 DIAGNOSIS — M5117 Intervertebral disc disorders with radiculopathy, lumbosacral region: Secondary | ICD-10-CM | POA: Diagnosis not present

## 2023-10-06 MED ORDER — ENOXAPARIN SODIUM 40 MG/0.4ML IJ SOSY
PREFILLED_SYRINGE | INTRAMUSCULAR | Status: AC
Start: 2023-10-06 — End: ?
  Filled 2023-10-06: qty 0.4

## 2023-10-06 MED ORDER — TAMSULOSIN HCL 0.4 MG PO CAPS
ORAL_CAPSULE | ORAL | Status: AC
  Filled 2023-10-06: qty 1

## 2023-10-06 MED ORDER — ACETAMINOPHEN 500 MG PO TABS
ORAL_TABLET | ORAL | Status: AC
  Filled 2023-10-06: qty 2

## 2023-10-06 MED ORDER — METOPROLOL TARTRATE 25 MG PO TABS
ORAL_TABLET | ORAL | Status: AC
Start: 2023-10-06 — End: ?
  Filled 2023-10-06: qty 1

## 2023-10-06 MED ORDER — SENNA 8.6 MG PO TABS
ORAL_TABLET | ORAL | Status: AC
  Filled 2023-10-06: qty 1

## 2023-10-06 MED ORDER — OXYCODONE HCL 5 MG PO TABS: 5.0000 mg | ORAL_TABLET | ORAL | 0 refills | Status: DC | PRN

## 2023-10-06 MED ORDER — SENNA 8.6 MG PO TABS: 1.0000 | ORAL_TABLET | Freq: Two times a day (BID) | ORAL | 0 refills | Status: DC | PRN

## 2023-10-06 MED ORDER — DOCUSATE SODIUM 100 MG PO CAPS
ORAL_CAPSULE | ORAL | Status: AC
  Filled 2023-10-06: qty 1

## 2023-10-06 MED ORDER — METHOCARBAMOL 500 MG PO TABS: 500.0000 mg | ORAL_TABLET | Freq: Four times a day (QID) | ORAL | 0 refills | Status: DC | PRN

## 2023-10-06 MED ORDER — KETOROLAC TROMETHAMINE 15 MG/ML IJ SOLN
INTRAMUSCULAR | Status: AC
  Filled 2023-10-06: qty 1

## 2023-10-06 NOTE — Plan of Care (Signed)
  Problem: Clinical Measurements: Goal: Respiratory complications will improve Outcome: Progressing   Problem: Activity: Goal: Risk for activity intolerance will decrease Outcome: Progressing   Problem: Coping: Goal: Level of anxiety will decrease Outcome: Progressing   Problem: Elimination: Goal: Will not experience complications related to urinary retention Outcome: Progressing   Problem: Pain Management: Goal: General experience of comfort will improve Outcome: Progressing   Problem: Pain Management: Goal: Pain level will decrease Outcome: Progressing

## 2023-10-06 NOTE — Plan of Care (Signed)
  Problem: Elimination: Goal: Will not experience complications related to urinary retention Outcome: Progressing   Problem: Pain Management: Goal: General experience of comfort will improve Outcome: Progressing   Problem: Safety: Goal: Ability to remain free from injury will improve Outcome: Progressing

## 2023-10-06 NOTE — Evaluation (Signed)
 Physical Therapy Evaluation Patient Details Name: Andrew Richardson MRN: [id number] DOB: 09/09/1941 Today's Date: 10/06/2023  History of Present Illness  83 y.o presenting with left sided lumbar radiculopathy and weakness s/p left L5-S1 microdisectomy.  Clinical Impression  Pt very pleasant and interactive t/o PT eval, he showed good confidence and safety with mobility and gait.  He was able to easily and confidently negotiate steps and overall did very well.  Pt with minimal pain, functional strength and no overt safety concerns.  Will benefit from continued PT per discectomy protocols and surgeon recommendations.        If plan is discharge home, recommend the following: Assistance with cooking/housework;Assist for transportation;Help with stairs or ramp for entrance   Can travel by private vehicle        Equipment Recommendations Rolling walker (2 wheels)  Recommendations for Other Services       Functional Status Assessment Patient has had a recent decline in their functional status and demonstrates the ability to make significant improvements in function in a reasonable and predictable amount of time.     Precautions / Restrictions Precautions Precautions: Fall;Back Restrictions Weight Bearing Restrictions Per Provider Order: No      Mobility  Bed Mobility                    Transfers Overall transfer level: Modified independent Equipment used: None Transfers: Sit to/from Stand Sit to Stand: Contact guard assist           General transfer comment: able to rise with and w/o AD, minimal reliance on UEs    Ambulation/Gait Ambulation/Gait assistance: Supervision Gait Distance (Feet): 200 Feet Assistive device: Rolling walker (2 wheels), None         General Gait Details: Pt with good confidence, able to ambulate with and w/o AD with relative ease.  He did not have any issues with LOBs, bukcling, etc and showed good confidence t/o the effort.   Education  for appropriate use of walker during intial recovery period.  Stairs Stairs: Yes Stairs assistance: Modified independent (Device/Increase time) Stair Management: One rail Right, Alternating pattern Number of Stairs: 4 General stair comments: pt able to negotiate steps with reciprocal pattern w/o hesitation  Wheelchair Mobility     Tilt Bed    Modified Rankin (Stroke Patients Only)       Balance Overall balance assessment: Needs assistance Sitting-balance support: Feet supported Sitting balance-Leahy Scale: Good     Standing balance support: Bilateral upper extremity supported Standing balance-Leahy Scale: Good Standing balance comment: able to ambulate with and w/o AD with similar speed/cadence.  Education for safety considerations especially as he is starting to do more during recovery.                             Pertinent Vitals/Pain Pain Assessment Pain Score: 1  Pain Location: dull LBP    Home Living Family/patient expects to be discharged to:: Private residence Living Arrangements: Spouse/significant other Available Help at Discharge: Family;Friend(s) Type of Home: House Home Access: Stairs to enter Entrance Stairs-Rails: Right Entrance Stairs-Number of Steps: 4 steps back porch- no rails Alternate Level Stairs-Number of Steps: 12 Home Layout: Multi-level Home Equipment: Control And Instrumentation Engineer (2 wheels);Standard Walker Additional Comments: has a bathroom with a bath and a grab bar near toilet on second floor in addition to master bath- pt reports he will use a combination of them both    Prior  Function Prior Level of Function : Independent/Modified Independent             Mobility Comments: used cart in grocery store, amb with no AD, no falls but frequent stumbles ADLs Comments: MOD I- I in ADL     Extremity/Trunk Assessment   Upper Extremity Assessment Upper Extremity Assessment: Overall WFL for tasks assessed    Lower  Extremity Assessment Lower Extremity Assessment: Overall WFL for tasks assessed LLE Deficits / Details: mild L PF defectis but functional and near equal t/o otherwise    Cervical / Trunk Assessment Cervical / Trunk Assessment: Back Surgery  Communication   Communication Communication: No apparent difficulties  Cognition Arousal: Alert Behavior During Therapy: WFL for tasks assessed/performed Overall Cognitive Status: Within Functional Limits for tasks assessed                                          General Comments General comments (skin integrity, edema, etc.): vss throughout    Exercises     Assessment/Plan    PT Assessment Patient needs continued PT services  PT Problem List Decreased strength;Decreased range of motion;Decreased activity tolerance;Decreased balance;Decreased mobility;Decreased knowledge of use of DME;Decreased safety awareness;Decreased knowledge of precautions;Pain       PT Treatment Interventions DME instruction;Gait training;Stair training;Functional mobility training;Therapeutic activities;Therapeutic exercise;Balance training;Neuromuscular re-education;Patient/family education    PT Goals (Current goals can be found in the Care Plan section)  Acute Rehab PT Goals Patient Stated Goal: Go home PT Goal Formulation: With patient Time For Goal Achievement: 10/19/23 Potential to Achieve Goals: Good    Frequency 7X/week     Co-evaluation               AM-PAC PT 6 Clicks Mobility  Outcome Measure Help needed turning from your back to your side while in a flat bed without using bedrails?: None Help needed moving from lying on your back to sitting on the side of a flat bed without using bedrails?: None Help needed moving to and from a bed to a chair (including a wheelchair)?: None Help needed standing up from a chair using your arms (e.g., wheelchair or bedside chair)?: None Help needed to walk in hospital room?: None Help  needed climbing 3-5 steps with a railing? : A Little 6 Click Score: 23    End of Session Equipment Utilized During Treatment: Gait belt Activity Tolerance: Patient tolerated treatment well Patient left: with call bell/phone within reach;with chair alarm set Nurse Communication: Mobility status PT Visit Diagnosis: Muscle weakness (generalized) (M62.81);Difficulty in walking, not elsewhere classified (R26.2)    Time: 9066-9049 PT Time Calculation (min) (ACUTE ONLY): 17 min   Charges:   PT Evaluation $PT Eval Low Complexity: 1 Low   PT General Charges $$ ACUTE PT VISIT: 1 Visit         Carmin JONELLE Deed, DPT 10/06/2023, 11:00 AM

## 2023-10-06 NOTE — Evaluation (Signed)
 Occupational Therapy Evaluation Patient Details Name: Andrew Richardson MRN: [id number] DOB: 1940-12-18 Today's Date: 10/06/2023   History of Present Illness 83 y.o presenting with left sided lumbar radiculopathy and weakness s/p left L5-S1 microdisectomy.   Clinical Impression   Pt seen for OT evaluation this date, alert and oriented x4, agreeable to evaluation. PTA pt is MOD I-I in ADL/IADL, amb with no AD but reports frequent stumbles. Pt provide education via demo and hand out re: self care skills, AE, and home/routines modifications to maximize safety and functional independence while minimizing falls risk and maintaining precautions. Pt verbalized understanding of all education/training provided. Able to return demonstration safe techniques while maintaining precautions throughout. Handout provided to support recall and carry over of learned precautions/techniques for bed mobility, functional transfers, and self care skills. Amb in hallway with HHA via MIN A, LB dressing with supervision with use of AE, MIN A with no AE. Pt is left in chair, all needs met. OT will follow acutely.     If plan is discharge home, recommend the following: Assistance with cooking/housework;Help with stairs or ramp for entrance    Functional Status Assessment  Patient has had a recent decline in their functional status and demonstrates the ability to make significant improvements in function in a reasonable and predictable amount of time.  Equipment Recommendations  None recommended by OT;Other (comment) (pt has bsc already)    Recommendations for Other Services       Precautions / Restrictions Precautions Precautions: Fall;Back Restrictions Weight Bearing Restrictions Per Provider Order: No      Mobility Bed Mobility Overal bed mobility: Needs Assistance Bed Mobility: Rolling, Sidelying to Sit Rolling: Supervision Sidelying to sit: Supervision       General bed mobility comments: bed flat to  simulate home set up, intermittent vcs for technique    Transfers Overall transfer level: Needs assistance Equipment used: None Transfers: Sit to/from Stand Sit to Stand: Contact guard assist                  Balance Overall balance assessment: Needs assistance Sitting-balance support: Feet supported Sitting balance-Leahy Scale: Good     Standing balance support: Single extremity supported Standing balance-Leahy Scale: Fair                             ADL either performed or assessed with clinical judgement   ADL Overall ADL's : Needs assistance/impaired Eating/Feeding: Set up;Sitting   Grooming: Sitting;Set up           Upper Body Dressing : Set up;Sitting   Lower Body Dressing: Sitting/lateral leans;Minimal assistance;supervision  Lower Body Dressing Details (indicate cue type and reason): underwear, pants, socks; frequent verbal cues for use of AE vs figure 4 to adhere to precautions; SET UP with reacher Toilet Transfer: Minimal assistance Toilet Transfer Details (indicate cue type and reason): via HHA Toileting- Clothing Manipulation and Hygiene: Supervision/safety;Sitting/lateral lean Toileting - Clothing Manipulation Details (indicate cue type and reason): anticipate     Functional mobility during ADLs: Contact guard assist;Minimal assistance (approx 30' via HHA)       Vision Baseline Vision/History: 1 Wears glasses Patient Visual Report: No change from baseline       Perception         Praxis         Pertinent Vitals/Pain Pain Assessment Pain Assessment: 0-10 Pain Score: 1  Pain Location: dull LBP Pain Descriptors / Indicators: Discomfort, Dull Pain  Intervention(s): Monitored during session, Repositioned     Extremity/Trunk Assessment Upper Extremity Assessment Upper Extremity Assessment: Overall WFL for tasks assessed   Lower Extremity Assessment Lower Extremity Assessment: LLE deficits/detail;Defer to PT evaluation LLE  Deficits / Details: pt reports continued mild weakness in LLE   Cervical / Trunk Assessment Cervical / Trunk Assessment: Back Surgery   Communication Communication Communication: No apparent difficulties   Cognition Arousal: Alert Behavior During Therapy: WFL for tasks assessed/performed Overall Cognitive Status: Within Functional Limits for tasks assessed                                       General Comments  vss throughout    Exercises     Shoulder Instructions      Home Living Family/patient expects to be discharged to:: Private residence Living Arrangements: Spouse/significant other Available Help at Discharge: Family;Friend(s) (wife had hip surgery 3 months ago) Type of Home: House Home Access: Stairs to enter [employer] of Steps: 4 steps back porch- no rails   Home Layout: Multi-level Alternate Level Stairs-Number of Steps: 12 Alternate Level Stairs-Rails: Left Bathroom Shower/Tub: Chief Strategy Officer: Handicapped height (in child psychotherapist, has bsc for other toilet if needed)     Home Equipment: Control And Instrumentation Engineer (2 wheels)   Additional Comments: has a bathroom with a bath and a grab bar near toilet on second floor in addition to master bath- pt reports he will use a combination of them both      Prior Functioning/Environment Prior Level of Function : Independent/Modified Independent             Mobility Comments: used cart in grocery store, amb with no AD, no falls but frequent stumbles ADLs Comments: MOD I- I in ADL        OT Problem List: Decreased strength;Decreased activity tolerance;Decreased knowledge of use of DME or AE      OT Treatment/Interventions: Self-care/ADL training;Therapeutic exercise;Patient/family education;Therapeutic activities;DME and/or AE instruction    OT Goals(Current goals can be found in the care plan section) Acute Rehab OT Goals Patient Stated Goal: go home OT Goal  Formulation: With patient Time For Goal Achievement: 10/20/23 Potential to Achieve Goals: Good ADL Goals Pt Will Perform Grooming: with modified independence;standing Pt Will Perform Lower Body Dressing: with modified independence;sit to/from stand;sitting/lateral leans Pt Will Transfer to Toilet: with modified independence;ambulating Pt Will Perform Toileting - Clothing Manipulation and hygiene: with modified independence;sitting/lateral leans;sit to/from stand  OT Frequency: Min 1X/week    Co-evaluation              AM-PAC OT 6 Clicks Daily Activity     Outcome Measure Help from another person eating meals?: None Help from another person taking care of personal grooming?: None Help from another person toileting, which includes using toliet, bedpan, or urinal?: None Help from another person bathing (including washing, rinsing, drying)?: A Little Help from another person to put on and taking off regular upper body clothing?: None Help from another person to put on and taking off regular lower body clothing?: A Little 6 Click Score: 22   End of Session Equipment Utilized During Treatment: Gait belt Nurse Communication: Mobility status  Activity Tolerance: Patient tolerated treatment well Patient left: in chair;with call bell/phone within reach  OT Visit Diagnosis: Other abnormalities of gait and mobility (R26.89)  Time: 9144-9084 OT Time Calculation (min): 20 min Charges:  OT General Charges $OT Visit: 1 Visit OT Evaluation $OT Eval Low Complexity: 1 Low  Therisa Sheffield, OTD OTR/L  10/06/23, 10:33 AM

## 2023-10-06 NOTE — Progress Notes (Signed)
DISCHARGE NOTE:  Pt given discharge instructions and verbalized understanding. Pt wheeled to car by staff, wife providing transportation home.

## 2023-10-06 NOTE — Progress Notes (Signed)
   Neurosurgery Progress Note  History: Andrew Richardson is s/p left L5-S1 microdiscectomy  POD1: patient doing well this morning   Physical Exam: Vitals:   10/06/23 0439 10/06/23 0741  BP: 128/70 117/61  Pulse: 65 (!) 57  Resp: 18 18  Temp: 98.6 F (37 C) 98.7 F (37.1 C)  SpO2: 97% 97%    AA Ox3 CNI  Strength:5/5 throughout right LE. RUE 5/5 HF and KE. 4- PF, 4+ DF and EHL  Data:  Other tests/results: NA   Assessment/Plan:  Andrew Richardson is a 83 y.o presenting with left sided lumbar radiculopathy and weakness s/p left L5-S1 microdisectomy.   - mobilize - pain control - DVT prophylaxis - PTOT; dispo planning underway  Edsel Goods PA-C Department of Neurosurgery

## 2023-10-07 ENCOUNTER — Telehealth: Payer: Self-pay | Admitting: Neurosurgery

## 2023-10-07 DIAGNOSIS — M5126 Other intervertebral disc displacement, lumbar region: Secondary | ICD-10-CM

## 2023-10-07 MED ORDER — SENNA 8.6 MG PO TABS: 1.0000 | ORAL_TABLET | Freq: Two times a day (BID) | ORAL | 0 refills | Status: DC | PRN

## 2023-10-07 MED ORDER — METHOCARBAMOL 500 MG PO TABS: 500.0000 mg | ORAL_TABLET | Freq: Four times a day (QID) | ORAL | 0 refills | Status: DC | PRN

## 2023-10-07 MED ORDER — OXYCODONE HCL 5 MG PO TABS: 5.0000 mg | ORAL_TABLET | ORAL | 0 refills | Status: DC | PRN

## 2023-10-07 NOTE — Telephone Encounter (Signed)
 Spoke to patient and he has not been able to pick up the Oxycodone and Senakot. Could you please re send those to the [facility] pharmacy?

## 2023-10-07 NOTE — Telephone Encounter (Addendum)
 I was on hold with [facility] for 55 minutes and I was not been able to reach anyone and they hung up on me.

## 2023-10-07 NOTE — Telephone Encounter (Signed)
  Media Information   Document Information  Can you follow up on this please.

## 2023-10-07 NOTE — Telephone Encounter (Signed)
 I spoke to [facility] and they are filling the Oxy and Senakot now for the patient.

## 2023-10-07 NOTE — Telephone Encounter (Signed)
 Tried speaking to the pharmacist multiple times and they would not pick up the phone. I attempted to reach the patient and left a message asking if the patient was able to pick up the medication.

## 2023-10-14 NOTE — Progress Notes (Unsigned)
   REFERRING PHYSICIAN:  Kandyce Rud, Md [address] [facility] [place],  [place] [address]  DOS: 10/05/23 left L5-S1 discectomy  HISTORY OF PRESENT ILLNESS: Andrew Richardson is approximately 2 weeks status post above surgery. Was given oxycodone and robaxin on discharge from the hospital.   He has no current pain. He feels like his walking has improved since his surgery. He still notes some weakness in left foot, but thinks it may be better.   He is not taking oxycodone or robaxin.    PHYSICAL EXAMINATION:  General: Patient is well developed, well nourished, calm, collected, and in no apparent distress.   NEUROLOGICAL:  General: In no acute distress.   Awake, alert, oriented to person, place, and time.  Pupils equal round and reactive to light.  Facial tone is symmetric.     Strength:           Side Iliopsoas Quads Hamstring PF DF EHL  R 5 5 5 5 5 5   L 5 5 5  4+ 4+ 4+    Incision c/d/I  He is able to left left heel off the ground to stand on toes.    ROS (Neurologic):  Negative except as noted above  IMAGING: Nothing new to review.   ASSESSMENT/PLAN:  Andrew Richardson is doing well s/p above surgery. Treatment options reviewed with patient and following plan made:   - I have advised the patient to lift up to 10 pounds until 6 weeks after surgery (follow up with Dr. Myer Haff).  - Reviewed wound care.  - No bending, twisting, or lifting.  - Follow up as scheduled in 4 weeks and prn.   Advised to contact the office if any questions or concerns arise.  Andrew Leach PA-C Department of neurosurgery

## 2023-10-18 ENCOUNTER — Encounter: Payer: Self-pay | Admitting: Neurosurgery

## 2023-10-20 ENCOUNTER — Ambulatory Visit: Payer: Medicare Other | Admitting: Orthopedic Surgery

## 2023-10-20 ENCOUNTER — Encounter: Payer: Self-pay | Admitting: Orthopedic Surgery

## 2023-10-20 VITALS — BP 134/82 | Temp 98.0°F | Ht 67.0 in | Wt 198.0 lb

## 2023-10-20 DIAGNOSIS — Z9889 Other specified postprocedural states: Secondary | ICD-10-CM

## 2023-10-20 DIAGNOSIS — M5416 Radiculopathy, lumbar region: Secondary | ICD-10-CM

## 2023-11-15 ENCOUNTER — Ambulatory Visit (INDEPENDENT_AMBULATORY_CARE_PROVIDER_SITE_OTHER): Payer: Medicare Other | Admitting: Neurosurgery

## 2023-11-15 ENCOUNTER — Encounter: Payer: Self-pay | Admitting: Neurosurgery

## 2023-11-15 VITALS — BP 142/80 | Temp 97.6°F | Ht 67.0 in | Wt 198.0 lb

## 2023-11-15 DIAGNOSIS — M5416 Radiculopathy, lumbar region: Secondary | ICD-10-CM

## 2023-11-15 DIAGNOSIS — Z9889 Other specified postprocedural states: Secondary | ICD-10-CM

## 2023-11-15 NOTE — Progress Notes (Signed)
   REFERRING PHYSICIAN:  Kandyce Rud, Md [address] [facility] [place],  [place] [address]  DOS: 10/05/23 left L5-S1 discectomy  HISTORY OF PRESENT ILLNESS: Andrew Richardson is status post above surgery.  He has no pain.  He is walking better.  His weakness is improving.  He is very happy with his improvements.      PHYSICAL EXAMINATION:  General: Patient is well developed, well nourished, calm, collected, and in no apparent distress.   NEUROLOGICAL:  General: In no acute distress.   Awake, alert, oriented to person, place, and time.  Pupils equal round and reactive to light.  Facial tone is symmetric.     Strength:           Side Iliopsoas Quads Hamstring PF DF EHL  R 5 5 5 5 5 5   L 5 5 5 5  4+ 4+    Incision c/d/I  He is able to left left heel off the ground to stand on toes.    ROS (Neurologic):  Negative except as noted above  IMAGING: Nothing new to review.   ASSESSMENT/PLAN:  Andrew Richardson is doing well s/p above surgery.  I think he will continue to improve from his weakness.  I will take some time to get to a final state.  I have encouraged him to continue exercising.  We reviewed his activity limitations.    We will see him back in clinic in 6 weeks.  Venetia Night MD Department of neurosurgery

## 2023-12-09 NOTE — Progress Notes (Signed)
   REFERRING PHYSICIAN:  Kandyce Rud, Md [address] [facility] [place],  [place] [address]  DOS: 10/05/23 left L5-S1 discectomy  HISTORY OF PRESENT ILLNESS:  He was doing well at his last visit with no pain. Walking and weakness were improving.   He has no back or leg pain, but feels like weakness in his legs is getting worse. He feels like he is "wobbling" when he gets up from seated position at time.   No dexterity or balance issues. No bowel or bladder issues.   PHYSICAL EXAMINATION:  General: Patient is well developed, well nourished, calm, collected, and in no apparent distress.   NEUROLOGICAL:  General: In no acute distress.   Awake, alert, oriented to person, place, and time.  Pupils equal round and reactive to light.  Facial tone is symmetric.    Strength: Side Biceps Triceps Deltoid Interossei Grip Wrist Ext. Wrist Flex.  R 5 5 5 5 5 5 5   L 5 5 5 5 5 5 5    Side Iliopsoas Quads Hamstring PF DF EHL  R 5 5 5 5  4+ 4+  L 5 5 5 5 5 5    Reflexes are 2+ and symmetric at the biceps, brachioradialis, patella and achilles.   Hoffman's is absent.           Incision well healed.   He is able to lift left and right heel off the ground to stand on toes, but cannot hold for prolonged period.    ROS (Neurologic):  Negative except as noted above  IMAGING: Nothing new to review.   ASSESSMENT/PLAN:  TERRY BOLOTIN is doing fair s/p above surgery. Preop left foot weakness is better. Having some newer right foot weakness. No significant LBP or leg pain. Treatment options reviewed with patient and following plan made:   - PT for lumbar spine. Orders to [facility].  - If no improvement with weakness with PT, will get updated lumbar MRI.  - Will call me if weakness gets worse.  - Follow up with me in 6 weeks and prn.   Advised to contact the office if any questions or concerns arise.  Drake Leach PA-C Department of neurosurgery

## 2023-12-22 ENCOUNTER — Ambulatory Visit (INDEPENDENT_AMBULATORY_CARE_PROVIDER_SITE_OTHER): Payer: Medicare Other | Admitting: Orthopedic Surgery

## 2023-12-22 ENCOUNTER — Encounter: Payer: Self-pay | Admitting: Orthopedic Surgery

## 2023-12-22 VITALS — BP 132/86 | Ht 67.0 in | Wt 198.0 lb

## 2023-12-22 DIAGNOSIS — M5126 Other intervertebral disc displacement, lumbar region: Secondary | ICD-10-CM

## 2023-12-22 DIAGNOSIS — Z9889 Other specified postprocedural states: Secondary | ICD-10-CM

## 2023-12-22 DIAGNOSIS — M5416 Radiculopathy, lumbar region: Secondary | ICD-10-CM

## 2023-12-22 DIAGNOSIS — R29898 Other symptoms and signs involving the musculoskeletal system: Secondary | ICD-10-CM

## 2023-12-22 NOTE — Patient Instructions (Signed)
 It was so nice to see you today. Thank you so much for coming in.    The weakness in your left foot seems to be better. You have some new weakness in right foot.   I sent physical therapy orders to [facility] in [place]. You can call them at [phone] if you don't hear from them to schedule your visit. They are located at [address], [place], [place] [address].   If weakness gets worse, let me know before your follow up. We may need to get a new lumbar MRI.   I will see you back in 6 weeks. Please do not hesitate to call if you have any questions or concerns. You can also message me in MyChart.   Drake Leach PA-C [phone]     The physicians and staff at [facility] Neurosurgery at [facility] are committed to providing excellent care. You may receive a survey asking for feedback about your experience at our office. We value you your feedback and appreciate you taking the time to to fill it out. The [facility] leadership team is also available to discuss your experience in person, feel free to contact us [phone].

## 2023-12-22 NOTE — Addendum Note (Signed)
 Addended byDrake Leach on: 12/22/2023 12:48 PM   Modules accepted: Orders

## 2024-01-27 NOTE — Progress Notes (Unsigned)
   REFERRING PHYSICIAN:  Nestor Banter, Md [address] [facility] [place],  [place] [address]  DOS: 10/05/23 left L5-S1 discectomy  HISTORY OF PRESENT ILLNESS:  Last seen by me on 12/22/23 and he was having weakness in his legs. He had some new weakness in right foot.   He was sent to PT and is here for evaluation.   He has not seen much improvement with PT regarding his weakness. His has some weakness in both legs. He has intermittent soreness in his lower back. No leg pain. He is walking better and further than he did prior to surgery. Current symptoms are mostly tolerable.   PHYSICAL EXAMINATION:  General: Patient is well developed, well nourished, calm, collected, and in no apparent distress.   NEUROLOGICAL:  General: In no acute distress.   Awake, alert, oriented to person, place, and time.  Pupils equal round and reactive to light.  Facial tone is symmetric.    Strength:  Side Iliopsoas Quads Hamstring PF DF EHL  R 5 5 5 5  4+ 4+  L 5 5 5  4+ 4+ 4+           Incision well healed.   He has difficulty standing on toes on left, can do on right side. He has difficulty standing on heels on both feet.  Sensation intact to light touch in bilateral lower extremities.   ROS (Neurologic):  Negative except as noted above  IMAGING: Nothing new to review.   ASSESSMENT/PLAN:  Andrew Richardson is doing fair s/p above surgery. He feels like he is much better then he was preop. He had weakness in left leg prior to surgery.   He has intermittent LBP that is more of a soreness. No leg pain, but notes weakness in both feet. Not much improvement with PT. Treatment options reviewed with patient and following plan made:   - MRI of lumbar spine to further evaluate weakness in both feet.  - Continue PT for lumbar spine.  - Will schedule follow up with me once I have MRI results back.  - Depending on MRI results, may also consider LE EMG/NCS.    Advised to contact the office if any questions or concerns arise.  I spent a total of 20 minutes in face-to-face and non-face-to-face activities related to this patient's care today including review of outside records, review of imaging, review of symptoms, physical exam, discussion of differential diagnosis, discussion of treatment options, and documentation.   Lucetta Russel PA-C Department of neurosurgery

## 2024-02-02 ENCOUNTER — Ambulatory Visit: Admitting: Orthopedic Surgery

## 2024-02-02 ENCOUNTER — Encounter: Payer: Self-pay | Admitting: Orthopedic Surgery

## 2024-02-02 VITALS — BP 136/80 | Ht 67.0 in | Wt 198.0 lb

## 2024-02-02 DIAGNOSIS — Z9889 Other specified postprocedural states: Secondary | ICD-10-CM | POA: Diagnosis not present

## 2024-02-02 DIAGNOSIS — M47816 Spondylosis without myelopathy or radiculopathy, lumbar region: Secondary | ICD-10-CM

## 2024-02-02 DIAGNOSIS — R29898 Other symptoms and signs involving the musculoskeletal system: Secondary | ICD-10-CM | POA: Diagnosis not present

## 2024-02-06 ENCOUNTER — Ambulatory Visit
Admission: RE | Admit: 2024-02-06 | Discharge: 2024-02-06 | Disposition: A | Discharge status: Home / Self Care | Source: Ambulatory Visit | Attending: Orthopedic Surgery | Admitting: Orthopedic Surgery

## 2024-02-06 DIAGNOSIS — Z9889 Other specified postprocedural states: Secondary | ICD-10-CM | POA: Insufficient documentation

## 2024-02-06 DIAGNOSIS — M47816 Spondylosis without myelopathy or radiculopathy, lumbar region: Secondary | ICD-10-CM | POA: Diagnosis present

## 2024-02-06 DIAGNOSIS — R29898 Other symptoms and signs involving the musculoskeletal system: Secondary | ICD-10-CM | POA: Insufficient documentation

## 2024-03-02 NOTE — Progress Notes (Signed)
 REFERRING PHYSICIAN:  Nestor Banter, Md [address] [facility] [place],  [place] [address]  DOS: 10/05/23 left L5-S1 discectomy  HISTORY OF PRESENT ILLNESS:  He had weakness in left leg prior to surgery. At last visit, he had intermittent LBP that was more of a soreness. No leg pain, but notes weakness in both feet. Not much improvement with PT.   Lumbar MRI was ordered and he is here for follow up.       He was sent to PT and is here for evaluation.   He has not seen much improvement with PT regarding his weakness. His has some weakness in both legs. He has intermittent soreness in his lower back. No leg pain. He is walking better and further than he did prior to surgery. Current symptoms are mostly tolerable.   PHYSICAL EXAMINATION:  General: Patient is well developed, well nourished, calm, collected, and in no apparent distress.   NEUROLOGICAL:  General: In no acute distress.   Awake, alert, oriented to person, place, and time.  Pupils equal round and reactive to light.  Facial tone is symmetric.    Strength:  Side Iliopsoas Quads Hamstring PF DF EHL  R 5 5 5 5 5 5   L 5 5 5 5 5 5         Incision well healed.   No noticeable weakness on above testing. He can stand on toes on both feet, but has difficulty doing left foot independently.   No weakness in left foot inversion/eversion.   Sensation intact to light touch in bilateral lower extremities.   ROS (Neurologic):  Negative except as noted above  IMAGING: Lumbar MRI dated 02/06/24:  FINDINGS: Segmentation: 5 lumbar type vertebral bodies as numbered previously.   Alignment: Mild S shaped scoliotic curvature. No significant listhesis.   Vertebrae: No fracture or focal bone lesion. No edematous endplate marrow changes or edematous facet arthritis.   Conus medullaris and cauda equina: Conus extends to the L1-2 level. Conus and cauda equina appear normal.   Paraspinal  and other soft tissues: Negative   Disc levels:   Non-compressive disc bulges from T12-L1 through L4-5. Mild lateral recess narrowing at L2-3 and L3-4 but no likely neural compression. No change. Some facet degeneration in the lower lumbar spine but without gross edematous change.   L5-S1: Interval left laminotomy and discectomy. Endplate osteophytes and bulging of the disc. No frank recurrent disc herniation. There is likely epidural fibrosis on the left, but this would be better demonstrated with the use of contrast. No significant impressive effect upon the thecal sac. Bilateral foraminal stenosis could possibly affect either or both L5 nerves.   IMPRESSION: 1. Interval left laminotomy and discectomy at L5-S1. No frank recurrent disc herniation. There is likely epidural fibrosis on the left, but this would be better demonstrated with the use of contrast. No significant impression upon the thecal sac. Bilateral foraminal stenosis could possibly affect either or both L5 nerves, similar to the prior exam. 2. Non-compressive disc bulges from T12-L1 through L4-5. Mild lateral recess narrowing at L2-3 and L3-4 but no likely neural compression.     Electronically Signed   By: Bettylou Brunner M.D.   On: 02/27/2024 16:48  I have personally reviewed the images and agree with the above interpretation.   ASSESSMENT/PLAN:  Andrew Richardson is doing better since his surgery. He feels like he is much better then he was preop. He had weakness in  left leg prior to surgery.   He has not significant back or radicular leg pain. He is walking much better than he was preop. He still notes some feelings of weakness in both legs, but he can push past it.   MRI shows moderate bilateral foraminal stenosis L5-S1, possible epidural fibrosis on left. No recurrent disc herniation.   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   Treatment options  reviewed with patient and following plan made:   - His exam is improved since his last visit.  - He is done with PT and will continue with HEP.  - Discussed getting EMG/NCS and he declines for now. Not sure it would change treatment plan.  - He will follow up with me in 3 months and prn.   Advised to contact the office if any questions or concerns arise.  I spent a total of 20 minutes in face-to-face and non-face-to-face activities related to this patient's care today including review of outside records, review of imaging, review of symptoms, physical exam, discussion of differential diagnosis, discussion of treatment options, and documentation.   Lucetta Russel PA-C Department of neurosurgery

## 2024-03-05 ENCOUNTER — Ambulatory Visit (INDEPENDENT_AMBULATORY_CARE_PROVIDER_SITE_OTHER): Admitting: Orthopedic Surgery

## 2024-03-05 ENCOUNTER — Encounter: Payer: Self-pay | Admitting: Orthopedic Surgery

## 2024-03-05 VITALS — BP 134/86 | Ht 67.0 in | Wt 198.0 lb

## 2024-03-05 DIAGNOSIS — Z9889 Other specified postprocedural states: Secondary | ICD-10-CM

## 2024-03-05 DIAGNOSIS — M47816 Spondylosis without myelopathy or radiculopathy, lumbar region: Secondary | ICD-10-CM | POA: Diagnosis not present

## 2024-06-01 NOTE — Progress Notes (Signed)
   REFERRING PHYSICIAN:  Diedra Lame, Md [address] [facility] [place],  [place] [address]  DOS: 10/05/23 left L5-S1 discectomy  HISTORY OF PRESENT ILLNESS:  He had weakness in left leg prior to surgery. At last visit, he was doing better. He still noted some weakness in both legs.   MRI showed moderate bilateral foraminal stenosis L5-S1, possible epidural fibrosis on left. No recurrent disc herniation. 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    He was to continue with HEP from PT. Discussed EMG and he declined. He is here for follow up.   He has only minimal intermittent LBP that is tolerable. He is doing HEP from PT. He has numbness/tingling from knees to feet that is constant. Still feels like his walking isn't great- legs feel weak. He is limited in his activity due to his legs.    PHYSICAL EXAMINATION:  General: Patient is well developed, well nourished, calm, collected, and in no apparent distress.   NEUROLOGICAL:  General: In no acute distress.   Awake, alert, oriented to person, place, and time.  Pupils equal round and reactive to light.  Facial tone is symmetric.    Strength:  Side Iliopsoas Quads Hamstring PF DF EHL  R 5 5 5 5 5 5   L 5 5 5 5  4- 4-        He can stand on toes on both feet. He can heel stand on right, but has difficulty doing this on the left.   No weakness with bilateral foot inversion/eversion.   Sensation intact to light touch in bilateral lower extremities.   ROS (Neurologic):  Negative except as noted above  IMAGING: none  ASSESSMENT/PLAN:  Andrew Richardson is doing better since his surgery regarding his back. He has numbness/tingling from knees to feet that is constant. Still feels like his walking isn't great- legs feel weak. He is limited in his activity due to his legs.   MRI shows moderate bilateral foraminal stenosis L5-S1,  possible epidural fibrosis on left. No recurrent disc herniation.   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   Treatment options reviewed with patient and following plan made:   - Legs are still limiting his activity.  - Discussed getting EMG/NCS and he declines for now. Not sure it would change treatment plan.  - Will review with Dr. Clois and send him a message with any further recommendations.   Advised to contact the office if any questions or concerns arise.  I spent a total of 15 minutes in face-to-face and non-face-to-face activities related to this patient's care today including review of outside records, review of imaging, review of symptoms, physical exam, discussion of differential diagnosis, discussion of treatment options, and documentation.   Andrew Boys PA-C Department of neurosurgery

## 2024-06-04 ENCOUNTER — Encounter: Payer: Self-pay | Admitting: Orthopedic Surgery

## 2024-06-04 ENCOUNTER — Ambulatory Visit (INDEPENDENT_AMBULATORY_CARE_PROVIDER_SITE_OTHER): Admitting: Orthopedic Surgery

## 2024-06-04 VITALS — BP 136/80 | Ht 68.0 in | Wt 192.5 lb

## 2024-06-04 DIAGNOSIS — M47816 Spondylosis without myelopathy or radiculopathy, lumbar region: Secondary | ICD-10-CM | POA: Diagnosis not present

## 2024-06-04 DIAGNOSIS — Z9889 Other specified postprocedural states: Secondary | ICD-10-CM

## 2024-06-04 DIAGNOSIS — M4807 Spinal stenosis, lumbosacral region: Secondary | ICD-10-CM | POA: Diagnosis not present

## 2024-08-06 NOTE — Discharge Instructions (Signed)

## 2024-08-07 ENCOUNTER — Encounter: Payer: Self-pay | Admitting: Ophthalmology

## 2024-08-07 NOTE — Anesthesia Preprocedure Evaluation (Addendum)
 Anesthesia Evaluation  Patient identified by MRN, date of birth, ID band Patient awake    Reviewed: Allergy & Precautions, H&P , NPO status , Patient's Chart, lab work & pertinent test results  Airway Mallampati: I  TM Distance: <3 FB Neck ROM: Full    Dental no notable dental hx. (+) Caps   Pulmonary neg pulmonary ROS, former smoker   Pulmonary exam normal breath sounds clear to auscultation       Cardiovascular hypertension, + CAD, + Past MI and + Peripheral Vascular Disease  negative cardio ROS Normal cardiovascular exam Rhythm:Regular Rate:Normal  10-06-20 cath  2nd Mrg lesion is 80% stenosed.  1st Mrg lesion is 50% stenosed.  Mid LAD lesion is 80% stenosed.  Prox LAD to Mid LAD lesion is 70% stenosed.  1st Diag lesion is 80% stenosed.  Mid RCA lesion is 75% stenosed.  Dist RCA lesion is 50% stenosed.   1.  Non-ST elevation myocardial infarction 2.  Three-vessel coronary artery disease 3.  Mildly reduced left ventricular function   10-07-20 echo 1. Left ventricular ejection fraction, by estimation, is 55%. The left  ventricle has normal function. The left ventricle has no regional wall  motion abnormalities. Left ventricular diastolic parameters are consistent  with Grade I diastolic dysfunction  (impaired relaxation).   2. Right ventricular systolic function is normal. The right ventricular  size is normal. Tricuspid regurgitation signal is inadequate for assessing  PA pressure.   3. The mitral valve is normal in structure. No evidence of mitral valve  regurgitation. No evidence of mitral stenosis.   4. The aortic valve is tricuspid. Aortic valve regurgitation is not  visualized. No aortic stenosis is present.   5. The inferior vena cava is normal in size with greater than 50%  respiratory variability, suggesting right atrial pressure of 3 mmHg.      Neuro/Psych  Neuromuscular disease negative  neurological ROS  negative psych ROS   GI/Hepatic negative GI ROS, Neg liver ROS,,,  Endo/Other  negative endocrine ROSHypothyroidism    Renal/GU negative Renal ROS  negative genitourinary   Musculoskeletal negative musculoskeletal ROS (+)    Abdominal   Peds negative pediatric ROS (+)  Hematology negative hematology ROS (+)   Anesthesia Other Findings Coronary artery disease  Hypertension Colon polyps  Hyperlipemia Tubular adenoma of colon  Transitional cell carcinoma of bladder (HCC) Prediabetes  Hypothyroid NSTEMI (non-ST elevated myocardial infarction) (2022)  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 PAD (peripheral artery disease) Pre-diabetes    Reproductive/Obstetrics negative OB ROS                              Anesthesia Physical Anesthesia Plan  ASA: 3  Anesthesia Plan: MAC   Post-op Pain Management:    Induction: Intravenous  PONV Risk Score and Plan:   Airway Management Planned: Natural Airway and Nasal Cannula  Additional Equipment:   Intra-op Plan:   Post-operative Plan:   Informed Consent: I have reviewed the patients History and Physical, chart, labs and discussed the procedure including the risks, benefits and alternatives for the proposed anesthesia with the patient or authorized representative who has indicated his/her understanding and acceptance.     Dental Advisory Given  Plan Discussed with: Anesthesiologist, CRNA and Surgeon  Anesthesia Plan Comments: (Patient consented for risks of anesthesia including but not limited to:  - adverse reactions to medications - damage to eyes, teeth, lips or other oral  mucosa - nerve damage due to positioning  - sore throat or hoarseness - Damage to heart, brain, nerves, lungs, other parts of body or loss of life  Patient voiced understanding and assent.)         Anesthesia Quick Evaluation

## 2024-08-08 ENCOUNTER — Encounter: Payer: Self-pay | Admitting: Ophthalmology

## 2024-08-08 ENCOUNTER — Other Ambulatory Visit: Payer: Self-pay

## 2024-08-08 ENCOUNTER — Ambulatory Visit
Admission: RE | Admit: 2024-08-08 | Discharge: 2024-08-08 | Disposition: A | Discharge status: Home / Self Care | Attending: Ophthalmology | Admitting: Ophthalmology

## 2024-08-08 ENCOUNTER — Ambulatory Visit: Payer: Self-pay | Admitting: Anesthesiology

## 2024-08-08 ENCOUNTER — Encounter
Admission: RE | Disposition: A | Discharge status: Home / Self Care | Payer: Self-pay | Source: Home / Self Care | Attending: Ophthalmology

## 2024-08-08 DIAGNOSIS — Z87891 Personal history of nicotine dependence: Secondary | ICD-10-CM | POA: Diagnosis not present

## 2024-08-08 DIAGNOSIS — H2511 Age-related nuclear cataract, right eye: Secondary | ICD-10-CM | POA: Insufficient documentation

## 2024-08-08 DIAGNOSIS — I252 Old myocardial infarction: Secondary | ICD-10-CM | POA: Diagnosis not present

## 2024-08-08 DIAGNOSIS — I739 Peripheral vascular disease, unspecified: Secondary | ICD-10-CM | POA: Diagnosis not present

## 2024-08-08 DIAGNOSIS — I10 Essential (primary) hypertension: Secondary | ICD-10-CM | POA: Diagnosis not present

## 2024-08-08 DIAGNOSIS — I2510 Atherosclerotic heart disease of native coronary artery without angina pectoris: Secondary | ICD-10-CM | POA: Diagnosis not present

## 2024-08-08 DIAGNOSIS — E039 Hypothyroidism, unspecified: Secondary | ICD-10-CM | POA: Diagnosis not present

## 2024-08-08 HISTORY — PX: CATARACT EXTRACTION W/PHACO: SHX586

## 2024-08-08 HISTORY — DX: Prediabetes: R73.03

## 2024-08-08 SURGERY — PHACOEMULSIFICATION, CATARACT, WITH IOL INSERTION
Anesthesia: Monitor Anesthesia Care | Site: Eye | Laterality: Right

## 2024-08-08 MED ORDER — TETRACAINE HCL 0.5 % OP SOLN
OPHTHALMIC | Status: AC
Start: 2024-08-08 — End: 2024-08-08
  Filled 2024-08-08: qty 4

## 2024-08-08 MED ORDER — MIDAZOLAM HCL 2 MG/2ML IJ SOLN
INTRAMUSCULAR | Status: AC
  Filled 2024-08-08: qty 2

## 2024-08-08 MED ORDER — PHENYLEPHRINE HCL 10 % OP SOLN
1.0000 [drp] | OPHTHALMIC | Status: AC
  Administered 2024-08-08 (×3): 1 [drp] via OPHTHALMIC

## 2024-08-08 MED ORDER — FENTANYL CITRATE (PF) 100 MCG/2ML IJ SOLN
INTRAMUSCULAR | Status: AC
  Filled 2024-08-08: qty 2

## 2024-08-08 MED ORDER — LACTATED RINGERS IV SOLN: INTRAVENOUS | Status: DC

## 2024-08-08 MED ORDER — PHENYLEPHRINE HCL 10 % OP SOLN
OPHTHALMIC | Status: AC
  Filled 2024-08-08: qty 5

## 2024-08-08 MED ORDER — CYCLOPENTOLATE HCL 2 % OP SOLN
1.0000 [drp] | OPHTHALMIC | Status: AC
  Administered 2024-08-08 (×3): 1 [drp] via OPHTHALMIC

## 2024-08-08 MED ORDER — BRIMONIDINE TARTRATE-TIMOLOL 0.2-0.5 % OP SOLN
OPHTHALMIC | Status: DC | PRN
  Administered 2024-08-08: 1 [drp] via OPHTHALMIC

## 2024-08-08 MED ORDER — SIGHTPATH DOSE#1 NA HYALUR & NA CHOND-NA HYALUR IO KIT
PACK | INTRAOCULAR | Status: DC | PRN
  Administered 2024-08-08: 1 via OPHTHALMIC

## 2024-08-08 MED ORDER — SIGHTPATH DOSE#1 BSS IO SOLN
INTRAOCULAR | Status: DC | PRN
Start: 2024-08-08 — End: 2024-08-08
  Administered 2024-08-08: 75 mL via OPHTHALMIC

## 2024-08-08 MED ORDER — CEFUROXIME OPHTHALMIC INJECTION 1 MG/0.1 ML
INJECTION | OPHTHALMIC | Status: DC | PRN
Start: 2024-08-08 — End: 2024-08-08
  Administered 2024-08-08: .1 mL via INTRACAMERAL

## 2024-08-08 MED ORDER — SIGHTPATH DOSE#1 BSS IO SOLN
INTRAOCULAR | Status: DC | PRN
  Administered 2024-08-08: 15 mL via INTRAOCULAR

## 2024-08-08 MED ORDER — CYCLOPENTOLATE HCL 2 % OP SOLN
OPHTHALMIC | Status: AC
  Filled 2024-08-08: qty 2

## 2024-08-08 MED ORDER — TETRACAINE HCL 0.5 % OP SOLN
1.0000 [drp] | OPHTHALMIC | Status: DC | PRN
  Administered 2024-08-08 (×3): 1 [drp] via OPHTHALMIC

## 2024-08-08 MED ORDER — FENTANYL CITRATE (PF) 100 MCG/2ML IJ SOLN
INTRAMUSCULAR | Status: DC | PRN
  Administered 2024-08-08: 50 ug via INTRAVENOUS

## 2024-08-08 MED ORDER — MIDAZOLAM HCL (PF) 2 MG/2ML IJ SOLN
INTRAMUSCULAR | Status: DC | PRN
  Administered 2024-08-08: .5 mg via INTRAVENOUS

## 2024-08-08 MED ORDER — BALANCED SALT IO SOLN
INTRAOCULAR | Status: DC | PRN
  Administered 2024-08-08: 2 mL

## 2024-08-08 SURGICAL SUPPLY — 10 items
Clareon PanOptix Pro Toric 21.0 D (Intraocular Lens) ×1 IMPLANT
FEE CATARACT SUITE SIGHTPATH (MISCELLANEOUS) ×1 IMPLANT
GLOVE BIOGEL PI IND STRL 8 (GLOVE) ×1 IMPLANT
GLOVE SURG LX STRL 7.5 STRW (GLOVE) ×1 IMPLANT
GLOVE SURG SYN 6.5 PF PI BL (GLOVE) ×1 IMPLANT
LENS IOL PANO PRO TORC 21.0 IMPLANT
NDL FILTER BLUNT 18X1 1/2 (NEEDLE) ×1 IMPLANT
NEEDLE FILTER BLUNT 18X1 1/2 (NEEDLE) ×1 IMPLANT
RING MALYGIN 7.0 (MISCELLANEOUS) IMPLANT
SYR 3ML LL SCALE MARK (SYRINGE) ×1 IMPLANT

## 2024-08-08 NOTE — Transfer of Care (Signed)
 Immediate Anesthesia Transfer of Care Note  Patient: Andrew Richardson  Procedure(s) Performed: PHACOEMULSIFICATION, CATARACT, WITH IOL INSERTION 7.59 00:30.7 (Right: Eye)  Patient Location: PACU  Anesthesia Type: MAC  Level of Consciousness: awake, alert  and patient cooperative  Airway and Oxygen Therapy: Patient Spontanous Breathing and Patient connected to supplemental oxygen  Post-op Assessment: Post-op Vital signs reviewed, Patient's Cardiovascular Status Stable, Respiratory Function Stable, Patent Airway and No signs of Nausea or vomiting  Post-op Vital Signs: Reviewed and stable  Complications: No notable events documented.

## 2024-08-08 NOTE — Op Note (Signed)
 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 Item Serial No. Manufacturer Lot No. LRB No. Used Action  [employer] PanOptix Pro Toric 21.0 D Intraocular Lens  [id number] [employer]  Right 1 Implanted     [id number] Panoptix Toric intraocular lens with 1.5 diopters of cylindrical power with axis orientation at 21 degrees.   SURGEON:  Dene FABIENE Etienne, MD   ANESTHESIA: Topical with tetracaine drops and 2% Xylocaine  jelly, augmented with 1% preservative-free intracameral lidocaine . .   COMPLICATIONS:  None.   DESCRIPTION OF PROCEDURE:  The patient was identified in the holding room and transported to the operating suite and placed in the supine position under the operating microscope.  The right eye was identified as the operative eye, and it was prepped and draped in the usual sterile ophthalmic fashion.    A clear-corneal paracentesis incision was made at the 12:00 position.  0.5 ml of preservative-free 1% lidocaine  was injected into the anterior chamber. The anterior chamber was filled with Viscoat.  A 2.4 millimeter near clear corneal incision was then made at the 9:00 position. A 7mm Malyugin ring was placed. A cystotome and capsulorrhexis forceps were then used to make a curvilinear capsulorrhexis.  Hydrodissection and hydrodelineation were then performed using balanced salt solution.   Phacoemulsification was then used in stop and chop fashion to remove the lens, nucleus and epinucleus.  The remaining cortex was aspirated using the irrigation and aspiration handpiece.  Provisc viscoelastic was then placed into  the capsular bag to distend it for lens placement.  The Verion digital marker was used to align the implant at the intended axis.   A Toric lens was then injected into the capsular bag.  It was rotated clockwise until the axis marks on the lens were approximately 15 degrees in the counterclockwise direction to the intended alignment. The Malyugin ring was removed.  The viscoelastic was aspirated from the eye using the irrigation aspiration handpiece.  Then, a Koch spatula through the sideport incision was used to rotate the lens in a clockwise direction until the axis markings of the intraocular lens were lined up with the Verion alignment.  Balanced salt solution was then used to hydrate the wounds. Cefuroxime  0.1 ml of a 10mg /ml solution was injected into the anterior chamber for a dose of 1 mg of intracameral antibiotic at the completion of the case.    The eye was noted to have a physiologic pressure and there was no wound leak noted.   Timolol and Brimonidine drops were applied to the eye.  The patient was taken to the recovery room in stable condition having had no complications of anesthesia or surgery.  Andrew Richardson 08/08/2024, 9:18 AM

## 2024-08-08 NOTE — Anesthesia Postprocedure Evaluation (Signed)
 Anesthesia Post Note  Patient: Andrew Richardson  Procedure(s) Performed: PHACOEMULSIFICATION, CATARACT, WITH IOL INSERTION 7.59 00:30.7 (Right: Eye)  Patient location during evaluation: PACU Anesthesia Type: MAC Level of consciousness: awake and alert Pain management: pain level controlled Vital Signs Assessment: post-procedure vital signs reviewed and stable Respiratory status: spontaneous breathing, nonlabored ventilation, respiratory function stable and patient connected to nasal cannula oxygen Cardiovascular status: stable and blood pressure returned to baseline Postop Assessment: no apparent nausea or vomiting Anesthetic complications: no   No notable events documented.   Last Vitals:  Vitals:   08/08/24 0920 08/08/24 0926  BP:  129/66  Pulse: (!) 50 (!) 44  Resp: 19 (!) 9  Temp:  (!) 36.2 C  SpO2: 98% 98%    Last Pain:  Vitals:   08/08/24 0926  TempSrc:   PainSc: 0-No pain                 Donny JAYSON Mu

## 2024-08-08 NOTE — H&P (Signed)
 [facility]   Primary Care Physician:  Diedra Lame, MD Ophthalmologist: Dr. Dene Etienne  Pre-Procedure History & Physical: HPI:  Andrew Richardson is a 83 y.o. male here for ophthalmic surgery.   Past Medical History:  Diagnosis Date   Colon polyps    Coronary artery disease    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    Grade I diastolic dysfunction 10/07/2020   Hyperlipemia    Hypertension    Hypothyroid    NSTEMI (non-ST elevated myocardial infarction) (HCC) 10/06/2020   PAD (peripheral artery disease)    Pre-diabetes    Prediabetes    Transitional cell carcinoma of bladder (HCC)    Tubular adenoma of colon     Past Surgical History:  Procedure Laterality Date   ANGIOPLASTY     COLONOSCOPY     COLONOSCOPY WITH PROPOFOL  N/A 05/20/2015   Procedure: COLONOSCOPY WITH PROPOFOL ;  Surgeon: Gladis RAYMOND Mariner, MD;  Location: [facility] ENDOSCOPY;  Service: Endoscopy;  Laterality: N/A;   CORONARY ARTERY BYPASS GRAFT N/A 10/08/2020   Procedure: CORONARY ARTERY BYPASS GRAFTING TIMES  FOUR  WITH LEFT INTERNAL MAMMARY ARTERY AND ENDOSCOPIC HARVEST OF BILATERAL  LEG SAPHENOUS VEIN;  Surgeon: Shyrl Linnie KIDD, MD;  Location: [facility] OR;  Service: Open Heart Surgery;  Laterality: N/A;   INGUINAL HERNIA REPAIR Left 10/31/2015   Procedure: HERNIA REPAIR INGUINAL ADULT;  Surgeon: Larinda Unknown Sharps, MD;  Location: [facility] ORS;  Service: General;  Laterality: Left;   LEFT HEART CATH N/A 10/06/2020   Procedure: Left Heart Cath and Coronary Angiography;  Surgeon: Ammon Blunt, MD;  Location: [facility] INVASIVE CV LAB;  Service: Cardiovascular;  Laterality: N/A;   LEFT HEART CATH  1992   LUMBAR LAMINECTOMY/DECOMPRESSION MICRODISCECTOMY Left 10/05/2023   Procedure: LEFT L5-S1 MICRODISCECTOMY;  Surgeon: Clois Fret, MD;  Location: [facility] ORS;  Service: Neurosurgery;  Laterality: Left;   TEE WITHOUT CARDIOVERSION N/A 10/08/2020   Procedure:  TRANSESOPHAGEAL ECHOCARDIOGRAM (TEE);  Surgeon: Shyrl Linnie KIDD, MD;  Location: [facility] OR;  Service: Open Heart Surgery;  Laterality: N/A;    Prior to Admission medications   Medication Sig Start Date End Date Taking? Authorizing Provider  amLODipine  (NORVASC ) 5 MG tablet Take 5 mg by mouth every evening. 09/24/20  Yes [provider]  aspirin  EC 81 MG tablet Take 81 mg by mouth daily. Swallow whole.   Yes [provider]  atorvastatin  (LIPITOR ) 10 MG tablet Take 10 mg by mouth every evening.   Yes [provider]  hydrochlorothiazide  (HYDRODIURIL ) 12.5 MG tablet Take by mouth. 10/27/23 10/26/24 Yes [provider]  levothyroxine  (SYNTHROID ) 75 MCG tablet Take 75 mcg by mouth daily. 09/03/20  Yes [provider]  metoprolol  tartrate (LOPRESSOR ) 25 MG tablet Take 1 tablet (25 mg total) by mouth 2 (two) times daily. Patient taking differently: Take 12.5 mg by mouth 2 (two) times daily. 10/15/20  Yes Dwan Aldo M, PA-C  tamsulosin  (FLOMAX ) 0.4 MG CAPS capsule Take 0.4 mg by mouth daily.   Yes [provider]  telmisartan  (MICARDIS ) 80 MG tablet Take by mouth. 10/27/23 10/26/24 Yes [provider]  fluticasone  (FLONASE ) 50 MCG/ACT nasal spray Place 1 spray into both nostrils daily as needed for allergies or rhinitis.    [provider]    Allergies as of 07/26/2024 - Review Complete 06/04/2024  Allergen Reaction Noted   Simvastatin  Other (See Comments) 04/13/2018   Tetracyclines & related Diarrhea 05/19/2015   Nitrofurantoin Rash 10/23/2015    Family  History  Problem Relation Age of Onset   Rheumatic fever Mother     Social History   Socioeconomic History   Marital status: Married    Spouse name: Lonell    Number of children: 0   Years of education: Not on file   Highest education level: Not on file  Occupational History   Not on file  Tobacco Use   Smoking status: Former    Current packs/day: 0.00     Types: Cigarettes    Quit date: 05/19/1989    Years since quitting: 35.2   Smokeless tobacco: Former  Building Services Engineer status: Never Used  Substance and Sexual Activity   Alcohol use: Yes    Alcohol/week: 3.0 standard drinks of alcohol    Types: 3 Shots of liquor per week    Comment: once a week   Drug use: Never   Sexual activity: Not on file  Other Topics Concern   Not on file  Social History Narrative   Lives with wife at home    Social Drivers of Health   Financial Resource Strain: Low Risk  (04/27/2024)   Received from [facility]   Overall Financial Resource Strain (CARDIA)    Difficulty of Paying Living Expenses: Not hard at all  Food Insecurity: No Food Insecurity (04/27/2024)   Received from [facility]   Hunger Vital Sign    Within the past 12 months, you worried that your food would run out before you got the money to buy more.: Never true    Within the past 12 months, the food you bought just didn't last and you didn't have money to get more.: Never true  Transportation Needs: No Transportation Needs (04/27/2024)   Received from [facility] - Transportation    In the past 12 months, has lack of transportation kept you from medical appointments or from getting medications?: No    Lack of Transportation (Non-Medical): No  Physical Activity: Not on file  Stress: Not on file  Social Connections: Moderately Integrated (10/05/2023)   Social Connection and Isolation Panel    Frequency of Communication with Friends and Family: More than three times a week    Frequency of Social Gatherings with Friends and Family: Twice a week    Attends Religious Services: Never    Database Administrator or Organizations: Yes    Attends Engineer, Structural: More than 4 times per year    Marital Status: Married  Catering Manager Violence: Not At Risk (10/05/2023)   Humiliation, Afraid, Rape, and Kick questionnaire     Fear of Current or Ex-Partner: No    Emotionally Abused: No    Physically Abused: No    Sexually Abused: No    Review of Systems: See HPI, otherwise negative ROS  Physical Exam: BP (!) 165/78   Temp 98.2 F (36.8 C) (Temporal)   Ht 5' 7.99 (1.727 m)   Wt 89.2 kg   SpO2 98%   BMI 29.90 kg/m  General:   Alert,  pleasant and cooperative in NAD Head:  Normocephalic and atraumatic. Lungs:  Clear to auscultation.    Heart:  Regular rate and rhythm.   Impression/Plan: Andrew Richardson is here for ophthalmic surgery.  Risks, benefits, limitations, and alternatives regarding ophthalmic surgery have been reviewed with the patient.  Questions have been answered.  All parties agreeable.   MITTIE GASKIN, MD  08/08/2024, 8:03 AM

## 2024-08-08 NOTE — Anesthesia Preprocedure Evaluation (Addendum)
 Anesthesia Evaluation  Patient identified by MRN, date of birth, ID band Patient awake    Reviewed: Allergy & Precautions, H&P , NPO status , Patient's Chart, lab work & pertinent test results  Airway Mallampati: I  TM Distance: <3 FB Neck ROM: Full    Dental no notable dental hx. (+) Caps   Pulmonary former smoker   Pulmonary exam normal breath sounds clear to auscultation       Cardiovascular hypertension, + CAD, + Past MI and + Peripheral Vascular Disease  Normal cardiovascular exam Rhythm:Regular Rate:Normal  10-06-20 cath 2nd Mrg lesion is 80% stenosed. 1st Mrg lesion is 50% stenosed. Mid LAD lesion is 80% stenosed. Prox LAD to Mid LAD lesion is 70% stenosed. 1st Diag lesion is 80% stenosed. Mid RCA lesion is 75% stenosed. Dist RCA lesion is 50% stenosed.   1.  Non-ST elevation myocardial infarction 2.  Three-vessel coronary artery disease 3.  Mildly reduced left ventricular function     10-07-20 echo 1. Left ventricular ejection fraction, by estimation, is 55%. The left  ventricle has normal function. The left ventricle has no regional wall  motion abnormalities. Left ventricular diastolic parameters are consistent  with Grade I diastolic dysfunction  (impaired relaxation).   2. Right ventricular systolic function is normal. The right ventricular  size is normal. Tricuspid regurgitation signal is inadequate for assessing  PA pressure.   3. The mitral valve is normal in structure. No evidence of mitral valve  regurgitation. No evidence of mitral stenosis.   4. The aortic valve is tricuspid. Aortic valve regurgitation is not  visualized. No aortic stenosis is present.   5. The inferior vena cava is normal in size with greater than 50%  respiratory variability, suggesting right atrial pressure of 3 mmHg.         Neuro/Psych  Neuromuscular disease negative neurological ROS  negative psych ROS    GI/Hepatic negative GI ROS, Neg liver ROS,,,  Endo/Other  negative endocrine ROSHypothyroidism    Renal/GU negative Renal ROS  negative genitourinary   Musculoskeletal negative musculoskeletal ROS (+)    Abdominal   Peds negative pediatric ROS (+)  Hematology negative hematology ROS (+)   Anesthesia Other Findings Previous cataract surgery 08-08-24 Dr. Ola  Medical History  Coronary artery disease Hypertension Colon polyps Hyperlipemia Tubular adenoma of colon Transitional cell carcinoma of bladder (HCC) Prediabetes Hypothyroid NSTEMI (non-ST elevated myocardial infarction) (HCC) 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 PAD (peripheral artery disease) Pre-diabetes Grade I diastolic dysfunction      Reproductive/Obstetrics negative OB ROS                              Anesthesia Physical Anesthesia Plan  ASA: 3  Anesthesia Plan: MAC   Post-op Pain Management:    Induction: Intravenous  PONV Risk Score and Plan:   Airway Management Planned: Natural Airway and Nasal Cannula  Additional Equipment:   Intra-op Plan:   Post-operative Plan:   Informed Consent: I have reviewed the patients History and Physical, chart, labs and discussed the procedure including the risks, benefits and alternatives for the proposed anesthesia with the patient or authorized representative who has indicated his/her understanding and acceptance.     Dental Advisory Given  Plan Discussed with: Anesthesiologist, CRNA and Surgeon  Anesthesia Plan Comments: (Patient consented for risks of anesthesia including but not limited to:  - adverse reactions to medications - damage to eyes, teeth, lips  or other oral mucosa - nerve damage due to positioning  - sore throat or hoarseness - Damage to heart, brain, nerves, lungs, other parts of body or loss of life  Patient voiced understanding and assent.)         Anesthesia  Quick Evaluation

## 2024-08-15 ENCOUNTER — Encounter: Payer: Self-pay | Admitting: Ophthalmology

## 2024-08-16 NOTE — Discharge Instructions (Signed)

## 2024-08-20 ENCOUNTER — Encounter: Payer: Self-pay | Admitting: Ophthalmology

## 2024-08-22 ENCOUNTER — Encounter: Payer: Self-pay | Admitting: Ophthalmology

## 2024-08-22 ENCOUNTER — Ambulatory Visit
Admission: RE | Admit: 2024-08-22 | Discharge: 2024-08-22 | Disposition: A | Discharge status: Home / Self Care | Attending: Ophthalmology | Admitting: Ophthalmology

## 2024-08-22 ENCOUNTER — Encounter
Admission: RE | Disposition: A | Discharge status: Home / Self Care | Payer: Self-pay | Source: Home / Self Care | Attending: Ophthalmology

## 2024-08-22 ENCOUNTER — Other Ambulatory Visit: Payer: Self-pay

## 2024-08-22 ENCOUNTER — Ambulatory Visit: Payer: Self-pay | Admitting: Anesthesiology

## 2024-08-22 DIAGNOSIS — Z9841 Cataract extraction status, right eye: Secondary | ICD-10-CM | POA: Diagnosis not present

## 2024-08-22 DIAGNOSIS — I252 Old myocardial infarction: Secondary | ICD-10-CM | POA: Diagnosis not present

## 2024-08-22 DIAGNOSIS — Z87891 Personal history of nicotine dependence: Secondary | ICD-10-CM | POA: Insufficient documentation

## 2024-08-22 DIAGNOSIS — Z961 Presence of intraocular lens: Secondary | ICD-10-CM | POA: Diagnosis not present

## 2024-08-22 DIAGNOSIS — I739 Peripheral vascular disease, unspecified: Secondary | ICD-10-CM | POA: Insufficient documentation

## 2024-08-22 DIAGNOSIS — I2510 Atherosclerotic heart disease of native coronary artery without angina pectoris: Secondary | ICD-10-CM | POA: Diagnosis not present

## 2024-08-22 DIAGNOSIS — H2512 Age-related nuclear cataract, left eye: Secondary | ICD-10-CM | POA: Insufficient documentation

## 2024-08-22 DIAGNOSIS — H5703 Miosis: Secondary | ICD-10-CM | POA: Diagnosis not present

## 2024-08-22 DIAGNOSIS — I10 Essential (primary) hypertension: Secondary | ICD-10-CM | POA: Insufficient documentation

## 2024-08-22 HISTORY — PX: CATARACT EXTRACTION W/PHACO: SHX586

## 2024-08-22 SURGERY — PHACOEMULSIFICATION, CATARACT, WITH IOL INSERTION
Anesthesia: Monitor Anesthesia Care | Site: Eye | Laterality: Left

## 2024-08-22 MED ORDER — BRIMONIDINE TARTRATE-TIMOLOL 0.2-0.5 % OP SOLN
OPHTHALMIC | Status: DC | PRN
  Administered 2024-08-22: 1 [drp] via OPHTHALMIC

## 2024-08-22 MED ORDER — MIDAZOLAM HCL 2 MG/2ML IJ SOLN
INTRAMUSCULAR | Status: AC
  Filled 2024-08-22: qty 2

## 2024-08-22 MED ORDER — NEOMYCIN-POLYMYXIN-DEXAMETH 3.5-10000-0.1 OP OINT
TOPICAL_OINTMENT | OPHTHALMIC | Status: DC | PRN
  Administered 2024-08-22: 1 via OPHTHALMIC

## 2024-08-22 MED ORDER — TETRACAINE HCL 0.5 % OP SOLN
OPHTHALMIC | Status: AC
Start: 2024-08-22 — End: 2024-08-22
  Filled 2024-08-22: qty 4

## 2024-08-22 MED ORDER — SIGHTPATH DOSE#1 BSS IO SOLN
INTRAOCULAR | Status: DC | PRN
  Administered 2024-08-22: 65 mL via OPHTHALMIC

## 2024-08-22 MED ORDER — CYCLOPENTOLATE HCL 2 % OP SOLN
1.0000 [drp] | OPHTHALMIC | Status: AC
  Administered 2024-08-22 (×3): 1 [drp] via OPHTHALMIC

## 2024-08-22 MED ORDER — SIGHTPATH DOSE#1 BSS IO SOLN
INTRAOCULAR | Status: DC | PRN
  Administered 2024-08-22: 15 mL via INTRAOCULAR

## 2024-08-22 MED ORDER — PHENYLEPHRINE HCL 10 % OP SOLN
OPHTHALMIC | Status: AC
  Filled 2024-08-22: qty 5

## 2024-08-22 MED ORDER — LACTATED RINGERS IV SOLN: INTRAVENOUS | Status: DC

## 2024-08-22 MED ORDER — CYCLOPENTOLATE HCL 2 % OP SOLN
OPHTHALMIC | Status: AC
Start: 2024-08-22 — End: 2024-08-22
  Filled 2024-08-22: qty 2

## 2024-08-22 MED ORDER — CEFUROXIME OPHTHALMIC INJECTION 1 MG/0.1 ML
INJECTION | OPHTHALMIC | Status: DC | PRN
  Administered 2024-08-22: 1 mg via INTRACAMERAL

## 2024-08-22 MED ORDER — SIGHTPATH DOSE#1 NA HYALUR & NA CHOND-NA HYALUR IO KIT
PACK | INTRAOCULAR | Status: DC | PRN
  Administered 2024-08-22: 1 via OPHTHALMIC

## 2024-08-22 MED ORDER — PHENYLEPHRINE HCL 10 % OP SOLN
1.0000 [drp] | OPHTHALMIC | Status: AC
Start: 2024-08-22 — End: 2024-08-22
  Administered 2024-08-22 (×3): 1 [drp] via OPHTHALMIC

## 2024-08-22 MED ORDER — FENTANYL CITRATE (PF) 100 MCG/2ML IJ SOLN
INTRAMUSCULAR | Status: DC | PRN
  Administered 2024-08-22: 50 ug via INTRAVENOUS

## 2024-08-22 MED ORDER — TETRACAINE HCL 0.5 % OP SOLN
1.0000 [drp] | OPHTHALMIC | Status: DC | PRN
  Administered 2024-08-22 (×3): 1 [drp] via OPHTHALMIC

## 2024-08-22 MED ORDER — BALANCED SALT IO SOLN
INTRAOCULAR | Status: DC | PRN
  Administered 2024-08-22: 2 mL

## 2024-08-22 MED ORDER — MIDAZOLAM HCL (PF) 2 MG/2ML IJ SOLN
INTRAMUSCULAR | Status: DC | PRN
  Administered 2024-08-22: .5 mg via INTRAVENOUS

## 2024-08-22 MED ORDER — FENTANYL CITRATE (PF) 100 MCG/2ML IJ SOLN
INTRAMUSCULAR | Status: AC
  Filled 2024-08-22: qty 2

## 2024-08-22 SURGICAL SUPPLY — 15 items
BNDG EYE OVAL 2 1/8 X 2 5/8 (GAUZE/BANDAGES/DRESSINGS) IMPLANT
CANNULA ANT/CHMB 27G (MISCELLANEOUS) IMPLANT
FEE CATARACT SUITE SIGHTPATH (MISCELLANEOUS) ×1 IMPLANT
GLOVE BIOGEL PI IND STRL 8 (GLOVE) ×1 IMPLANT
GLOVE PI ULTRA LF STRL 7.5 (GLOVE) IMPLANT
GLOVE SURG LX STRL 7.5 STRW (GLOVE) ×1 IMPLANT
GLOVE SURG SYN 6.5 PF PI BL (GLOVE) ×1 IMPLANT
LENS IOL PANO PRO TORC 21.0 IMPLANT
NDL FILTER BLUNT 18X1 1/2 (NEEDLE) ×1 IMPLANT
NDL RETROBULBAR .5 NSTRL (NEEDLE) IMPLANT
PACK VIT ANT 23G (MISCELLANEOUS) IMPLANT
RING MALYGIN 7.0 (MISCELLANEOUS) IMPLANT
SUT VICRYL 9 0 (SUTURE) IMPLANT
SUTURE EHLN 10-0 CS-B-6CS-B-6 (SUTURE) IMPLANT
SYR 3ML LL SCALE MARK (SYRINGE) ×1 IMPLANT

## 2024-08-22 NOTE — Anesthesia Postprocedure Evaluation (Signed)
 Anesthesia Post Note  Patient: Andrew Richardson  Procedure(s) Performed: PHACOEMULSIFICATION, CATARACT, WITH IOL INSERTION 5.79 00:34.3 (Left: Eye)  Patient location during evaluation: PACU Anesthesia Type: MAC Level of consciousness: awake and alert Pain management: pain level controlled Vital Signs Assessment: post-procedure vital signs reviewed and stable Respiratory status: spontaneous breathing, nonlabored ventilation, respiratory function stable and patient connected to nasal cannula oxygen Cardiovascular status: stable and blood pressure returned to baseline Postop Assessment: no apparent nausea or vomiting Anesthetic complications: no   No notable events documented.   Last Vitals:  Vitals:   08/22/24 0801 08/22/24 0808  BP: 127/66 129/70  Pulse: (!) 49 (!) 53  Resp: 14 12  Temp: (!) 36.2 C (!) 36.2 C  SpO2: 99% 98%    Last Pain:  Vitals:   08/22/24 0808  TempSrc:   PainSc: 0-No pain                 Donny JAYSON Mu

## 2024-08-22 NOTE — Transfer of Care (Signed)
 Immediate Anesthesia Transfer of Care Note  Patient: Andrew Richardson  Procedure(s) Performed: PHACOEMULSIFICATION, CATARACT, WITH IOL INSERTION 5.79 00:34.3 (Left: Eye)  Patient Location: PACU  Anesthesia Type: MAC  Level of Consciousness: awake, alert  and patient cooperative  Airway and Oxygen Therapy: Patient Spontanous Breathing and Patient connected to supplemental oxygen  Post-op Assessment: Post-op Vital signs reviewed, Patient's Cardiovascular Status Stable, Respiratory Function Stable, Patent Airway and No signs of Nausea or vomiting  Post-op Vital Signs: Reviewed and stable  Complications: No notable events documented.

## 2024-08-22 NOTE — Op Note (Signed)
 LOCATION:  [facility]   PREOPERATIVE DIAGNOSIS:  Nuclear sclerotic cataract of the left eye with miotic pupil H25.12  POSTOPERATIVE DIAGNOSIS:  same   PROCEDURE:  Phacoemulsification with Toric posterior chamber intraocular lens placement of the left eye with Malyugin ring  Ultrasound time: Procedure(s): PHACOEMULSIFICATION, CATARACT, WITH IOL INSERTION 5.79 00:34.3 (Left)  LENS:   Implant Name Type Inv. Item Serial No. Manufacturer Lot No. LRB No. Used Action  LENS IOL PANO PRO TORC 21.0 - [id number]  LENS IOL PANO PRO TORC 21.0 [id number] [employer]  Left 1 Implanted     [id number] Panoptix Toric intraocular lens with 1.5 diopters of cylindrical power with axis orientation at 166 degrees.     SURGEON:  Dene FABIENE Etienne, MD   ANESTHESIA:  Topical with tetracaine  drops and 2% Xylocaine  jelly, augmented with 1% preservative-free intracameral lidocaine .  COMPLICATIONS:  None.   DESCRIPTION OF PROCEDURE:  The patient was identified in the holding room and transported to the operating suite and placed in the supine position under the operating microscope.  The left eye was identified as the operative eye, and it was prepped and draped in the usual sterile ophthalmic fashion.    A clear-corneal paracentesis incision was made at the 1:30 position.  0.5 ml of preservative-free 1% lidocaine  was injected into the anterior chamber. The anterior chamber was filled with Viscoat. A Malyugin ring was placed to enlarge the pupil to 7mm.  A 2.4 millimeter near clear corneal incision was then made at the 10:30 position.  A cystotome and capsulorrhexis forceps were then used to make a curvilinear capsulorrhexis.  Hydrodissection and hydrodelineation were then performed using balanced salt solution.   Phacoemulsification was then used in stop and chop fashion to remove the lens, nucleus and epinucleus.  The remaining cortex was aspirated using the irrigation and aspiration handpiece.  Provisc  viscoelastic was then placed into the capsular bag to distend it for lens placement.  The Verion digital marker was used to align the implant at the intended axis.   A Toric lens was then injected into the capsular bag.  It was rotated clockwise until the axis marks on the lens were approximately 15 degrees in the counterclockwise direction to the intended alignment.  The Malyugin ring was removed. The viscoelastic was aspirated from the eye using the irrigation aspiration handpiece.  Then, a Koch spatula through the sideport incision was used to rotate the lens in a clockwise direction until the axis markings of the intraocular lens were lined up with the Verion alignment.  Balanced salt solution was then used to hydrate the wounds. Cefuroxime  0.1 ml of a 10mg /ml solution was injected into the anterior chamber for a dose of 1 mg of intracameral antibiotic at the completion of the case.    The eye was noted to have a physiologic pressure and there was no wound leak noted.   Timolol  and Brimonidine  drops were applied to the eye.  The patient was taken to the recovery room in stable condition having had no complications of anesthesia or surgery.  Mikaylah Libbey 08/22/2024, 8:01 AM

## 2024-08-22 NOTE — H&P (Signed)
 [facility]   Primary Care Physician:  Diedra Lame, MD Ophthalmologist: Dr. Dene Etienne  Pre-Procedure History & Physical: HPI:  Andrew Richardson is a 83 y.o. male here for ophthalmic surgery.   Past Medical History:  Diagnosis Date   Colon polyps    Coronary artery disease    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    Grade I diastolic dysfunction 10/07/2020   Hyperlipemia    Hypertension    Hypothyroid    NSTEMI (non-ST elevated myocardial infarction) (HCC) 10/06/2020   PAD (peripheral artery disease)    Pre-diabetes    Prediabetes    Transitional cell carcinoma of bladder (HCC)    Tubular adenoma of colon     Past Surgical History:  Procedure Laterality Date   ANGIOPLASTY     CATARACT EXTRACTION W/PHACO Right 08/08/2024   Procedure: PHACOEMULSIFICATION, CATARACT, WITH IOL INSERTION 7.59 00:30.7;  Surgeon: Etienne Dene, MD;  Location: [facility];  Service: Ophthalmology;  Laterality: Right;   COLONOSCOPY     COLONOSCOPY WITH PROPOFOL  N/A 05/20/2015   Procedure: COLONOSCOPY WITH PROPOFOL ;  Surgeon: Gladis RAYMOND Mariner, MD;  Location: [facility];  Service: Endoscopy;  Laterality: N/A;   CORONARY ARTERY BYPASS GRAFT N/A 10/08/2020   Procedure: CORONARY ARTERY BYPASS GRAFTING TIMES  FOUR  WITH LEFT INTERNAL MAMMARY ARTERY AND ENDOSCOPIC HARVEST OF BILATERAL  LEG SAPHENOUS VEIN;  Surgeon: Shyrl Linnie KIDD, MD;  Location: [facility] OR;  Service: Open Heart Surgery;  Laterality: N/A;   INGUINAL HERNIA REPAIR Left 10/31/2015   Procedure: HERNIA REPAIR INGUINAL ADULT;  Surgeon: Larinda Unknown Sharps, MD;  Location: [facility] ORS;  Service: General;  Laterality: Left;   LEFT HEART CATH N/A 10/06/2020   Procedure: Left Heart Cath and Coronary Angiography;  Surgeon: Ammon Blunt, MD;  Location: [facility] INVASIVE CV LAB;  Service: Cardiovascular;  Laterality: N/A;   LEFT HEART CATH  1992   LUMBAR  LAMINECTOMY/DECOMPRESSION MICRODISCECTOMY Left 10/05/2023   Procedure: LEFT L5-S1 MICRODISCECTOMY;  Surgeon: Clois Fret, MD;  Location: [facility] ORS;  Service: Neurosurgery;  Laterality: Left;   TEE WITHOUT CARDIOVERSION N/A 10/08/2020   Procedure: TRANSESOPHAGEAL ECHOCARDIOGRAM (TEE);  Surgeon: Shyrl Linnie KIDD, MD;  Location: [facility] OR;  Service: Open Heart Surgery;  Laterality: N/A;    Prior to Admission medications   Medication Sig Start Date End Date Taking? Authorizing Provider  amLODipine  (NORVASC ) 5 MG tablet Take 5 mg by mouth every evening. 09/24/20  Yes [provider]  aspirin  EC 81 MG tablet Take 81 mg by mouth daily. Swallow whole.   Yes [provider]  atorvastatin  (LIPITOR ) 10 MG tablet Take 10 mg by mouth every evening.   Yes [provider]  fluticasone  (FLONASE ) 50 MCG/ACT nasal spray Place 1 spray into both nostrils daily as needed for allergies or rhinitis.   Yes [provider]  hydrochlorothiazide  (HYDRODIURIL ) 12.5 MG tablet Take by mouth. 10/27/23 10/26/24 Yes [provider]  levothyroxine  (SYNTHROID ) 75 MCG tablet Take 75 mcg by mouth daily. 09/03/20  Yes [provider]  metoprolol  tartrate (LOPRESSOR ) 25 MG tablet Take 1 tablet (25 mg total) by mouth 2 (two) times daily. Patient taking differently: Take 12.5 mg by mouth 2 (two) times daily. 10/15/20  Yes Zimmerman, Donielle M, PA-C  tamsulosin  (FLOMAX ) 0.4 MG CAPS capsule Take 0.4 mg by mouth daily.   Yes [provider]  telmisartan  (MICARDIS ) 80 MG tablet Take by mouth. 10/27/23 10/26/24 Yes [provider]    Allergies as  of 07/26/2024 - Review Complete 06/04/2024  Allergen Reaction Noted   Simvastatin  Other (See Comments) 04/13/2018   Tetracyclines & related Diarrhea 05/19/2015   Nitrofurantoin Rash 10/23/2015    Family History  Problem Relation Age of Onset   Rheumatic fever Mother     Social History   Socioeconomic History    Marital status: Married    Spouse name: Lonell    Number of children: 0   Years of education: Not on file   Highest education level: Not on file  Occupational History   Not on file  Tobacco Use   Smoking status: Former    Current packs/day: 0.00    Types: Cigarettes    Quit date: 05/19/1989    Years since quitting: 35.2   Smokeless tobacco: Former  Building Services Engineer status: Never Used  Substance and Sexual Activity   Alcohol use: Yes    Alcohol/week: 3.0 standard drinks of alcohol    Types: 3 Shots of liquor per week    Comment: once a week   Drug use: Never   Sexual activity: Not on file  Other Topics Concern   Not on file  Social History Narrative   Lives with wife at home    Social Drivers of Health   Financial Resource Strain: Low Risk  (04/27/2024)   Received from [facility]   Overall Financial Resource Strain (CARDIA)    Difficulty of Paying Living Expenses: Not hard at all  Food Insecurity: No Food Insecurity (04/27/2024)   Received from [facility]   Hunger Vital Sign    Within the past 12 months, you worried that your food would run out before you got the money to buy more.: Never true    Within the past 12 months, the food you bought just didn't last and you didn't have money to get more.: Never true  Transportation Needs: No Transportation Needs (04/27/2024)   Received from [facility] - Transportation    In the past 12 months, has lack of transportation kept you from medical appointments or from getting medications?: No    Lack of Transportation (Non-Medical): No  Physical Activity: Not on file  Stress: Not on file  Social Connections: Moderately Integrated (10/05/2023)   Social Connection and Isolation Panel    Frequency of Communication with Friends and Family: More than three times a week    Frequency of Social Gatherings with Friends and Family: Twice a week    Attends Religious Services: Never     Database Administrator or Organizations: Yes    Attends Engineer, Structural: More than 4 times per year    Marital Status: Married  Catering Manager Violence: Not At Risk (10/05/2023)   Humiliation, Afraid, Rape, and Kick questionnaire    Fear of Current or Ex-Partner: No    Emotionally Abused: No    Physically Abused: No    Sexually Abused: No    Review of Systems: See HPI, otherwise negative ROS  Physical Exam: BP 138/78   Pulse (!) 56   Temp (!) 96.8 F (36 C) (Temporal)   Ht 5' 7.99 (1.727 m)   Wt 87.1 kg   SpO2 97%   BMI 29.20 kg/m  General:   Alert,  pleasant and cooperative in NAD Head:  Normocephalic and atraumatic. Lungs:  Clear to auscultation.    Heart:  Regular rate and rhythm.   Impression/Plan: Nancyann DELENA Pfeiffer is here  for ophthalmic surgery.  Risks, benefits, limitations, and alternatives regarding ophthalmic surgery have been reviewed with the patient.  Questions have been answered.  All parties agreeable.   MITTIE GASKIN, MD  08/22/2024, 7:30 AM
